# Patient Record
Sex: Male | Born: 1970 | Race: Black or African American | Hispanic: No | Marital: Single | Smoking: Never smoker
Health system: Southern US, Community
[De-identification: ages and names within clinical notes are randomized; demographics above are authoritative.]

## PROBLEM LIST (undated history)

## (undated) DIAGNOSIS — I10 Essential (primary) hypertension: Secondary | ICD-10-CM

## (undated) DIAGNOSIS — M1990 Unspecified osteoarthritis, unspecified site: Secondary | ICD-10-CM

## (undated) HISTORY — PX: OTHER SURGICAL HISTORY: SHX169

---

## 2017-10-18 ENCOUNTER — Ambulatory Visit: Payer: Self-pay | Admitting: Nurse Practitioner

## 2017-11-22 ENCOUNTER — Ambulatory Visit: Payer: Medicaid Other | Attending: Family Medicine | Admitting: Family Medicine

## 2017-11-22 ENCOUNTER — Encounter: Payer: Self-pay | Admitting: Family Medicine

## 2017-11-22 ENCOUNTER — Telehealth: Payer: Self-pay | Admitting: Family Medicine

## 2017-11-22 VITALS — BP 142/84 | HR 74 | Temp 97.5°F | Wt 155.6 lb

## 2017-11-22 DIAGNOSIS — M25521 Pain in right elbow: Secondary | ICD-10-CM

## 2017-11-22 DIAGNOSIS — Z1159 Encounter for screening for other viral diseases: Secondary | ICD-10-CM | POA: Diagnosis not present

## 2017-11-22 DIAGNOSIS — Z13228 Encounter for screening for other metabolic disorders: Secondary | ICD-10-CM | POA: Diagnosis not present

## 2017-11-22 DIAGNOSIS — M25522 Pain in left elbow: Secondary | ICD-10-CM

## 2017-11-22 DIAGNOSIS — M25551 Pain in right hip: Secondary | ICD-10-CM | POA: Diagnosis not present

## 2017-11-22 MED ORDER — MELOXICAM 7.5 MG PO TABS
7.5000 mg | ORAL_TABLET | Freq: Every day | ORAL | 1 refills | Status: DC
Start: 2017-11-22 — End: 2017-12-30

## 2017-11-22 MED FILL — MELOXICAM 7.5 MG TABLET: 7.5 | 30 days supply | Qty: 30 | Fill #0

## 2017-11-22 NOTE — Patient Instructions (Signed)

## 2017-11-23 ENCOUNTER — Encounter: Payer: Self-pay | Admitting: Family Medicine

## 2017-11-23 LAB — HIV ANTIBODY (ROUTINE TESTING W REFLEX): HIV Screen 4th Generation wRfx: NONREACTIVE

## 2017-11-23 LAB — CMP14+EGFR
ALBUMIN: 3.6 g/dL (ref 3.5–5.5)
ALT: 122 IU/L — AB (ref 0–44)
AST: 180 IU/L — ABNORMAL HIGH (ref 0–40)
Albumin/Globulin Ratio: 0.9 — ABNORMAL LOW (ref 1.2–2.2)
Alkaline Phosphatase: 136 IU/L — ABNORMAL HIGH (ref 39–117)
BUN/Creatinine Ratio: 9 (ref 9–20)
BUN: 15 mg/dL (ref 6–24)
Bilirubin Total: 0.4 mg/dL (ref 0.0–1.2)
CALCIUM: 9.3 mg/dL (ref 8.7–10.2)
CHLORIDE: 106 mmol/L (ref 96–106)
CO2: 21 mmol/L (ref 20–29)
Creatinine, Ser: 1.58 mg/dL — ABNORMAL HIGH (ref 0.76–1.27)
GFR calc non Af Amer: 51 mL/min/{1.73_m2} — ABNORMAL LOW (ref >59)
GFR, EST AFRICAN AMERICAN: 59 mL/min/{1.73_m2} — AB (ref >59)
GLUCOSE: 80 mg/dL (ref 65–99)
Globulin, Total: 3.9 g/dL (ref 1.5–4.5)
POTASSIUM: 4.1 mmol/L (ref 3.5–5.2)
Sodium: 142 mmol/L (ref 134–144)
TOTAL PROTEIN: 7.5 g/dL (ref 6.0–8.5)

## 2017-11-23 LAB — CBC WITH DIFFERENTIAL/PLATELET
BASOS: 1 %
Basophils Absolute: 0.1 10*3/uL (ref 0.0–0.2)
EOS (ABSOLUTE): 0.1 10*3/uL (ref 0.0–0.4)
Eos: 3 %
Hematocrit: 37.2 % — ABNORMAL LOW (ref 37.5–51.0)
Hemoglobin: 12.3 g/dL — ABNORMAL LOW (ref 13.0–17.7)
IMMATURE GRANULOCYTES: 0 %
Immature Grans (Abs): 0 10*3/uL (ref 0.0–0.1)
Lymphocytes Absolute: 2.6 10*3/uL (ref 0.7–3.1)
Lymphs: 55 %
MCH: 33.7 pg — AB (ref 26.6–33.0)
MCHC: 33.1 g/dL (ref 31.5–35.7)
MCV: 102 fL — ABNORMAL HIGH (ref 79–97)
MONOS ABS: 0.4 10*3/uL (ref 0.1–0.9)
Monocytes: 9 %
NEUTROS PCT: 32 %
Neutrophils Absolute: 1.5 10*3/uL (ref 1.4–7.0)
PLATELETS: 143 10*3/uL — AB (ref 150–450)
RBC: 3.65 x10E6/uL — ABNORMAL LOW (ref 4.14–5.80)
RDW: 14.8 % (ref 12.3–15.4)
WBC: 4.6 10*3/uL (ref 3.4–10.8)

## 2017-11-23 LAB — SEDIMENTATION RATE: SED RATE: 36 mm/h — AB (ref 0–15)

## 2017-11-23 LAB — URIC ACID: Uric Acid: 13.3 mg/dL — ABNORMAL HIGH (ref 3.7–8.6)

## 2017-11-23 LAB — C-REACTIVE PROTEIN: CRP: <1 mg/L (ref 0–10)

## 2017-11-23 MED ORDER — ALLOPURINOL 300 MG PO TABS: 300.0000 mg | ORAL_TABLET | Freq: Every day | ORAL | 6 refills | Status: DC

## 2017-11-23 NOTE — Progress Notes (Signed)
   Subjective:  Patient ID: Brandon Arias, male    DOB: 02-16-1971  Age: 47 y.o. MRN: [id number]  CC: New Patient (Initial Visit)   HPI Brandon Arias is a 47 year old male who presents today to establish care and is seen with the aid of a [place] video interpreter.  He complains of a 1 year history of bilateral elbow pain and right hip pain which has progressively worsened.  Pain is rated at a 7/10 and he informs me he previously had x-rays but forgot the report at home.  Denies a previous history of trauma, has no fevers.  History reviewed. No pertinent past medical history.  Past Surgical History:  Procedure Laterality Date  . no known surgeries      No Known Allergies  No outpatient medications prior to visit.   No facility-administered medications prior to visit.     ROS Review of Systems  Constitutional: Negative for activity change and appetite change.  HENT: Negative for sinus pressure and sore throat.   Eyes: Negative for visual disturbance.  Respiratory: Negative for cough, chest tightness and shortness of breath.   Cardiovascular: Negative for chest pain and leg swelling.  Gastrointestinal: Negative for abdominal distention, abdominal pain, constipation and diarrhea.  Endocrine: Negative.   Genitourinary: Negative for dysuria.  Musculoskeletal: Negative for joint swelling and myalgias.       See HPI  Skin: Negative for rash.  Allergic/Immunologic: Negative.   Neurological: Negative for weakness, light-headedness and numbness.  Psychiatric/Behavioral: Negative for dysphoric mood and suicidal ideas.    Objective:  BP (!) 142/84   Pulse 74   Temp (!) 97.5 F (36.4 C) (Oral)   Wt 155 lb 9.6 oz (70.6 kg)   SpO2 98%   BP/Weight 6/64/4034  Systolic BP 742  Diastolic BP 84  Wt. (Lbs) 155.6     Physical Exam  Constitutional: He is oriented to person, place, and time. He appears well-developed and well-nourished.  Cardiovascular: Normal rate, normal heart  sounds and intact distal pulses.  No murmur heard. Pulmonary/Chest: Effort normal and breath sounds normal. He has no wheezes. He has no rales. He exhibits no tenderness.  Abdominal: Soft. Bowel sounds are normal. He exhibits no distension and no mass. There is no tenderness.  Musculoskeletal: He exhibits tenderness (TTp on palpaton and ROM of posterior R hip joint).  Slightly tender bony protrusions of olecranon process with evidence of bursitis on left elbow  Neurological: He is alert and oriented to person, place, and time.     Assessment & Plan:   1. Screening for metabolic disorder - [id number]  2. Screening for viral disease - HIV antibody (with reflex)  3. Arthralgia of both elbows We will place him NSAID Advised to bring in report of previous x-ray - CBC with Differential/Platelet - Uric Acid - C-reactive protein - Sedimentation Rate  4. Right hip pain Possible underlying osteoarthritis We will await results of prior x-ray   Meds ordered this encounter  Medications  . meloxicam (MOBIC) 7.5 MG tablet    Sig: Take 1 tablet (7.5 mg total) by mouth daily.    Dispense:  30 tablet    Refill:  1    Follow-up: Return in about 1 month (around 12/23/2017).   Charlott Rakes MD

## 2017-11-23 NOTE — Addendum Note (Signed)
Addended by: Paschal DoppWHITE, VANESSA J on: 11/23/2017 08:52 AM   Modules accepted: Orders

## 2017-11-24 LAB — HEPATITIS PANEL, ACUTE
HEP A IGM: NEGATIVE
HEP B S AG: NEGATIVE
HEP C VIRUS AB: 0.5 {s_co_ratio} (ref 0.0–0.9)
Hep B C IgM: NEGATIVE

## 2017-11-24 LAB — SPECIMEN STATUS REPORT

## 2017-11-24 LAB — GAMMA GT: GGT: 506 IU/L — ABNORMAL HIGH (ref 0–65)

## 2017-11-25 ENCOUNTER — Ambulatory Visit: Payer: Self-pay | Admitting: Internal Medicine

## 2017-11-26 ENCOUNTER — Telehealth: Payer: Self-pay

## 2017-11-26 NOTE — Telephone Encounter (Signed)
Patient was called and informed of lab results and medication being sent to pharmacy.[phone])

## 2017-12-30 ENCOUNTER — Encounter: Payer: Self-pay | Admitting: Family Medicine

## 2017-12-30 ENCOUNTER — Ambulatory Visit: Payer: Medicaid Other | Attending: Family Medicine | Admitting: Family Medicine

## 2017-12-30 VITALS — BP 137/86 | HR 65 | Temp 97.6°F | Wt 155.2 lb

## 2017-12-30 DIAGNOSIS — M109 Gout, unspecified: Secondary | ICD-10-CM | POA: Insufficient documentation

## 2017-12-30 DIAGNOSIS — M1A0291 Idiopathic chronic gout, unspecified elbow, with tophus (tophi): Secondary | ICD-10-CM | POA: Diagnosis not present

## 2017-12-30 DIAGNOSIS — H538 Other visual disturbances: Secondary | ICD-10-CM | POA: Insufficient documentation

## 2017-12-30 DIAGNOSIS — M25521 Pain in right elbow: Secondary | ICD-10-CM | POA: Diagnosis present

## 2017-12-30 DIAGNOSIS — H539 Unspecified visual disturbance: Secondary | ICD-10-CM | POA: Diagnosis not present

## 2017-12-30 HISTORY — DX: Gout, unspecified: M10.9

## 2017-12-30 MED ORDER — ALLOPURINOL 300 MG PO TABS: 300.0000 mg | ORAL_TABLET | Freq: Every day | ORAL | 3 refills | Status: DC

## 2017-12-30 MED ORDER — MELOXICAM 7.5 MG PO TABS: 7.5000 mg | ORAL_TABLET | Freq: Every day | ORAL | 1 refills | Status: DC

## 2017-12-30 MED FILL — ALLOPURINOL 300 MG TAB: 300 | 30 days supply | Qty: 30 | Fill #0

## 2017-12-30 MED FILL — MELOXICAM 7.5 MG TABLET: 7.5 | 30 days supply | Qty: 30 | Fill #1

## 2017-12-30 NOTE — Patient Instructions (Addendum)
Gout Gout is painful swelling that can happen in some of your joints. Gout is a type of arthritis. This condition is caused by having too much uric acid in your body. Uric acid is a chemical that is made when your body breaks down substances called purines. If your body has too much uric acid, sharp crystals can form and build up in your joints. This causes pain and swelling. Gout attacks can happen quickly and be very painful (acute gout). Over time, the attacks can affect more joints and happen more often (chronic gout). Follow these instructions at home: During a Gout Attack  If directed, put ice on the painful area: ? Put ice in a plastic bag. ? Place a towel between your skin and the bag. ? Leave the ice on for 20 minutes, 2-3 times a day.  Rest the joint as much as possible. If the joint is in your leg, you may be given crutches to use.  Raise (elevate) the painful joint above the level of your heart as often as you can.  Drink enough fluids to keep your pee (urine) clear or pale yellow.  Take over-the-counter and prescription medicines only as told by your doctor.  Do not drive or use heavy machinery while taking prescription pain medicine.  Follow instructions from your doctor about what you can or cannot eat and drink.  Return to your normal activities as told by your doctor. Ask your doctor what activities are safe for you. Avoiding Future Gout Attacks  Follow a low-purine diet as told by a specialist (dietitian) or your doctor. Avoid foods and drinks that have a lot of purines, such as: ? Liver. ? Kidney. ? Anchovies. ? Asparagus. ? Herring. ? Mushrooms ? Mussels. ? Beer.  Limit alcohol intake to no more than 1 drink a day for nonpregnant women and 2 drinks a day for men. One drink equals 12 oz of beer, 5 oz of wine, or 1 oz of hard liquor.  Stay at a healthy weight or lose weight if you are overweight. If you want to lose weight, talk with your doctor. It is  important that you do not lose weight too fast.  Start or continue an exercise plan as told by your doctor.  Drink enough fluids to keep your pee clear or pale yellow.  Take over-the-counter and prescription medicines only as told by your doctor.  Keep all follow-up visits as told by your doctor. This is important. Contact a doctor if:  You have another gout attack.  You still have symptoms of a gout attack after10 days of treatment.  You have problems (side effects) because of your medicines.  You have chills or a fever.  You have burning pain when you pee (urinate).  You have pain in your lower back or belly. Get help right away if:  You have very bad pain.  Your pain cannot be controlled.  You cannot pee. This information is not intended to replace advice given to you by your health care provider. Make sure you discuss any questions you have with your health care provider. Document Released: 01/21/2008 Document Revised: 09/19/2015 Document Reviewed: 01/24/2015 Elsevier Interactive Patient Education  2018 Elsevier Inc. Low-Purine Diet Purines are compounds that affect the level of uric acid in your body. A low-purine diet is a diet that is low in purines. Eating a low-purine diet can prevent the level of uric acid in your body from getting too high and causing gout or kidney stones   or both. What do I need to know about this diet?  Choose low-purine foods. Examples of low-purine foods are listed in the next section.  Drink plenty of fluids, especially water. Fluids can help remove uric acid from your body. Try to drink 8-16 cups (1.9-3.8 L) a day.  Limit foods high in fat, especially saturated fat, as fat makes it harder for the body to get rid of uric acid. Foods high in saturated fat include pizza, cheese, ice cream, whole milk, fried foods, and gravies. Choose foods that are lower in fat and lean sources of protein. Use olive oil when cooking as it contains healthy fats  that are not high in saturated fat.  Limit alcohol. Alcohol interferes with the elimination of uric acid from your body. If you are having a gout attack, avoid all alcohol.  Keep in mind that different people's bodies react differently to different foods. You will probably learn over time which foods do or do not affect you. If you discover that a food tends to cause your gout to flare up, avoid eating that food. You can more freely enjoy foods that do not cause problems. If you have any questions about a food item, talk to your dietitian or health care provider. Which foods are low, moderate, and high in purines? The following is a list of foods that are low, moderate, and high in purines. You can eat any amount of the foods that are low in purines. You may be able to have small amounts of foods that are moderate in purines. Ask your health care provider how much of a food moderate in purines you can have. Avoid foods high in purines. Grains  Foods low in purines: Enriched white bread, pasta, rice, cake, cornbread, popcorn.  Foods moderate in purines: Whole-grain breads and cereals, wheat germ, bran, oatmeal. Uncooked oatmeal. Dry wheat bran or wheat germ.  Foods high in purines: Pancakes, French toast, biscuits, muffins. Vegetables  Foods low in purines: All vegetables, except those that are moderate in purines.  Foods moderate in purines: Asparagus, cauliflower, spinach, mushrooms, green peas. Fruits  All fruits are low in purines. Meats and other Protein Foods  Foods low in purines: Eggs, nuts, peanut butter.  Foods moderate in purines: 80-90% lean beef, lamb, veal, pork, poultry, fish, eggs, peanut butter, nuts. Crab, lobster, oysters, and shrimp. Cooked dried beans, peas, and lentils.  Foods high in purines: Anchovies, sardines, herring, mussels, tuna, codfish, scallops, trout, and haddock. Bacon. Organ meats (such as liver or kidney). Tripe. Game meat. Goose.  Sweetbreads. Dairy  All dairy foods are low in purines. Low-fat and fat-free dairy products are best because they are low in saturated fat. Beverages  Drinks low in purines: Water, carbonated beverages, tea, coffee, cocoa.  Drinks moderate in purines: Soft drinks and other drinks sweetened with high-fructose corn syrup. Juices. To find whether a food or drink is sweetened with high-fructose corn syrup, look at the ingredients list.  Drinks high in purines: Alcoholic beverages (such as beer). Condiments  Foods low in purines: Salt, herbs, olives, pickles, relishes, vinegar.  Foods moderate in purines: Butter, margarine, oils, mayonnaise. Fats and Oils  Foods low in purines: All types, except gravies and sauces made with meat.  Foods high in purines: Gravies and sauces made with meat. Other Foods  Foods low in purines: Sugars, sweets, gelatin. Cake. Soups made without meat.  Foods moderate in purines: Meat-based or fish-based soups, broths, or bouillons. Foods and drinks sweetened with high-fructose   corn syrup.  Foods high in purines: High-fat desserts (such as ice cream, cookies, cakes, pies, doughnuts, and chocolate). Contact your dietitian for more information on foods that are not listed here. This information is not intended to replace advice given to you by your health care provider. Make sure you discuss any questions you have with your health care provider. Document Released: 08/08/2010 Document Revised: 09/19/2015 Document Reviewed: 03/20/2013 Elsevier Interactive Patient Education  2017 Elsevier Inc.  

## 2017-12-30 NOTE — Progress Notes (Signed)
Subjective:  Patient ID: Brandon Arias, male    DOB: [birth year]  Age: 47 y.o. MRN: [id number]  CC: Elbow Pain   HPI Brandon Arias is a 47 year old whom I had seen 5 weeks ago for bilateral elbow pains and labs came back with an elevated uric acid level. He also had elevated LFTs and an elevated GGT. A prescription for Allopurinol had been sent to his pharmacy however he never picked this up but had been taking Meloxicam and complains of persisting elbow pains today described as moderate to severe. He also endorses alcohol consumption about once-twice/week. He  Complains of difficulty with night vision and blurry vision while reading.  History reviewed. No pertinent past medical history.  Past Surgical History:  Procedure Laterality Date  . no known surgeries      No Known Allergies   Outpatient Medications Prior to Visit  Medication Sig Dispense Refill  . allopurinol (ZYLOPRIM) 300 MG tablet Take 1 tablet (300 mg total) by mouth daily. 30 tablet 6  . meloxicam (MOBIC) 7.5 MG tablet Take 1 tablet (7.5 mg total) by mouth daily. 30 tablet 1   No facility-administered medications prior to visit.     ROS Review of Systems  Constitutional: Negative for activity change and appetite change.  HENT: Negative for sinus pressure and sore throat.   Eyes: Negative for visual disturbance.  Respiratory: Negative for cough, chest tightness and shortness of breath.   Cardiovascular: Negative for chest pain and leg swelling.  Gastrointestinal: Negative for abdominal distention, abdominal pain, constipation and diarrhea.  Endocrine: Negative.   Genitourinary: Negative for dysuria.  Musculoskeletal:       See hpi  Skin: Negative for rash.  Allergic/Immunologic: Negative.   Neurological: Negative for weakness, light-headedness and numbness.  Psychiatric/Behavioral: Negative for dysphoric mood and suicidal ideas.    Objective:  BP 137/86   Pulse 65   Temp 97.6 F (36.4 C) (Oral)    Wt 155 lb 3.2 oz (70.4 kg)   SpO2 99%   BP/Weight 12/30/2017 11/22/2017  Systolic BP 137 142  Diastolic BP 86 84  Wt. (Lbs) 155.2 155.6      Physical Exam  Constitutional: He is oriented to person, place, and time. He appears well-developed and well-nourished.  Cardiovascular: Normal rate, normal heart sounds and intact distal pulses.  No murmur heard. Pulmonary/Chest: Effort normal and breath sounds normal. He has no wheezes. He has no rales. He exhibits no tenderness.  Abdominal: Soft. Bowel sounds are normal. He exhibits no distension and no mass. There is no tenderness.  Musculoskeletal:  Tophi of both elbows; TTP b/l  Neurological: He is alert and oriented to person, place, and time.  Skin: Skin is warm and dry.  Psychiatric: He has a normal mood and affect.    Lab Results  Component Value Date   LABURIC 13.3 (H) 11/22/2017   Lab Results  Component Value Date   GGT 506 (H) 11/22/2017     Assessment & Plan:   1. 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  Dispense: 30 tablet; Refill: 3 - meloxicam (MOBIC) 7.5 MG tablet; Take 1 tablet (7.5 mg total) by mouth daily.  Dispense: 30 tablet; Refill: 1 - DG Elbow Complete Left; Future - DG Elbow Complete Right; Future  2.  Vision abnormalities - Ambulatory referral to Ophthalmology   Meds ordered this encounter  Medications  . allopurinol (ZYLOPRIM) 300 MG tablet    Sig: Take 1 tablet (300 mg total) by mouth daily.    Dispense:  30 tablet    Refill:  3  . meloxicam (MOBIC) 7.5 MG tablet    Sig: Take 1 tablet (7.5 mg total) by mouth daily.    Dispense:  30 tablet    Refill:  1    Follow-up: Return in about 6 weeks (around 02/10/2018) for follow Up on gout.   Hoy Register MD

## 2018-01-07 NOTE — Telephone Encounter (Signed)
No additional documentation

## 2018-02-09 ENCOUNTER — Ambulatory Visit: Payer: Medicaid Other | Attending: Family Medicine | Admitting: Physician Assistant

## 2018-02-09 VITALS — BP 128/74 | HR 63 | Temp 97.5°F | Wt 151.6 lb

## 2018-02-09 DIAGNOSIS — M1A0291 Idiopathic chronic gout, unspecified elbow, with tophus (tophi): Secondary | ICD-10-CM

## 2018-02-09 DIAGNOSIS — Z791 Long term (current) use of non-steroidal anti-inflammatories (NSAID): Secondary | ICD-10-CM | POA: Insufficient documentation

## 2018-02-09 DIAGNOSIS — M1A0290 Idiopathic chronic gout, unspecified elbow, without tophus (tophi): Secondary | ICD-10-CM | POA: Diagnosis not present

## 2018-02-09 DIAGNOSIS — M79672 Pain in left foot: Secondary | ICD-10-CM | POA: Diagnosis not present

## 2018-02-09 DIAGNOSIS — Z79899 Other long term (current) drug therapy: Secondary | ICD-10-CM | POA: Diagnosis not present

## 2018-02-09 NOTE — Progress Notes (Signed)
Patient ID: Brandon Arias, male   DOB: 02-16-1971, 47 y.o.   MRN: [id number]     Brandon Arias, is a 47 y.o. male  [id number]  [id number]  DOB - [birth year]  Subjective:  Chief Complaint and HPI: Brandon Arias is a 47 y.o. male here today for L foot pain that is ongoing since 2015.  meds helped some.  Diagnosed with gout.  Pain worse in the mornings.  Being treated as gout.  Pain is bt 4th and 5th metatarsals   Vincent with [employer].  L foot pain.  [employer] worker ROS:   Constitutional:  No f/c, No night sweats, No unexplained weight loss. EENT:  No vision changes, No blurry vision, No hearing changes. No mouth, throat, or ear problems.  Respiratory: No cough, No SOB Cardiac: No CP, no palpitations GI:  No abd pain, No N/V/D. GU: No Urinary s/sx Musculoskeletal: N+L foot pain Neuro: No headache, no dizziness, no motor weakness.  Skin: No rash Endocrine:  No polydipsia. No polyuria.  Psych: Denies SI/HI  No problems updated.  ALLERGIES: No Known Allergies  PAST MEDICAL HISTORY: History reviewed. No pertinent past medical history.  MEDICATIONS AT HOME: Prior to Admission medications   Medication Sig Start Date End Date Taking? Authorizing Provider  allopurinol (ZYLOPRIM) 300 MG tablet Take 1 tablet (300 mg total) by mouth daily. 12/30/17  Yes Hoy Register, MD  meloxicam (MOBIC) 7.5 MG tablet Take 1 tablet (7.5 mg total) by mouth daily. 12/30/17  Yes Hoy Register, MD     Objective:  EXAM:   Vitals:   02/09/18 1420  BP: 128/74  Pulse: 63  Temp: (!) 97.5 F (36.4 C)  TempSrc: Oral  SpO2: 100%  Weight: 151 lb 9.6 oz (68.8 kg)    General appearance : A&OX3. NAD. Non-toxic-appearing HEENT: Atraumatic and Normocephalic.  PERRLA. EOM intact.  Neck: supple, no JVD. No cervical lymphadenopathy. No thyromegaly Chest/Lungs:  Breathing-non-labored, Good air entry bilaterally, breath sounds normal without rales, rhonchi, or wheezing  CVS: S1  S2 regular, no murmurs, gallops, rubs  R and L foot examined.  There is no gross abnormality or erythema.  No visible swelling.  Passive ROM normal w/o pain. There is TTP bt/ with palpation of the distal 4th and 5th metatarsals.   Extremities: Bilateral Lower Ext shows no edema, both legs are warm to touch with = pulse throughout Neurology:  CN II-XII grossly intact, Non focal.   Psych:  TP linear. J/I WNL. Normal speech. Appropriate eye contact and affect.  Skin:  No Rash  Data Review No results found for: HGBA1C   Assessment & Plan   1. Left foot pain ? Neuroma/gout vs other - DG Foot 2 Views Left; Future - Ambulatory referral to Podiatry  2. 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  Use mobic if needed for pain.       Patient have been counseled extensively about nutrition and exercise  Return in about 3 months (around 05/12/2018) for Dr Alvis Lemmings; check up.  The patient was given clear instructions to go to ER or return to medical center if symptoms don't improve, worsen or new problems develop. The patient verbalized understanding. The patient was told to call to get lab results if they haven't heard anything in the next week.     Georgian Co, PA-C [facility] [place], [place] [phone]   02/09/2018, 2:31 PM

## 2018-02-09 NOTE — Patient Instructions (Signed)

## 2018-03-14 ENCOUNTER — Ambulatory Visit: Payer: Medicaid Other | Admitting: Podiatry

## 2018-03-21 ENCOUNTER — Encounter: Payer: Medicaid Other | Admitting: Podiatry

## 2018-03-23 NOTE — Progress Notes (Signed)
This encounter was created in error - please disregard.

## 2018-05-11 ENCOUNTER — Ambulatory Visit: Payer: Medicaid Other

## 2018-08-03 ENCOUNTER — Telehealth: Payer: Self-pay | Admitting: Family Medicine

## 2018-08-03 NOTE — Telephone Encounter (Signed)
He needs a telephone visit with any available Clinician

## 2018-08-03 NOTE — Telephone Encounter (Signed)
Pts son called in stating that pt had a bloody nose and employer wants a note stating that pt can go back to work pt's son states that his father is not sick please follow up

## 2018-08-03 NOTE — Telephone Encounter (Signed)
Will route to PCP for review. 

## 2018-08-04 ENCOUNTER — Encounter (HOSPITAL_COMMUNITY): Payer: Self-pay | Admitting: Emergency Medicine

## 2018-08-04 ENCOUNTER — Emergency Department (HOSPITAL_COMMUNITY)
Admission: EM | Admit: 2018-08-04 | Discharge: 2018-08-04 | Disposition: A | Discharge status: Home / Self Care | Payer: Medicaid Other | Attending: Emergency Medicine | Admitting: Emergency Medicine

## 2018-08-04 ENCOUNTER — Telehealth: Payer: Self-pay | Admitting: Family Medicine

## 2018-08-04 DIAGNOSIS — R040 Epistaxis: Secondary | ICD-10-CM | POA: Insufficient documentation

## 2018-08-04 DIAGNOSIS — Z79899 Other long term (current) drug therapy: Secondary | ICD-10-CM | POA: Insufficient documentation

## 2018-08-04 MED ORDER — OXYMETAZOLINE HCL 0.05 % NA SOLN
1.0000 | Freq: Once | NASAL | Status: AC
  Administered 2018-08-04: 14:00:00 1 via NASAL
  Filled 2018-08-04: qty 30

## 2018-08-04 NOTE — Telephone Encounter (Signed)
Can you set this patient up with a phone visit with any provider.

## 2018-08-04 NOTE — Telephone Encounter (Signed)
Patients call taken.  Patient identified by name and date of birth.  Patient states that he has had a bloody nose since Monday.  His employer sent him home and patient stated he needed a Doctors note to return to work.  Patient advised to go  To Urgent Care or the Emergency Department to be assessed.

## 2018-08-04 NOTE — ED Triage Notes (Signed)
Pt started having a nose bleed today at 1245. EMS gave afrin to help slow the bleed. Pt holding pressure, states he still feels it going down his throat. Pt had a recent nose bleed a few days ago as well. Complains of HA. BP 170/100, HR 70, resp 16

## 2018-08-04 NOTE — Discharge Instructions (Signed)
Your history and exam today are consistent with a nosebleed likely due to all the seasonal pollens and allergies.  Your nosebleed was able to stop after Afrin and pressure.  He did not need other intervention.  Please follow-up with ear nose and throat doctors if this nosebleed recurs and please return to the emergency department if it continues to bleed and does not stop.  Your vital signs are reassuring and otherwise your exam was unremarkable.

## 2018-08-04 NOTE — ED Notes (Signed)
No further bleeding noted after pt used afrin spray

## 2018-08-04 NOTE — ED Provider Notes (Signed)
[facility] Provider Note   CSN: [id number] Arrival date & time: 08/04/18  1357    History   Chief Complaint Chief Complaint  Patient presents with  . Epistaxis    HPI Brandon Arias is a 48 y.o. male.     The history is provided by the patient and medical records. The history is limited by a language barrier. A language interpreter was used (french).  Epistaxis  Location:  R nare Severity:  Moderate Duration:  1 day Timing:  Intermittent Progression:  Improving Chronicity:  Recurrent Context: weather change   Context: not anticoagulants   Relieved by:  Nothing Worsened by:  Nothing Ineffective treatments:  None tried Associated symptoms: no congestion, no cough, no fever, no headaches, no sinus pain, no sneezing, no sore throat and no syncope   Risk factors: allergies   Risk factors: no frequent nosebleeds     No past medical history on file.  Patient Active Problem List   Diagnosis Date Noted  . Gout 12/30/2017    Past Surgical History:  Procedure Laterality Date  . no known surgeries          Home Medications    Prior to Admission medications   Medication Sig Start Date End Date Taking? Authorizing Provider  allopurinol (ZYLOPRIM) 300 MG tablet Take 1 tablet (300 mg total) by mouth daily. 12/30/17   Hoy RegisterNewlin, Enobong, MD  meloxicam (MOBIC) 7.5 MG tablet Take 1 tablet (7.5 mg total) by mouth daily. 12/30/17   Hoy RegisterNewlin, Enobong, MD    Family History No family history on file.  Social History Social History   Tobacco Use  . Smoking status: Never Smoker  . Smokeless tobacco: Never Used  Substance Use Topics  . Alcohol use: Never    Frequency: Never  . Drug use: Never     Allergies   Patient has no known allergies.   Review of Systems Review of Systems  Constitutional: Negative for chills, diaphoresis, fatigue and fever.  HENT: Positive for nosebleeds. Negative for congestion, sinus pain, sneezing, sore  throat, tinnitus, trouble swallowing and voice change.   Eyes: Negative for visual disturbance.  Respiratory: Negative for cough.   Cardiovascular: Negative for chest pain and syncope.  Gastrointestinal: Negative for abdominal pain.  Genitourinary: Negative for flank pain.  Musculoskeletal: Negative for back pain, neck pain and neck stiffness.  Neurological: Negative for syncope, light-headedness and headaches.  All other systems reviewed and are negative.    Physical Exam Updated Vital Signs BP (!) 162/104 (BP Location: Right Arm)   Temp 99.3 F (37.4 C) (Oral)   Resp 16   SpO2 100%   Physical Exam Vitals signs and nursing note reviewed.  Constitutional:      General: He is not in acute distress.    Appearance: He is not ill-appearing, toxic-appearing or diaphoretic.  HENT:     Head:      Nose: No nasal deformity, septal deviation, laceration, congestion or rhinorrhea.     Right Nostril: Epistaxis present. No septal hematoma or occlusion.     Left Nostril: No epistaxis, septal hematoma or occlusion.     Mouth/Throat:     Pharynx: No posterior oropharyngeal erythema.     Comments: Blood in posterior oropharynx on arrival. Resolved on reassessment  Eyes:     Extraocular Movements: Extraocular movements intact.     Conjunctiva/sclera: Conjunctivae normal.     Pupils: Pupils are equal, round, and reactive to light.  Neck:  Musculoskeletal: Normal range of motion. No muscular tenderness.  Cardiovascular:     Rate and Rhythm: Normal rate.     Pulses: Normal pulses.     Heart sounds: No murmur.  Pulmonary:     Effort: Pulmonary effort is normal. No respiratory distress.     Breath sounds: No stridor. No wheezing, rhonchi or rales.  Chest:     Chest wall: No tenderness.  Abdominal:     General: Abdomen is flat. There is no distension.  Musculoskeletal:        General: No tenderness.  Skin:    Capillary Refill: Capillary refill takes less than 2 seconds.      Findings: No erythema or rash.  Neurological:     Mental Status: He is alert.      ED Treatments / Results  Labs (all labs ordered are listed, but only abnormal results are displayed) Labs Reviewed - No data to display  EKG None  Radiology No results found.  Procedures Procedures (including critical care time)  Medications Ordered in ED Medications  oxymetazoline (AFRIN) 0.05 % nasal spray 1 spray (1 spray Each Nare Given 08/04/18 1413)     Initial Impression / Assessment and Plan / ED Course  I have reviewed the triage vital signs and the nursing notes.  Pertinent labs & imaging results that were available during my care of the patient were reviewed by me and considered in my medical decision making (see chart for details).        Brandon Arias is a 48 y.o. male with no significant past medical history who presents with epistaxis.  Patient reports that he had recurrent epistaxis today after having several days ago.  He reports he has been having some runny nose as well.  He denies significant fevers, chills congestion, cough.  Denies recent trauma.  Denies any chest pain or shortness of breath.  No other complaints.  He denies any lightheadedness, syncope, palpitations.  He has never had significant nosebleeds in the past.  On exam, patient had mild epistaxis from his right nare.  Patient blew his nose and I was able to inspect his nose bilaterally.  There is no evidence of anterior nosebleed.  Suspect right posterior bleed as there was some blood going down the back of his throat.  Lungs clear chest nontender.  No stridor.  Patient otherwise reassuring on exam.  Patient had Afrin and pressure utilized.  Epistaxis resolved without needing for packing and he had no further bleeding during.  Observation.  Blood pressure had improved and patient was resting comfortably with no further bleeding.  He was able to ambulate and walk around without return of epistaxis.  Suspect  epistaxis related to blowing nose in the setting of allergies and seasonal changes.  Patient instructed to follow-up with otolaryngology if symptoms recur and persist.  If bleeding does not stop at home and it recurs, he is instructed to return to emergency department.  At this time do not feel patient is packing or other intervention.  Patient resting comfortably.  A [place] interpreter was used for interactions.  Patient agreed with plan of care and was discharged in good condition.    Final Clinical Impressions(s) / ED Diagnoses   Final diagnoses:  Right-sided epistaxis    ED Discharge Orders    None      Clinical Impression: 1. Right-sided epistaxis     Disposition: Discharge  Condition: Good  I have discussed the results, Dx and Tx plan with  the pt(& family if present). He/she/they expressed understanding and agree(s) with the plan. Discharge instructions discussed at great length. Strict return precautions discussed and pt &/or family have verbalized understanding of the instructions. No further questions at time of discharge.    New Prescriptions   No medications on file    Follow Up: Christia Reading, MD [id number] [place] [address] [phone]        Rashia Mckesson, Canary Brim, MD 08/04/18 [phone]

## 2018-08-04 NOTE — Telephone Encounter (Signed)
Patient called stating his nose is bleeding and he needs a letter to go back to work. Patient was advised to go to the ED or UC please follow up.

## 2018-08-08 ENCOUNTER — Ambulatory Visit: Payer: Medicaid Other | Attending: Primary Care | Admitting: Primary Care

## 2018-08-08 ENCOUNTER — Other Ambulatory Visit: Payer: Self-pay

## 2018-08-08 DIAGNOSIS — Z760 Encounter for issue of repeat prescription: Secondary | ICD-10-CM | POA: Diagnosis not present

## 2018-08-08 DIAGNOSIS — R040 Epistaxis: Secondary | ICD-10-CM | POA: Diagnosis present

## 2018-08-08 DIAGNOSIS — J302 Other seasonal allergic rhinitis: Secondary | ICD-10-CM | POA: Diagnosis not present

## 2018-08-08 DIAGNOSIS — R030 Elevated blood-pressure reading, without diagnosis of hypertension: Secondary | ICD-10-CM | POA: Diagnosis not present

## 2018-08-08 MED ORDER — SODIUM CHLORIDE 0.9 % IN AERS: 1.0000 | INHALATION_SPRAY | RESPIRATORY_TRACT | 12 refills | Status: DC | PRN

## 2018-08-08 MED ORDER — LORATADINE 10 MG PO TABS: 10.0000 mg | ORAL_TABLET | Freq: Every day | ORAL | 11 refills | Status: DC

## 2018-08-08 NOTE — Progress Notes (Signed)
Bloody nose/ started March and intermittent/ yesterday was the most recent/it's a lot and it fills the paper toilet/blocks it for 30-45 min and stops.   Pain: 0

## 2018-08-08 NOTE — Progress Notes (Signed)
Acute Office Visit  Subjective:    Patient ID: Brandon Arias, male    DOB: 08-10-1970, 48 y.o.   MRN: [id number]  Chief Complaint  Patient presents with  . Epistaxis  . Medication Refill    HPI Patient is having a tele health visit due COVID-19. Seen in ED for epistaxis.   No past medical history on file.  Past Surgical History:  Procedure Laterality Date  . no known surgeries      No family history on file.  Social History   Socioeconomic History  . Marital status: Married    Spouse name: Not on file  . Number of children: Not on file  . Years of education: Not on file  . Highest education level: Not on file  Occupational History  . Not on file  Social Needs  . Financial resource strain: Not on file  . Food insecurity:    Worry: Not on file    Inability: Not on file  . Transportation needs:    Medical: Not on file    Non-medical: Not on file  Tobacco Use  . Smoking status: Never Smoker  . Smokeless tobacco: Never Used  Substance and Sexual Activity  . Alcohol use: Yes    Frequency: Never    Comment: occ  . Drug use: Never  . Sexual activity: Not on file  Lifestyle  . Physical activity:    Days per week: Not on file    Minutes per session: Not on file  . Stress: Not on file  Relationships  . Social connections:    Talks on phone: Not on file    Gets together: Not on file    Attends religious service: Not on file    Active member of club or organization: Not on file    Attends meetings of clubs or organizations: Not on file    Relationship status: Not on file  . Intimate partner violence:    Fear of current or ex partner: Not on file    Emotionally abused: Not on file    Physically abused: Not on file    Forced sexual activity: Not on file  Other Topics Concern  . Not on file  Social History Narrative  . Not on file    Outpatient Medications Prior to Visit  Medication Sig Dispense Refill  . ibuprofen (ADVIL,MOTRIN) 200 MG tablet Take  200 mg by mouth every 6 (six) hours as needed.    Marland Kitchen allopurinol (ZYLOPRIM) 300 MG tablet Take 1 tablet (300 mg total) by mouth daily. (Patient not taking: Reported on 08/08/2018) 30 tablet 3  . meloxicam (MOBIC) 7.5 MG tablet Take 1 tablet (7.5 mg total) by mouth daily. (Patient not taking: Reported on 08/08/2018) 30 tablet 1   No facility-administered medications prior to visit.     No Known Allergies  Review of Systems  HENT: Positive for nosebleeds.   Eyes: Negative.   Respiratory: Positive for cough.   Cardiovascular: Negative.   Gastrointestinal: Negative.   Genitourinary: Negative.   Musculoskeletal: Negative.   Neurological: Negative.   Endo/Heme/Allergies: Negative.   Psychiatric/Behavioral: Negative.        Objective:    Physical Exam  There were no vitals taken for this visit. Wt Readings from Last 3 Encounters:  02/09/18 151 lb 9.6 oz (68.8 kg)  12/30/17 155 lb 3.2 oz (70.4 kg)  11/22/17 155 lb 9.6 oz (70.6 kg)    There are no preventive care reminders to display for this patient.  There are no preventive care reminders to display for this patient.   No results found for: TSH Lab Results  Component Value Date   WBC 4.6 11/22/2017   HGB 12.3 (L) 11/22/2017   HCT 37.2 (L) 11/22/2017   MCV 102 (H) 11/22/2017   PLT 143 (L) 11/22/2017   Lab Results  Component Value Date   NA 142 11/22/2017   K 4.1 11/22/2017   CO2 21 11/22/2017   GLUCOSE 80 11/22/2017   BUN 15 11/22/2017   CREATININE 1.58 (H) 11/22/2017   BILITOT 0.4 11/22/2017   ALKPHOS 136 (H) 11/22/2017   AST 180 (H) 11/22/2017   ALT 122 (H) 11/22/2017   PROT 7.5 11/22/2017   ALBUMIN 3.6 11/22/2017   CALCIUM 9.3 11/22/2017   No results found for: CHOL No results found for: HDL No results found for: LDLCALC No results found for: TRIG No results found for: CHOLHDL No results found for: [id number]     Assessment & Plan:   Problem List Items Addressed This Visit    None    Cheryll Cockaynedouard was seen  today for epistaxis and medication refill.  Diagnoses and all orders for this visit:  Seasonal allergies -     loratadine (CLARITIN) 10 MG tablet; Take 1 tablet (10 mg total) by mouth daily. -     sodium chloride (BRONCHO SALINE) inhaler solution; Take 1 spray by nebulization as needed.  Epistaxis  Several episodes started from March went to ED . Bp slightly elevated.  Elevated blood pressure reading without diagnosis of hypertension BP from ED notes 162/104   Patient requesting rtn to work from ED visit till today visit rtn on Wednesday   No orders of the defined types were placed in this encounter.    Grayce SessionsMichelle P Georgiana Spillane, NP

## 2018-08-11 ENCOUNTER — Telehealth: Payer: Self-pay | Admitting: Family Medicine

## 2018-08-11 NOTE — Telephone Encounter (Signed)
Patient called to inform provider that medications are not helping him, patient states he is still having bloody noses. Please follow up.

## 2018-08-11 NOTE — Telephone Encounter (Signed)
Patient called again requesting a letter stating that he is excused from work for 04/16/,04/17 and 04/20. Please follow up.

## 2018-08-12 ENCOUNTER — Other Ambulatory Visit: Payer: Self-pay | Admitting: Family Medicine

## 2018-08-12 NOTE — Progress Notes (Signed)
I have written a letter for him. Thanks.

## 2018-08-12 NOTE — Telephone Encounter (Signed)
I will provide his letter when I am back in the office. Advise to continue current nasal spray and apply pressure to the nasal bridge to stop bleeding.

## 2018-08-15 ENCOUNTER — Encounter: Payer: Self-pay | Admitting: *Deleted

## 2018-08-15 NOTE — Telephone Encounter (Signed)
Left message on voicemail by assist of  [employer], [place] # [phone], that letter is at front desk. May pick up at convenience.

## 2018-12-23 ENCOUNTER — Other Ambulatory Visit: Payer: Self-pay | Admitting: Podiatry

## 2018-12-23 ENCOUNTER — Other Ambulatory Visit: Payer: Self-pay

## 2018-12-23 ENCOUNTER — Ambulatory Visit (INDEPENDENT_AMBULATORY_CARE_PROVIDER_SITE_OTHER): Payer: Medicaid Other | Admitting: Podiatry

## 2018-12-23 ENCOUNTER — Encounter: Payer: Self-pay | Admitting: Podiatry

## 2018-12-23 ENCOUNTER — Ambulatory Visit (INDEPENDENT_AMBULATORY_CARE_PROVIDER_SITE_OTHER): Payer: Medicaid Other

## 2018-12-23 VITALS — BP 149/83

## 2018-12-23 DIAGNOSIS — M79672 Pain in left foot: Secondary | ICD-10-CM

## 2018-12-23 DIAGNOSIS — M79671 Pain in right foot: Secondary | ICD-10-CM

## 2018-12-23 DIAGNOSIS — M7751 Other enthesopathy of right foot: Secondary | ICD-10-CM

## 2018-12-23 DIAGNOSIS — M7752 Other enthesopathy of left foot: Secondary | ICD-10-CM | POA: Diagnosis not present

## 2018-12-23 DIAGNOSIS — M779 Enthesopathy, unspecified: Secondary | ICD-10-CM

## 2018-12-23 MED ORDER — DICLOFENAC SODIUM 75 MG PO TBEC: 75.0000 mg | DELAYED_RELEASE_TABLET | Freq: Two times a day (BID) | ORAL | 2 refills | Status: DC

## 2018-12-23 MED FILL — DICLOFENAC SOD EC 75 MG TAB: 75 | 25 days supply | Qty: 50 | Fill #0

## 2018-12-26 NOTE — Progress Notes (Signed)
Subjective:   Patient ID: Brandon Arias, male   DOB: 48 y.o.   MRN: [id number]   HPI Patient presents with interpreter with chronic discomfort in the ankle region bilateral and does work a job where he stands all day and states this is been going on for a while.  Patient does not smoke likes to be active   Review of Systems  All other systems reviewed and are negative.       Objective:  Physical Exam Vitals signs and nursing note reviewed.  Constitutional:      Appearance: He is well-developed.  Pulmonary:     Effort: Pulmonary effort is normal.  Musculoskeletal: Normal range of motion.  Skin:    General: Skin is warm.  Neurological:     Mental Status: He is alert.     Neurovascular status found to be intact muscle strength found to be adequate range of motion was adequate patient found to have quite a bit of discomfort in the sinus tarsi bilateral with inflammation fluid around the area and mild discomfort on the inside of the ankle bilateral     Assessment:  Inflammatory capsulitis of the sinus tarsi bilateral with fluid buildup     Plan:  Did sterile prep bilateral and injected the sinus tarsi bilateral 3 mg Kenalog 5 mg Xylocaine and instructed on physical therapy anti-inflammatory supportive therapy and will be seen back on an as-needed basis  X-ray indicated that there is good alignment with no signs of significant pathology

## 2019-05-11 ENCOUNTER — Ambulatory Visit: Payer: Medicaid Other | Attending: Family Medicine | Admitting: Internal Medicine

## 2019-05-11 ENCOUNTER — Other Ambulatory Visit: Payer: Self-pay

## 2019-05-11 ENCOUNTER — Encounter: Payer: Self-pay | Admitting: Internal Medicine

## 2019-05-11 VITALS — BP 155/91 | HR 71 | Temp 98.0°F | Resp 16 | Wt 162.0 lb

## 2019-05-11 DIAGNOSIS — M79642 Pain in left hand: Secondary | ICD-10-CM

## 2019-05-11 DIAGNOSIS — Z79899 Other long term (current) drug therapy: Secondary | ICD-10-CM | POA: Diagnosis not present

## 2019-05-11 DIAGNOSIS — M109 Gout, unspecified: Secondary | ICD-10-CM | POA: Insufficient documentation

## 2019-05-11 DIAGNOSIS — Z791 Long term (current) use of non-steroidal anti-inflammatories (NSAID): Secondary | ICD-10-CM | POA: Insufficient documentation

## 2019-05-11 MED ORDER — PREDNISONE 20 MG PO TABS: ORAL_TABLET | ORAL | 0 refills | Status: DC

## 2019-05-11 MED ORDER — DOXYCYCLINE HYCLATE 100 MG PO TABS: 100.0000 mg | ORAL_TABLET | Freq: Two times a day (BID) | ORAL | 0 refills | Status: DC

## 2019-05-11 MED ORDER — AMOXICILLIN 500 MG PO CAPS: 500.0000 mg | ORAL_CAPSULE | Freq: Three times a day (TID) | ORAL | 0 refills | Status: DC

## 2019-05-11 MED FILL — AMOXICILLIN 500 MG CAPSULE: 500 | 7 days supply | Qty: 21 | Fill #0

## 2019-05-11 MED FILL — predniSONE 20 MG TABS: 20 | 8 days supply | Qty: 10 | Fill #0

## 2019-05-11 MED FILL — DOXYCYCLINE HYCLATE 100 MG: 100 | 7 days supply | Qty: 14 | Fill #0

## 2019-05-11 NOTE — Progress Notes (Signed)
Patient ID: Brandon Arias, male    DOB: [birth year]  MRN: [id number]  CC: Edema (left hand )   Subjective: Brandon Arias is a 49 y.o. male who presents for UC visit for swelling and pain LT hand. His concerns today include:   Patient works at a [employer].  He was recently sent home from work and told not to return until he has his LT hand evaluated. - started 6 days ago as pain in he hand then swelling started 2 days later. No fever or redness.  Rates pain 9/10. No known injury. No insect bite.  -takes Ibuprofen which does no help.  He has history of gout.  States he had swelling in his hand sometime last year and was told that it was not gout. Patient Active Problem List   Diagnosis Date Noted  . Gout 12/30/2017     Current Outpatient Medications on File Prior to Visit  Medication Sig Dispense Refill  . allopurinol (ZYLOPRIM) 300 MG tablet Take 1 tablet (300 mg total) by mouth daily. 30 tablet 3  . diclofenac (VOLTAREN) 75 MG EC tablet Take 1 tablet (75 mg total) by mouth 2 (two) times daily. 50 tablet 2  . ibuprofen (ADVIL,MOTRIN) 200 MG tablet Take 200 mg by mouth every 6 (six) hours as needed.    . loratadine (CLARITIN) 10 MG tablet Take 1 tablet (10 mg total) by mouth daily. 30 tablet 11  . meloxicam (MOBIC) 7.5 MG tablet Take 1 tablet (7.5 mg total) by mouth daily. 30 tablet 1  . sodium chloride (BRONCHO SALINE) inhaler solution Take 1 spray by nebulization as needed. 90 mL 12   No current facility-administered medications on file prior to visit.    No Known Allergies  Social History   Socioeconomic History  . Marital status: Married    Spouse name: Not on file  . Number of children: Not on file  . Years of education: Not on file  . Highest education level: Not on file  Occupational History  . Not on file  Tobacco Use  . Smoking status: Never Smoker  . Smokeless tobacco: Never Used  Substance and Sexual Activity  . Alcohol use: Yes   Comment: occ  . Drug use: Never  . Sexual activity: Not on file  Other Topics Concern  . Not on file  Social History Narrative  . Not on file   Social Determinants of Health   Financial Resource Strain:   . Difficulty of Paying Living Expenses: Not on file  Food Insecurity:   . Worried About Charity fundraiser in the Last Year: Not on file  . Ran Out of Food in the Last Year: Not on file  Transportation Needs:   . Lack of Transportation (Medical): Not on file  . Lack of Transportation (Non-Medical): Not on file  Physical Activity:   . Days of Exercise per Week: Not on file  . Minutes of Exercise per Session: Not on file  Stress:   . Feeling of Stress : Not on file  Social Connections:   . Frequency of Communication with Friends and Family: Not on file  . Frequency of Social Gatherings with Friends and Family: Not on file  . Attends Religious Services: Not on file  . Active Member of Clubs or Organizations: Not on file  . Attends Archivist Meetings: Not on file  . Marital Status: Not on file  Intimate Partner Violence:   . Fear of Current  or Ex-Partner: Not on file  . Emotionally Abused: Not on file  . Physically Abused: Not on file  . Sexually Abused: Not on file    No family history on file.  Past Surgical History:  Procedure Laterality Date  . no known surgeries      ROS: Review of Systems Negative except as stated above  PHYSICAL EXAM: BP (!) 155/91   Pulse 71   Temp 98 F (36.7 C)   Resp 16   Wt 162 lb (73.5 kg)   SpO2 99%   Physical Exam General appearance - alert, well appearing, middle age male and in no distress Mental status - normal mood, behavior, speech, dress, motor activity, and thought processes Musculoskeletal - LT hand: Patient noted to have mild to moderate swelling on the dorsal surface.  He has mild swelling in the second through the fourth fingers and moderate swelling of the fifth finger.  Fifth finger is exquisitely tender  to touch and sensitive to passive movement.  There is tenderness on palpation of the dorsal surface of the hand.  There is increased warmth on the dorsal surface.  Erythema hard to detect given patient's skin color.  Mild discomfort on passive range of motion of the wrist.  CMP Latest Ref Rng & Units 11/22/2017  Glucose 65 - 99 mg/dL 80  BUN 6 - 24 mg/dL 15  Creatinine 2.95 - 2.84 mg/dL 1.32(G)  Sodium 401 - 027 mmol/L 142  Potassium 3.5 - 5.2 mmol/L 4.1  Chloride 96 - 106 mmol/L 106  CO2 20 - 29 mmol/L 21  Calcium 8.7 - 10.2 mg/dL 9.3  Total Protein 6.0 - 8.5 g/dL 7.5  Total Bilirubin 0.0 - 1.2 mg/dL 0.4  Alkaline Phos 39 - 117 IU/L 136(H)  AST 0 - 40 IU/L 180(H)  ALT 0 - 44 IU/L 122(H)   Lipid Panel  No results found for: CHOL, TRIG, HDL, CHOLHDL, VLDL, LDLCALC, LDLDIRECT  CBC    Component Value Date/Time   WBC 4.6 11/22/2017 1111   RBC 3.65 (L) 11/22/2017 1111   HGB 12.3 (L) 11/22/2017 1111   HCT 37.2 (L) 11/22/2017 1111   PLT 143 (L) 11/22/2017 1111   MCV 102 (H) 11/22/2017 1111   MCH 33.7 (H) 11/22/2017 1111   MCHC 33.1 11/22/2017 1111   RDW 14.8 11/22/2017 1111   LYMPHSABS 2.6 11/22/2017 1111   EOSABS 0.1 11/22/2017 1111   BASOSABS 0.1 11/22/2017 1111    ASSESSMENT AND PLAN: 1. Pain of left hand Differential diagnosis here is cellulitis versus acute gout flare. I will put him on tapering prednisone and cover him for cellulitis with amoxicillin and doxycycline.  Patient advised to be seen in the emergency room if this is not improving or gets worse despite being on these medications I have filled out the form that he brought from his job.  I recommend that he remain out of work for at least 1 week.  He will follow-up with his PCP in 1 week - predniSONE (DELTASONE) 20 MG tablet; 2 tabs daily x 2 days then 1.5 tab PO daily x 2 days then 1 tab x 2 days then 1/2 tab x 2 days.  Dispense: 10 tablet; Refill: 0 - amoxicillin (AMOXIL) 500 MG capsule; Take 1 capsule (500 mg  total) by mouth 3 (three) times daily.  Dispense: 21 capsule; Refill: 0 - doxycycline (VIBRA-TABS) 100 MG tablet; Take 1 tablet (100 mg total) by mouth 2 (two) times daily.  Dispense: 14 tablet; Refill: 0  Patient was given the opportunity to ask questions.  Patient verbalized understanding of the plan and was able to repeat key elements of the plan.  [employer] interpreter used during this encounter. #[id number]  No orders of the defined types were placed in this encounter.    Requested Prescriptions   Signed Prescriptions Disp Refills  . predniSONE (DELTASONE) 20 MG tablet 10 tablet 0    Sig: 2 tabs daily x 2 days then 1.5 tab PO daily x 2 days then 1 tab x 2 days then 1/2 tab x 2 days.  Marland Kitchen amoxicillin (AMOXIL) 500 MG capsule 21 capsule 0    Sig: Take 1 capsule (500 mg total) by mouth 3 (three) times daily.  Marland Kitchen doxycycline (VIBRA-TABS) 100 MG tablet 14 tablet 0    Sig: Take 1 tablet (100 mg total) by mouth 2 (two) times daily.    No follow-ups on file.  Jonah Blue, MD, FACP

## 2019-05-11 NOTE — Progress Notes (Deleted)
Virtual Visit via Telephone Note  I connected with [name] on 05/11/19 at  9:50 AM EST by telephone and verified that I am speaking with the correct person using two identifiers.   I discussed the limitations, risks, security and privacy concerns of performing an evaluation and management service by telephone and the availability of in person appointments. I also discussed with the patient that there may be a patient responsible charge related to this service. The patient expressed understanding and agreed to proceed.  PATIENT visit by telephone virtually in the context of Covid-19 pandemic. Patient location: My Location:  [facility] office Persons on the call:    History of Present Illness:    Observations/Objective:   Assessment and Plan:   Follow Up Instructions:    I discussed the assessment and treatment plan with the patient. The patient was provided an opportunity to ask questions and all were answered. The patient agreed with the plan and demonstrated an understanding of the instructions.   The patient was advised to call back or seek an in-person evaluation if the symptoms worsen or if the condition fails to improve as anticipated.  I provided *** minutes of non-face-to-face time during this encounter.   Brandon Co, PA-C  Patient ID: Brandon Arias, male   DOB: 02-Feb-1971, 49 y.o.   MRN: [id number]

## 2019-05-23 ENCOUNTER — Ambulatory Visit: Payer: Medicaid Other | Attending: Family Medicine | Admitting: Family Medicine

## 2019-05-23 ENCOUNTER — Encounter: Payer: Self-pay | Admitting: Family Medicine

## 2019-05-23 ENCOUNTER — Other Ambulatory Visit: Payer: Self-pay

## 2019-05-23 VITALS — BP 130/80 | HR 78 | Wt 153.0 lb

## 2019-05-23 DIAGNOSIS — M79642 Pain in left hand: Secondary | ICD-10-CM | POA: Insufficient documentation

## 2019-05-23 DIAGNOSIS — Z7952 Long term (current) use of systemic steroids: Secondary | ICD-10-CM | POA: Diagnosis not present

## 2019-05-23 DIAGNOSIS — M1A0291 Idiopathic chronic gout, unspecified elbow, with tophus (tophi): Secondary | ICD-10-CM

## 2019-05-23 DIAGNOSIS — M1A9XX1 Chronic gout, unspecified, with tophus (tophi): Secondary | ICD-10-CM | POA: Diagnosis not present

## 2019-05-23 DIAGNOSIS — Z791 Long term (current) use of non-steroidal anti-inflammatories (NSAID): Secondary | ICD-10-CM | POA: Diagnosis not present

## 2019-05-23 DIAGNOSIS — Z23 Encounter for immunization: Secondary | ICD-10-CM | POA: Insufficient documentation

## 2019-05-23 DIAGNOSIS — Z0000 Encounter for general adult medical examination without abnormal findings: Secondary | ICD-10-CM

## 2019-05-23 MED ORDER — ALLOPURINOL 300 MG PO TABS: 300.0000 mg | ORAL_TABLET | Freq: Every day | ORAL | 6 refills | Status: DC

## 2019-05-23 MED FILL — ALLOPURINOL 300 MG TAB: 300 | 30 days supply | Qty: 30 | Fill #0

## 2019-05-23 NOTE — Patient Instructions (Signed)

## 2019-05-23 NOTE — Progress Notes (Signed)
Follow up for hand pain.

## 2019-05-23 NOTE — Progress Notes (Signed)
Subjective:  Patient ID: Brandon Arias, male    DOB: 1970-08-24  Age: 49 y.o. MRN: [id number]  CC: Hand Pain   HPI Brandon Arias is a 49 year old male with a history of gout who was seen by Dr. Wynetta Emery 2 weeks ago for left hand pain which was treated with a course of prednisone, doxycycline and amoxicillin. Works at a [employer], manufacturing systems His left hand pain and swelling improved with the course of Prednisone and antibiotics but his left 5th finger is still swollen. Pain is described as 7/10.  Denies presence of fever.  His chart reveals a history of gout and he was last on allopurinol in 2019. He would need a note extending his stay out of work till 05/28/2018.  Past Medical History:  Diagnosis Date  . Gout 12/30/2017     Past Surgical History:  Procedure Laterality Date  . no known surgeries      No family history on file.  No Known Allergies  Outpatient Medications Prior to Visit  Medication Sig Dispense Refill  . allopurinol (ZYLOPRIM) 300 MG tablet Take 1 tablet (300 mg total) by mouth daily. 30 tablet 3  . diclofenac (VOLTAREN) 75 MG EC tablet Take 1 tablet (75 mg total) by mouth 2 (two) times daily. 50 tablet 2  . ibuprofen (ADVIL,MOTRIN) 200 MG tablet Take 200 mg by mouth every 6 (six) hours as needed.    . loratadine (CLARITIN) 10 MG tablet Take 1 tablet (10 mg total) by mouth daily. 30 tablet 11  . meloxicam (MOBIC) 7.5 MG tablet Take 1 tablet (7.5 mg total) by mouth daily. 30 tablet 1  . sodium chloride (BRONCHO SALINE) inhaler solution Take 1 spray by nebulization as needed. 90 mL 12  . amoxicillin (AMOXIL) 500 MG capsule Take 1 capsule (500 mg total) by mouth 3 (three) times daily. (Patient not taking: Reported on 05/23/2019) 21 capsule 0  . doxycycline (VIBRA-TABS) 100 MG tablet Take 1 tablet (100 mg total) by mouth 2 (two) times daily. (Patient not taking: Reported on 05/23/2019) 14 tablet 0  . predniSONE (DELTASONE) 20 MG tablet 2 tabs daily x 2 days then 1.5 tab PO  daily x 2 days then 1 tab x 2 days then 1/2 tab x 2 days. (Patient not taking: Reported on 05/23/2019) 10 tablet 0   No facility-administered medications prior to visit.     ROS Review of Systems  Constitutional: Negative for activity change and appetite change.  HENT: Negative for sinus pressure and sore throat.   Eyes: Negative for visual disturbance.  Respiratory: Negative for cough, chest tightness and shortness of breath.   Cardiovascular: Negative for chest pain and leg swelling.  Gastrointestinal: Negative for abdominal distention, abdominal pain, constipation and diarrhea.  Endocrine: Negative.   Genitourinary: Negative for dysuria.  Musculoskeletal:       See HPI  Skin: Negative for rash.  Allergic/Immunologic: Negative.   Neurological: Negative for weakness, light-headedness and numbness.  Psychiatric/Behavioral: Negative for dysphoric mood and suicidal ideas.    Objective:  BP 130/80   Pulse 78   Wt 153 lb (69.4 kg)   SpO2 97%   BP/Weight 05/23/2019 05/11/2019 7/41/6384  Systolic BP 536 468 032  Diastolic BP 80 91 83  Wt. (Lbs) 153 162 -      Physical Exam Constitutional:      Appearance: He is well-developed.  Neck:     Vascular: No JVD.  Cardiovascular:     Rate and Rhythm: Normal rate.  Heart sounds: Normal heart sounds. No murmur.  Pulmonary:     Effort: Pulmonary effort is normal.     Breath sounds: Normal breath sounds. No wheezing or rales.  Chest:     Chest wall: No tenderness.  Abdominal:     General: Bowel sounds are normal. There is no distension.     Palpations: Abdomen is soft. There is no mass.     Tenderness: There is no abdominal tenderness.  Musculoskeletal:        General: Normal range of motion.     Right lower leg: No edema.     Left lower leg: No edema.     Comments: Edema of left fifth finger with difficulty flexing Left hand is normal  Neurological:     Mental Status: He is alert and oriented to person, place, and time.    Psychiatric:        Mood and Affect: Mood normal.     CMP Latest Ref Rng & Units 11/22/2017  Glucose 65 - 99 mg/dL 80  BUN 6 - 24 mg/dL 15  Creatinine 0.76 - 1.27 mg/dL 1.58(H)  Sodium 134 - 144 mmol/L 142  Potassium 3.5 - 5.2 mmol/L 4.1  Chloride 96 - 106 mmol/L 106  CO2 20 - 29 mmol/L 21  Calcium 8.7 - 10.2 mg/dL 9.3  Total Protein 6.0 - 8.5 g/dL 7.5  Total Bilirubin 0.0 - 1.2 mg/dL 0.4  Alkaline Phos 39 - 117 IU/L 136(H)  AST 0 - 40 IU/L 180(H)  ALT 0 - 44 IU/L 122(H)    Lipid Panel  No results found for: CHOL, TRIG, HDL, CHOLHDL, VLDL, LDLCALC, LDLDIRECT  CBC    Component Value Date/Time   WBC 4.6 11/22/2017 1111   RBC 3.65 (L) 11/22/2017 1111   HGB 12.3 (L) 11/22/2017 1111   HCT 37.2 (L) 11/22/2017 1111   PLT 143 (L) 11/22/2017 1111   MCV 102 (H) 11/22/2017 1111   MCH 33.7 (H) 11/22/2017 1111   MCHC 33.1 11/22/2017 1111   RDW 14.8 11/22/2017 1111   LYMPHSABS 2.6 11/22/2017 1111   EOSABS 0.1 11/22/2017 1111   BASOSABS 0.1 11/22/2017 1111    No results found for: HGBA1C  Assessment & Plan:   1. 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  Dispense: 30 tablet; Refill: 6  2. Pain of left hand Improving Completed course of prednisone and antibiotics Could be explained by Gout Provided note for work to return on 05/29/2019 - allopurinol (ZYLOPRIM) 300 MG tablet; Take 1 tablet (300 mg total) by mouth daily.  Dispense: 30 tablet; Refill: 6  3. Healthcare maintenance - Flu Vaccine QUAD 36+ mos IM   Return in about 6 months (around 11/20/2019) for chronic medical conditions.   Charlott Rakes, MD, FAAFP. [facility] [place], [place]   05/23/2019, 10:24 AM

## 2019-05-24 ENCOUNTER — Other Ambulatory Visit: Payer: Self-pay | Admitting: Family Medicine

## 2019-05-24 ENCOUNTER — Encounter: Payer: Self-pay | Admitting: Family Medicine

## 2019-05-24 DIAGNOSIS — R7989 Other specified abnormal findings of blood chemistry: Secondary | ICD-10-CM

## 2019-05-24 LAB — CMP14+EGFR
ALT: 123 IU/L — ABNORMAL HIGH (ref 0–44)
AST: 196 IU/L — ABNORMAL HIGH (ref 0–40)
Albumin/Globulin Ratio: 0.8 — ABNORMAL LOW (ref 1.2–2.2)
Albumin: 3.8 g/dL — ABNORMAL LOW (ref 4.0–5.0)
Alkaline Phosphatase: 146 IU/L — ABNORMAL HIGH (ref 39–117)
BUN/Creatinine Ratio: 25 — ABNORMAL HIGH (ref 9–20)
BUN: 19 mg/dL (ref 6–24)
Bilirubin Total: 0.8 mg/dL (ref 0.0–1.2)
CO2: 22 mmol/L (ref 20–29)
Calcium: 9 mg/dL (ref 8.7–10.2)
Chloride: 101 mmol/L (ref 96–106)
Creatinine, Ser: 0.77 mg/dL (ref 0.76–1.27)
GFR calc Af Amer: 124 mL/min/{1.73_m2} (ref >59)
GFR calc non Af Amer: 107 mL/min/{1.73_m2} (ref >59)
Globulin, Total: 5 g/dL — ABNORMAL HIGH (ref 1.5–4.5)
Glucose: 71 mg/dL (ref 65–99)
Potassium: 4.6 mmol/L (ref 3.5–5.2)
Sodium: 140 mmol/L (ref 134–144)
Total Protein: 8.8 g/dL — ABNORMAL HIGH (ref 6.0–8.5)

## 2019-05-24 LAB — URIC ACID: Uric Acid: 11.1 mg/dL — ABNORMAL HIGH (ref 3.8–8.4)

## 2019-05-24 MED ORDER — PREDNISONE 20 MG PO TABS: ORAL_TABLET | ORAL | 0 refills | Status: DC

## 2019-05-24 MED FILL — predniSONE 20 MG TABS: 20 | 8 days supply | Qty: 10 | Fill #0

## 2019-05-25 ENCOUNTER — Telehealth: Payer: Self-pay

## 2019-05-25 NOTE — Telephone Encounter (Signed)
-----   Message from Enobong Newlin, MD sent at 05/24/2019 11:57 AM EST ----- His liver enzymes are also elevated.  Can you please have the lab add on hepatitis panel which I have ordered?  Thank you 

## 2019-05-25 NOTE — Telephone Encounter (Signed)
Patient was called and a voicemail was left informing patient to return phone call for lab results. 

## 2019-05-25 NOTE — Telephone Encounter (Signed)
-----   Message from Enobong Newlin, MD sent at 05/24/2019  8:23 AM EST ----- Please inform him his left hand swelling is as a result of gout.  Please advised to comply with allopurinol and I have sent a refill for prednisone to his pharmacy.  Foods like meat, red wine can lead to gout flare. 

## 2019-05-26 ENCOUNTER — Other Ambulatory Visit: Payer: Self-pay | Admitting: Family Medicine

## 2019-05-26 ENCOUNTER — Telehealth: Payer: Self-pay

## 2019-05-26 DIAGNOSIS — R7989 Other specified abnormal findings of blood chemistry: Secondary | ICD-10-CM

## 2019-05-26 LAB — HEPATITIS PANEL, ACUTE
Hep A IgM: NEGATIVE
Hep B C IgM: NEGATIVE
Hep C Virus Ab: 0.5 s/co ratio (ref 0.0–0.9)
Hepatitis B Surface Ag: NEGATIVE

## 2019-05-26 LAB — SPECIMEN STATUS REPORT

## 2019-05-26 NOTE — Telephone Encounter (Signed)
Patient was called and informed of lab results. 

## 2019-05-26 NOTE — Telephone Encounter (Signed)
-----   Message from Hoy Register, MD sent at 05/24/2019  8:23 AM EST ----- Please inform him his left hand swelling is as a result of gout.  Please advised to comply with allopurinol and I have sent a refill for prednisone to his pharmacy.  Foods like meat, red wine can lead to gout flare.

## 2019-05-26 NOTE — Telephone Encounter (Signed)
-----   Message from Hoy Register, MD sent at 05/24/2019 11:57 AM EST ----- His liver enzymes are also elevated.  Can you please have the lab add on hepatitis panel which I have ordered?  Thank you

## 2019-05-26 NOTE — Telephone Encounter (Signed)
-----   Message from Hoy Register, MD sent at 05/26/2019  2:47 PM EST ----- Please inform him his liver enzymes are elevated and I have sent ordered a RUQ ultrasound. Can you please assist in scheduling? Thanks

## 2019-05-30 ENCOUNTER — Ambulatory Visit: Payer: Medicaid Other | Admitting: Family Medicine

## 2019-06-01 NOTE — Telephone Encounter (Signed)
Patient was called and informed of US appointment 

## 2019-06-08 ENCOUNTER — Ambulatory Visit (HOSPITAL_COMMUNITY): Payer: Medicaid Other

## 2019-06-15 ENCOUNTER — Ambulatory Visit (HOSPITAL_COMMUNITY)
Admission: RE | Admit: 2019-06-15 | Discharge: 2019-06-15 | Disposition: A | Discharge status: Home / Self Care | Payer: Medicaid Other | Source: Ambulatory Visit | Attending: Family Medicine | Admitting: Family Medicine

## 2019-06-15 ENCOUNTER — Telehealth: Payer: Self-pay

## 2019-06-15 ENCOUNTER — Ambulatory Visit (HOSPITAL_COMMUNITY): Admission: RE | Admit: 2019-06-15 | Payer: Medicaid Other | Source: Ambulatory Visit

## 2019-06-15 ENCOUNTER — Other Ambulatory Visit: Payer: Self-pay

## 2019-06-15 DIAGNOSIS — R7989 Other specified abnormal findings of blood chemistry: Secondary | ICD-10-CM | POA: Insufficient documentation

## 2019-06-15 NOTE — Telephone Encounter (Signed)
-----   Message from Hoy Register, MD sent at 06/15/2019  2:40 PM EST ----- His liver ultrasound is normal but liver enzymes are elevated. Please advise to discontinue alcohol consumption if he does drink as that could be a cause

## 2019-06-15 NOTE — Telephone Encounter (Signed)
Patient was called and a voicemail was left informing patient to return phone call for lab results. 

## 2019-06-23 ENCOUNTER — Emergency Department (HOSPITAL_COMMUNITY): Payer: Medicaid Other

## 2019-06-23 ENCOUNTER — Other Ambulatory Visit: Payer: Self-pay

## 2019-06-23 ENCOUNTER — Emergency Department (HOSPITAL_COMMUNITY)
Admission: EM | Admit: 2019-06-23 | Discharge: 2019-06-24 | Disposition: A | Discharge status: Home / Self Care | Payer: Medicaid Other | Attending: Emergency Medicine | Admitting: Emergency Medicine

## 2019-06-23 ENCOUNTER — Encounter (HOSPITAL_COMMUNITY): Payer: Self-pay | Admitting: Emergency Medicine

## 2019-06-23 DIAGNOSIS — Y9389 Activity, other specified: Secondary | ICD-10-CM | POA: Diagnosis not present

## 2019-06-23 DIAGNOSIS — Y929 Unspecified place or not applicable: Secondary | ICD-10-CM | POA: Diagnosis not present

## 2019-06-23 DIAGNOSIS — Y908 Blood alcohol level of 240 mg/100 ml or more: Secondary | ICD-10-CM | POA: Diagnosis not present

## 2019-06-23 DIAGNOSIS — R4781 Slurred speech: Secondary | ICD-10-CM | POA: Diagnosis not present

## 2019-06-23 DIAGNOSIS — R451 Restlessness and agitation: Secondary | ICD-10-CM | POA: Insufficient documentation

## 2019-06-23 DIAGNOSIS — Y998 Other external cause status: Secondary | ICD-10-CM | POA: Diagnosis not present

## 2019-06-23 DIAGNOSIS — F10920 Alcohol use, unspecified with intoxication, uncomplicated: Secondary | ICD-10-CM | POA: Diagnosis not present

## 2019-06-23 DIAGNOSIS — R4182 Altered mental status, unspecified: Secondary | ICD-10-CM | POA: Diagnosis present

## 2019-06-23 LAB — CBC WITH DIFFERENTIAL/PLATELET
Abs Immature Granulocytes: 0.03 10*3/uL (ref 0.00–0.07)
Basophils Absolute: 0.1 10*3/uL (ref 0.0–0.1)
Basophils Relative: 1 %
Eosinophils Absolute: 0 10*3/uL (ref 0.0–0.5)
Eosinophils Relative: 0 %
HCT: 37.1 % — ABNORMAL LOW (ref 39.0–52.0)
Hemoglobin: 12 g/dL — ABNORMAL LOW (ref 13.0–17.0)
Immature Granulocytes: 0 %
Lymphocytes Relative: 69 %
Lymphs Abs: 5.9 10*3/uL — ABNORMAL HIGH (ref 0.7–4.0)
MCH: 28.6 pg (ref 26.0–34.0)
MCHC: 32.3 g/dL (ref 30.0–36.0)
MCV: 88.3 fL (ref 80.0–100.0)
Monocytes Absolute: 0.5 10*3/uL (ref 0.1–1.0)
Monocytes Relative: 5 %
Neutro Abs: 2.2 10*3/uL (ref 1.7–7.7)
Neutrophils Relative %: 25 %
Platelets: 144 10*3/uL — ABNORMAL LOW (ref 150–400)
RBC: 4.2 MIL/uL — ABNORMAL LOW (ref 4.22–5.81)
RDW: 21.8 % — ABNORMAL HIGH (ref 11.5–15.5)
WBC: 8.7 10*3/uL (ref 4.0–10.5)
nRBC: 0 % (ref 0.0–0.2)

## 2019-06-23 LAB — BASIC METABOLIC PANEL
Anion gap: 16 — ABNORMAL HIGH (ref 5–15)
BUN: 17 mg/dL (ref 6–20)
CO2: 21 mmol/L — ABNORMAL LOW (ref 22–32)
Calcium: 8.9 mg/dL (ref 8.9–10.3)
Chloride: 99 mmol/L (ref 98–111)
Creatinine, Ser: 0.86 mg/dL (ref 0.61–1.24)
GFR calc Af Amer: >60 mL/min (ref >60)
GFR calc non Af Amer: >60 mL/min (ref >60)
Glucose, Bld: 101 mg/dL — ABNORMAL HIGH (ref 70–99)
Potassium: 4 mmol/L (ref 3.5–5.1)
Sodium: 136 mmol/L (ref 135–145)

## 2019-06-23 LAB — ETHANOL: Alcohol, Ethyl (B): 460 mg/dL (ref <10)

## 2019-06-23 MED ORDER — DROPERIDOL 2.5 MG/ML IJ SOLN
5.0000 mg | Freq: Once | INTRAMUSCULAR | Status: AC
  Administered 2019-06-23: 21:00:00 5 mg via INTRAMUSCULAR
  Filled 2019-06-23: qty 2
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
## 2019-06-23 NOTE — ED Notes (Signed)
Date and time results received: 06/23/19 2109   Test: Ethanol Critical Value: 460  Name of Provider Notified: Jeraldine Loots  Orders Received? Or Actions Taken?: Continue to monitor

## 2019-06-23 NOTE — ED Notes (Signed)
Pt refusing BP, pulse oximeter, and cardiac leads. When applied, pt removed monitor equipment.
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
## 2019-06-24 NOTE — ED Notes (Signed)
Dc instructions given to the pt, attempted to call contact number on the chart "sister ' with no respond. Pt wheeled out to the lobby on wheel chair and phone provider to call family member for transportation, pt more clear and able to walk on the lobby.

## 2019-06-24 NOTE — Discharge Instructions (Addendum)
Do not drink alcohol and drive.  Take ibuprofen or naproxen as needed for pain.

## 2019-08-25 ENCOUNTER — Encounter: Payer: Self-pay | Admitting: Family Medicine

## 2019-08-25 ENCOUNTER — Other Ambulatory Visit: Payer: Self-pay

## 2019-08-25 ENCOUNTER — Ambulatory Visit: Payer: Medicaid Other | Attending: Family Medicine | Admitting: Family Medicine

## 2019-08-25 VITALS — BP 150/90 | HR 73 | Temp 98.1°F | Ht 65.0 in | Wt 162.0 lb

## 2019-08-25 DIAGNOSIS — Y999 Unspecified external cause status: Secondary | ICD-10-CM | POA: Diagnosis not present

## 2019-08-25 DIAGNOSIS — Y939 Activity, unspecified: Secondary | ICD-10-CM | POA: Diagnosis not present

## 2019-08-25 DIAGNOSIS — M79641 Pain in right hand: Secondary | ICD-10-CM

## 2019-08-25 DIAGNOSIS — M1A0291 Idiopathic chronic gout, unspecified elbow, with tophus (tophi): Secondary | ICD-10-CM

## 2019-08-25 DIAGNOSIS — Z758 Other problems related to medical facilities and other health care: Secondary | ICD-10-CM

## 2019-08-25 DIAGNOSIS — S6990XA Unspecified injury of unspecified wrist, hand and finger(s), initial encounter: Secondary | ICD-10-CM | POA: Diagnosis not present

## 2019-08-25 DIAGNOSIS — G8929 Other chronic pain: Secondary | ICD-10-CM

## 2019-08-25 DIAGNOSIS — Z79899 Other long term (current) drug therapy: Secondary | ICD-10-CM | POA: Diagnosis not present

## 2019-08-25 DIAGNOSIS — Z789 Other specified health status: Secondary | ICD-10-CM

## 2019-08-25 DIAGNOSIS — Z603 Acculturation difficulty: Secondary | ICD-10-CM

## 2019-08-25 DIAGNOSIS — S6981XS Other specified injuries of right wrist, hand and finger(s), sequela: Secondary | ICD-10-CM | POA: Diagnosis not present

## 2019-08-25 DIAGNOSIS — Y929 Unspecified place or not applicable: Secondary | ICD-10-CM | POA: Insufficient documentation

## 2019-08-25 MED ORDER — TRAMADOL HCL 50 MG PO TABS: 50.0000 mg | ORAL_TABLET | Freq: Four times a day (QID) | ORAL | 0 refills | Status: AC | PRN

## 2019-08-25 MED ORDER — METHYLPREDNISOLONE SODIUM SUCC 125 MG IJ SOLR
125.0000 mg | Freq: Once | INTRAMUSCULAR | Status: AC
  Administered 2019-08-25: 15:00:00 125 mg via INTRAMUSCULAR

## 2019-08-25 MED ORDER — PREDNISONE 20 MG PO TABS: ORAL_TABLET | ORAL | 0 refills | Status: DC

## 2019-08-25 MED FILL — predniSONE 20 MG TABS: 20 | 9 days supply | Qty: 11 | Fill #0

## 2019-08-25 MED FILL — traMADol HCL 50 MG TABS: 50 | 3 days supply | Qty: 15 | Fill #0

## 2019-08-25 NOTE — Progress Notes (Signed)
Fingers swollen
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
## 2019-08-26 ENCOUNTER — Encounter: Payer: Self-pay | Admitting: Family Medicine

## 2019-08-26 LAB — BASIC METABOLIC PANEL WITH GFR
BUN/Creatinine Ratio: 13 (ref 9–20)
BUN: 11 mg/dL (ref 6–24)
CO2: 20 mmol/L (ref 20–29)
Calcium: 9.1 mg/dL (ref 8.7–10.2)
Chloride: 102 mmol/L (ref 96–106)
Creatinine, Ser: 0.84 mg/dL (ref 0.76–1.27)
GFR calc Af Amer: 119 mL/min/1.73
GFR calc non Af Amer: 103 mL/min/1.73
Glucose: 71 mg/dL (ref 65–99)
Potassium: 4.4 mmol/L (ref 3.5–5.2)
Sodium: 141 mmol/L (ref 134–144)

## 2019-08-26 LAB — URIC ACID: Uric Acid: 10.1 mg/dL — ABNORMAL HIGH (ref 3.8–8.4)

## 2019-09-01 ENCOUNTER — Telehealth: Payer: Self-pay | Admitting: Licensed Clinical Social Worker

## 2019-09-01 NOTE — Telephone Encounter (Signed)
Call placed to patient utilizing [employer] Loraine Leriche [phone]) regarding IBH referral. A message was left requesting a return call.

## 2019-09-04 ENCOUNTER — Ambulatory Visit: Payer: Medicaid Other | Admitting: Family Medicine

## 2019-09-05 ENCOUNTER — Ambulatory Visit: Payer: Medicaid Other | Admitting: Orthopaedic Surgery

## 2019-09-07 ENCOUNTER — Ambulatory Visit: Payer: Medicaid Other | Admitting: Orthopaedic Surgery

## 2019-09-20 ENCOUNTER — Ambulatory Visit: Payer: Medicaid Other | Admitting: Family Medicine

## 2019-09-28 ENCOUNTER — Ambulatory Visit: Payer: Self-pay | Attending: Family Medicine | Admitting: Physician Assistant

## 2019-09-28 ENCOUNTER — Other Ambulatory Visit: Payer: Self-pay

## 2019-09-28 DIAGNOSIS — Z789 Other specified health status: Secondary | ICD-10-CM

## 2019-09-28 DIAGNOSIS — M79642 Pain in left hand: Secondary | ICD-10-CM

## 2019-09-28 DIAGNOSIS — M1A0291 Idiopathic chronic gout, unspecified elbow, with tophus (tophi): Secondary | ICD-10-CM

## 2019-09-28 DIAGNOSIS — M79641 Pain in right hand: Secondary | ICD-10-CM

## 2019-09-28 DIAGNOSIS — G8929 Other chronic pain: Secondary | ICD-10-CM

## 2019-09-28 DIAGNOSIS — R7989 Other specified abnormal findings of blood chemistry: Secondary | ICD-10-CM

## 2019-09-28 DIAGNOSIS — R030 Elevated blood-pressure reading, without diagnosis of hypertension: Secondary | ICD-10-CM

## 2019-09-28 MED ORDER — ALLOPURINOL 300 MG PO TABS: 300.0000 mg | ORAL_TABLET | Freq: Every day | ORAL | 6 refills | Status: DC

## 2019-09-28 MED FILL — ALLOPURINOL 300 MG TAB: 300 | 30 days supply | Qty: 30 | Fill #0
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
## 2019-09-29 LAB — COMPREHENSIVE METABOLIC PANEL
ALT: 51 IU/L — ABNORMAL HIGH (ref 0–44)
AST: 96 IU/L — ABNORMAL HIGH (ref 0–40)
Albumin/Globulin Ratio: 1.1 — ABNORMAL LOW (ref 1.2–2.2)
Albumin: 4 g/dL (ref 4.0–5.0)
Alkaline Phosphatase: 125 IU/L — ABNORMAL HIGH (ref 48–121)
BUN/Creatinine Ratio: 17 (ref 9–20)
BUN: 13 mg/dL (ref 6–24)
Bilirubin Total: 0.7 mg/dL (ref 0.0–1.2)
CO2: 21 mmol/L (ref 20–29)
Calcium: 9 mg/dL (ref 8.7–10.2)
Chloride: 101 mmol/L (ref 96–106)
Creatinine, Ser: 0.77 mg/dL (ref 0.76–1.27)
GFR calc Af Amer: 123 mL/min/{1.73_m2} (ref >59)
GFR calc non Af Amer: 107 mL/min/{1.73_m2} (ref >59)
Globulin, Total: 3.7 g/dL (ref 1.5–4.5)
Glucose: 112 mg/dL — ABNORMAL HIGH (ref 65–99)
Potassium: 4.2 mmol/L (ref 3.5–5.2)
Sodium: 137 mmol/L (ref 134–144)
Total Protein: 7.7 g/dL (ref 6.0–8.5)

## 2019-10-02 ENCOUNTER — Other Ambulatory Visit: Payer: Self-pay

## 2019-10-02 ENCOUNTER — Ambulatory Visit: Payer: Self-pay | Attending: Family Medicine | Admitting: Licensed Clinical Social Worker

## 2019-10-02 DIAGNOSIS — F4323 Adjustment disorder with mixed anxiety and depressed mood: Secondary | ICD-10-CM

## 2019-10-06 NOTE — BH Specialist Note (Signed)
Integrated Behavioral Health Initial Visit  MRN: [id number] Name: Brandon Arias  Number of Integrated Behavioral Health Clinician visits:: 1/6 Session Start time: 11:30 AM  Session End time: 12:15 PM Total time: 45   Type of Service: Integrated Behavioral Health- Individual Interpretor:Yes.   Interpretor Name and Language: Ardyth Gal [id number]   SUBJECTIVE: Brandon Arias is a 49 y.o. male accompanied by self Patient was referred by Trena Platt  for resources. Patient reports the following symptoms/concerns: Patient reports increase in stress triggered by financial strain.  Patient states that he has been sick therefore unable to work. Duration of problem: Ongoing; Severity of problem: mild  OBJECTIVE: Mood: Anxious and Affect: Appropriate Risk of harm to self or others: No plan to harm self or others  LIFE CONTEXT: Family and Social: Resides with spouse and children School/Work: Patient spouse is employed Self-Care: Patient speaks with spouse regarding stressors Life Changes: And is experiencing financial strain  GOALS ADDRESSED: Patient will: 1. Reduce symptoms of: stress 2. Increase knowledge and/or ability of: stress reduction  3. Demonstrate ability to: Increase healthy adjustment to current life circumstances and Increase adequate support systems for patient/family  INTERVENTIONS: Interventions utilized: Solution-Focused Strategies, Supportive Counseling and Link to [facility]  Standardized Assessments completed: Not Needed  ASSESSMENT: Patient currently experiencing crease and stress triggered by financial strain.   Patient may benefit from linkage to community resources.  LCSW provided support and encouragement.  LCSW discussed benefits of applying for financial assistance through [facility] health and provided supportive resources to assist with utilities and or rent.  PLAN: 1. Follow up with behavioral health clinician on : June 21, 21 2. Behavioral  recommendations: Utilize resources provided 3. Referral(s): Integrated Art gallery manager (In Clinic) and [employer]:  Finances 4. "From scale of 1-10, how likely are you to follow plan?":   Bridgett Larsson, LCSW 10/06/2019 10:28 AM

## 2019-10-16 ENCOUNTER — Other Ambulatory Visit: Payer: Self-pay

## 2019-10-16 ENCOUNTER — Ambulatory Visit: Payer: Self-pay | Attending: Family Medicine | Admitting: Licensed Clinical Social Worker

## 2019-10-16 DIAGNOSIS — Z599 Problem related to housing and economic circumstances, unspecified: Secondary | ICD-10-CM

## 2019-10-16 DIAGNOSIS — Z598 Other problems related to housing and economic circumstances: Secondary | ICD-10-CM

## 2019-10-20 ENCOUNTER — Ambulatory Visit: Payer: Self-pay | Attending: Family Medicine

## 2019-10-20 ENCOUNTER — Other Ambulatory Visit: Payer: Self-pay

## 2019-10-23 NOTE — BH Specialist Note (Signed)
Integrated Behavioral Health Follow Up Visit  MRN: [id number] Name: Brandon Plack  LCSW met with patient to review Financial Counseling Application and ensure pt understood which supportive paperwork is needed to attach with application.   Pt scheduled appointment with Financial Counselor for Friday, June 25, 21.   No additional concerns noted.  Rebekah Chesterfield, LCSW 10/23/2019 3:48 PM

## 2019-11-02 ENCOUNTER — Ambulatory Visit: Payer: Medicaid Other | Admitting: Family Medicine

## 2019-12-06 ENCOUNTER — Other Ambulatory Visit: Payer: Self-pay

## 2019-12-06 ENCOUNTER — Ambulatory Visit: Payer: No Typology Code available for payment source | Attending: Family Medicine | Admitting: Family Medicine

## 2019-12-06 ENCOUNTER — Encounter: Payer: Self-pay | Admitting: Family Medicine

## 2019-12-06 VITALS — BP 142/78 | HR 88 | Ht 65.0 in | Wt 155.2 lb

## 2019-12-06 DIAGNOSIS — G8929 Other chronic pain: Secondary | ICD-10-CM

## 2019-12-06 DIAGNOSIS — M1A0291 Idiopathic chronic gout, unspecified elbow, with tophus (tophi): Secondary | ICD-10-CM | POA: Diagnosis not present

## 2019-12-06 DIAGNOSIS — M79641 Pain in right hand: Secondary | ICD-10-CM | POA: Diagnosis not present

## 2019-12-06 DIAGNOSIS — M79642 Pain in left hand: Secondary | ICD-10-CM

## 2019-12-06 MED ORDER — ALLOPURINOL 300 MG PO TABS: 300.0000 mg | ORAL_TABLET | Freq: Every day | ORAL | 6 refills | Status: DC

## 2019-12-06 MED ORDER — NAPROXEN 500 MG PO TABS: 500.0000 mg | ORAL_TABLET | Freq: Two times a day (BID) | ORAL | 2 refills | Status: DC

## 2019-12-06 NOTE — Progress Notes (Signed)
Patient is needing letter for financial  assistance with his rent.

## 2019-12-06 NOTE — Progress Notes (Signed)
Subjective:  Patient ID: Brandon Arias, male    DOB: [birth year]  Age: 49 y.o. MRN: [id number]  CC: Gout   HPI Tabb Croghan is a 49 year old male with a history of Gout He states he hasn't worked since 07/2019 for 'health reasons" and he is needing a letter to obtain resources including housing and the international community that is trying to assist him asked him to obtain a letter from his doctor. 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 He has not been taking his allopurinol which is on his med list.  Past Medical History:  Diagnosis Date  . Gout 12/30/2017    Past Surgical History:  Procedure Laterality Date  . no known surgeries      History reviewed. No pertinent family history.  No Known Allergies  Outpatient Medications Prior to Visit  Medication Sig Dispense Refill  . loratadine (CLARITIN) 10 MG tablet Take 1 tablet (10 mg total) by mouth daily. (Patient not taking: Reported on 08/25/2019) 30 tablet 11  . predniSONE (DELTASONE) 20 MG tablet 3 pills the first day then 2 pills daily for 2 days, 1 pill x 2 days then 1/2 pill x 4 days. Take after eating (Patient not taking: Reported on 12/06/2019) 11 tablet 0  . sodium chloride (BRONCHO SALINE) inhaler solution Take 1 spray by nebulization as needed. (Patient not taking: Reported on 08/25/2019) 90 mL 12  . allopurinol (ZYLOPRIM) 300 MG tablet Take 1 tablet (300 mg total) by mouth daily. (Patient not taking: Reported on 12/06/2019) 30 tablet 6   No facility-administered medications prior to visit.     ROS Review of Systems  Constitutional: Negative for activity change and appetite change.  HENT: Negative for sinus pressure and sore throat.   Eyes: Negative for visual disturbance.  Respiratory: Negative for cough, chest tightness and shortness of breath.   Cardiovascular: Negative for chest pain and leg  swelling.  Gastrointestinal: Negative for abdominal distention, abdominal pain, constipation and diarrhea.  Endocrine: Negative.   Genitourinary: Negative for dysuria.  Musculoskeletal:       See HPI  Skin: Negative for rash.  Allergic/Immunologic: Negative.   Neurological: Negative for weakness, light-headedness and numbness.  Psychiatric/Behavioral: Negative for dysphoric mood and suicidal ideas.    Objective:  BP (!) 142/78   Pulse 88   Ht 5\' 5"  (1.651 m)   Wt 155 lb 3.2 oz (70.4 kg)   SpO2 98%   BMI 25.83 kg/m   BP/Weight 12/06/2019 08/25/2019 06/24/2019  Systolic BP 142 150 140  Diastolic BP 78 90 85  Wt. (Lbs) 155.2 162 -  BMI 25.83 26.96 -      Physical Exam Constitutional:      Appearance: He is well-developed.  Neck:     Vascular: No JVD.  Cardiovascular:     Rate and Rhythm: Normal rate.     Heart sounds: Normal heart sounds. No murmur heard.   Pulmonary:     Effort: Pulmonary effort is normal.     Breath sounds: Normal breath sounds. No wheezing or rales.  Chest:     Chest wall: No tenderness.  Abdominal:     General: Bowel sounds are normal. There is no distension.     Palpations: Abdomen is soft. There is no mass.     Tenderness: There is no abdominal tenderness.  Musculoskeletal:  General: Normal range of motion.     Right lower leg: No edema.     Left lower leg: No edema.     Comments: Tophi on bilateral elbows No tenderness elicited on palpation of tophi and on range of motion of elbows or other joints  Neurological:     Mental Status: He is alert and oriented to person, place, and time.  Psychiatric:        Mood and Affect: Mood normal.     CMP Latest Ref Rng & Units 09/28/2019 08/25/2019 06/23/2019  Glucose 65 - 99 mg/dL 211(H) 71 417(E)  BUN 6 - 24 mg/dL 13 11 17   Creatinine 0.76 - 1.27 mg/dL 0.81 4.48  Sodium 134 - 144 mmol/L 137 141 136  Potassium 3.5 - 5.2 mmol/L 4.2 4.4 4.0  Chloride 96 - 106 mmol/L 101 102 99  CO2 20 - 29  mmol/L 21 20 21(L)  Calcium 8.7 - 10.2 mg/dL 9.0 9.1 8.9  Total Protein 6.0 - 8.5 g/dL 7.7 - -  Total Bilirubin 0.0 - 1.2 mg/dL 0.7 - -  Alkaline Phos 48 - 121 IU/L 125(H) - -  AST 0 - 40 IU/L 96(H) - -  ALT 0 - 44 IU/L 51(H) - -    Lipid Panel  No results found for: CHOL, TRIG, HDL, CHOLHDL, VLDL, LDLCALC, LDLDIRECT  CBC    Component Value Date/Time   WBC 8.7 06/23/2019 2026   RBC 4.20 (L) 06/23/2019 2026   HGB 12.0 (L) 06/23/2019 2026   HGB 12.3 (L) 11/22/2017 1111   HCT 37.1 (L) 06/23/2019 2026   HCT 37.2 (L) 11/22/2017 1111   PLT 144 (L) 06/23/2019 2026   PLT 143 (L) 11/22/2017 1111   MCV 88.3 06/23/2019 2026   MCV 102 (H) 11/22/2017 1111   MCH 28.6 06/23/2019 2026   MCHC 32.3 06/23/2019 2026   RDW 21.8 (H) 06/23/2019 2026   RDW 14.8 11/22/2017 1111   LYMPHSABS 5.9 (H) 06/23/2019 2026   LYMPHSABS 2.6 11/22/2017 1111   MONOABS 0.5 06/23/2019 2026   EOSABS 0.0 06/23/2019 2026   EOSABS 0.1 11/22/2017 1111   BASOSABS 0.1 06/23/2019 2026   BASOSABS 0.1 11/22/2017 1111    No results found for: HGBA1C  Assessment & Plan:   1. Idiopathic chronic gout of elbow with tophus, unspecified laterality I have explained to him that his tophi do not preclude him from maintaining a job.  It is my medical opinion that he can take his allopurinol consistently along with an anti-inflammatory for management of his gout. I am happy to provide him with a letter to work including job restrictions -he agrees to this and have provided him with a letter. Advised to pick up allopurinol from the pharmacy. - naproxen (NAPROSYN) 500 MG tablet; Take 1 tablet (500 mg total) by mouth 2 (two) times daily with a meal.  Dispense: 60 tablet; Refill: 2    Meds ordered this encounter  Medications  . naproxen (NAPROSYN) 500 MG tablet    Sig: Take 1 tablet (500 mg total) by mouth 2 (two) times daily with a meal.    Dispense:  60 tablet    Refill:  2  . allopurinol (ZYLOPRIM) 300 MG tablet    Sig:  Take 1 tablet (300 mg total) by mouth daily.    Dispense:  30 tablet    Refill:  6    Follow-up: Return in about 6 months (around 06/07/2020) for medical conditions.  Hoy Register, MD, FAAFP. [facility] [place], [place] [phone]   12/06/2019, 5:08 PM

## 2019-12-07 MED FILL — NAPROXEN 500 MG TABLET: 500 | 30 days supply | Qty: 60 | Fill #0

## 2019-12-07 MED FILL — ALLOPURINOL 300 MG TAB: 300 | 30 days supply | Qty: 30 | Fill #0

## 2020-09-23 ENCOUNTER — Emergency Department (HOSPITAL_COMMUNITY)
Admission: EM | Admit: 2020-09-23 | Discharge: 2020-09-23 | Disposition: A | Discharge status: Home / Self Care | Payer: Medicaid Other | Attending: Emergency Medicine | Admitting: Emergency Medicine

## 2020-09-23 ENCOUNTER — Other Ambulatory Visit: Payer: Self-pay

## 2020-09-23 ENCOUNTER — Encounter (HOSPITAL_COMMUNITY): Payer: Self-pay | Admitting: *Deleted

## 2020-09-23 DIAGNOSIS — S0101XA Laceration without foreign body of scalp, initial encounter: Secondary | ICD-10-CM | POA: Insufficient documentation

## 2020-09-23 DIAGNOSIS — W010XXA Fall on same level from slipping, tripping and stumbling without subsequent striking against object, initial encounter: Secondary | ICD-10-CM | POA: Insufficient documentation

## 2020-09-23 DIAGNOSIS — S0990XA Unspecified injury of head, initial encounter: Secondary | ICD-10-CM | POA: Diagnosis present

## 2020-09-23 DIAGNOSIS — Z23 Encounter for immunization: Secondary | ICD-10-CM | POA: Diagnosis not present

## 2020-09-23 MED ORDER — TETANUS-DIPHTH-ACELL PERTUSSIS 5-2.5-18.5 LF-MCG/0.5 IM SUSY
0.5000 mL | PREFILLED_SYRINGE | Freq: Once | INTRAMUSCULAR | Status: AC
  Administered 2020-09-23: 0.5 mL via INTRAMUSCULAR
  Filled 2020-09-23: qty 0.5

## 2020-09-23 MED ORDER — HYDROCODONE-ACETAMINOPHEN 5-325 MG PO TABS
1.0000 | ORAL_TABLET | Freq: Once | ORAL | Status: AC
  Administered 2020-09-23: 1 via ORAL
  Filled 2020-09-23: qty 1

## 2020-09-23 NOTE — ED Provider Notes (Signed)
[facility] EMERGENCY DEPARTMENT Provider Note   CSN: [id number] Arrival date & time: 09/23/20  0022     History Chief Complaint  Patient presents with  . Fall    Sten Dematteo is a 50 y.o. male.  50 year old male presents to the emergency department for evaluation of head injury.  He was walking this evening when he tripped and fell backwards striking his head on the ground.  This occurred at 2300.  He had no loss of consciousness.  Denies any headache or pain at this time.  No vision changes, nausea, vomiting.  No medications taken prior to arrival.  Unsure of the date of his last tetanus shot.  The history is provided by the patient. No language interpreter was used.  Fall This is a new problem. Episode onset: 5 hours ago. The problem has not changed since onset.      Past Medical History:  Diagnosis Date  . Gout 12/30/2017    Patient Active Problem List   Diagnosis Date Noted  . Gout 12/30/2017    Past Surgical History:  Procedure Laterality Date  . no known surgeries         No family history on file.  Social History   Tobacco Use  . Smoking status: Never Smoker  . Smokeless tobacco: Never Used  Substance Use Topics  . Alcohol use: Yes    Comment: occ  . Drug use: Never    Home Medications Prior to Admission medications   Medication Sig Start Date End Date Taking? Authorizing Provider  allopurinol (ZYLOPRIM) 300 MG tablet Take 1 tablet (300 mg total) by mouth daily. Patient not taking: Reported on 09/23/2020 12/06/19   Hoy Register, MD  loratadine (CLARITIN) 10 MG tablet Take 1 tablet (10 mg total) by mouth daily. Patient not taking: No sig reported 08/08/18   Grayce Sessions, NP  naproxen (NAPROSYN) 500 MG tablet Take 1 tablet (500 mg total) by mouth 2 (two) times daily with a meal. Patient not taking: Reported on 09/23/2020 12/06/19   Hoy Register, MD  predniSONE (DELTASONE) 20 MG tablet 3 pills the first day then 2 pills  daily for 2 days, 1 pill x 2 days then 1/2 pill x 4 days. Take after eating Patient not taking: No sig reported 08/25/19   Fulp, Cammie, MD  sodium chloride (BRONCHO SALINE) inhaler solution Take 1 spray by nebulization as needed. Patient not taking: No sig reported 08/08/18   Grayce Sessions, NP    Allergies    Patient has no known allergies.  Review of Systems   Review of Systems  Ten systems reviewed and are negative for acute change, except as noted in the HPI.    Physical Exam Updated Vital Signs BP 131/90 (BP Location: Right Arm)   Pulse 69   Temp (!) 97.3 F (36.3 C) (Oral)   Resp 15   Ht 5\' 5"  (1.651 m)   Wt 70.4 kg   SpO2 100%   BMI 25.83 kg/m   Physical Exam Vitals and nursing note reviewed.  Constitutional:      General: He is not in acute distress.    Appearance: He is well-developed. He is not diaphoretic.     Comments: Nontoxic appearing and in NAD  HENT:     Head: Normocephalic.     Comments: Left posterior parietal hematoma with associated 3cm laceration. No active bleeding.    Right Ear: External ear normal.     Left Ear: External ear  normal.  Eyes:     General: No scleral icterus.    Extraocular Movements: Extraocular movements intact.     Conjunctiva/sclera: Conjunctivae normal.     Pupils: Pupils are equal, round, and reactive to light.  Pulmonary:     Effort: Pulmonary effort is normal. No respiratory distress.     Comments: Respirations even and unlabored Musculoskeletal:        General: Normal range of motion.     Cervical back: Normal range of motion.  Skin:    General: Skin is warm and dry.     Coloration: Skin is not pale.     Findings: No erythema or rash.  Neurological:     Mental Status: He is alert and oriented to person, place, and time.     Coordination: Coordination normal.     Comments: GCS 15. Answers questions appropriately and follows commands.  Psychiatric:        Behavior: Behavior normal.     ED Results /  Procedures / Treatments   Labs (all labs ordered are listed, but only abnormal results are displayed) Labs Reviewed - No data to display  EKG None  Radiology No results found.  Procedures Procedures   LACERATION REPAIR Performed by: Antony Madura Authorized by: Antony Madura Consent: Verbal consent obtained. Risks and benefits: risks, benefits and alternatives were discussed Consent given by: patient Patient identity confirmed: provided demographic data Prepped and Draped in normal sterile fashion Wound explored  Laceration Location: scalp  Laceration Length: 3cm  No Foreign Bodies seen or palpated  Anesthesia: none  Irrigation method: syringe Amount of cleaning: standard  Skin closure: staples  Number of staples: 3  Technique: simple  Patient tolerance: Patient tolerated the procedure well with no immediate complications.   Medications Ordered in ED Medications  HYDROcodone-acetaminophen (NORCO/VICODIN) 5-325 MG per tablet 1 tablet (1 tablet Oral Given 09/23/20 0531)  Tdap (BOOSTRIX) injection 0.5 mL (0.5 mLs Intramuscular Given 09/23/20 0535)    ED Course  I have reviewed the triage vital signs and the nursing notes.  Pertinent labs & imaging results that were available during my care of the patient were reviewed by me and considered in my medical decision making (see chart for details).    MDM Rules/Calculators/A&P                          Tdap booster given. Laceration occurred < 8 hours prior to repair which was well tolerated. Patient has no comorbidities to effect normal wound healing. Discussed suture home care with pt and answered questions. Patient to follow up for wound check and staple removal in 7 days. Patient is hemodynamically stable with no complaints prior to discharge.     Final Clinical Impression(s) / ED Diagnoses Final diagnoses:  Laceration of scalp, initial encounter    Rx / DC Orders ED Discharge Orders    None       Antony Madura, PA-C 09/23/20 0551    Nira Conn, MD 09/23/20 [phone]

## 2020-09-23 NOTE — ED Triage Notes (Addendum)
The pt stumbled and fell around 2300 tonight  No loc  He struck the back of his head   One inch laceration to the back of his head sl swelling and  Small amount of active bleeding

## 2020-09-23 NOTE — Discharge Instructions (Signed)
Take tylenol or ibuprofen for pain. Have your staples removed in 1 week.

## 2020-09-23 NOTE — ED Notes (Signed)
Suture cart placed at bedside. 

## 2020-10-04 ENCOUNTER — Ambulatory Visit (HOSPITAL_COMMUNITY)
Admission: EM | Admit: 2020-10-04 | Discharge: 2020-10-04 | Disposition: A | Discharge status: Home / Self Care | Payer: Medicaid Other | Attending: Internal Medicine | Admitting: Internal Medicine

## 2020-10-04 ENCOUNTER — Other Ambulatory Visit: Payer: Self-pay

## 2020-10-04 DIAGNOSIS — Z4802 Encounter for removal of sutures: Secondary | ICD-10-CM | POA: Diagnosis not present

## 2020-10-04 NOTE — ED Triage Notes (Signed)
Pt presents for staple removal. Staples were placed on on 09/23/20.

## 2020-12-04 ENCOUNTER — Encounter: Payer: Self-pay | Admitting: *Deleted
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
## 2020-12-25 ENCOUNTER — Encounter: Payer: Self-pay | Admitting: *Deleted

## 2020-12-26 NOTE — Congregational Nurse Program (Signed)
  Dept: [phone]   Congregational Nurse Program Note  Date of Encounter: 12/25/2020  Past Medical History: Past Medical History:  Diagnosis Date   Gout 12/30/2017    Encounter Details:  CNP Questionnaire - 12/26/20 2144       Questionnaire   Do you give verbal consent to treat you today? Yes    Visit Setting Home    Location Patient Served At [facility]    Patient Status Immigrant    Medical Provider Yes    Insurance Medicaid    Intervention Advocate;Support    Housing/Utilities No permanent housing    Transportation Need transportation assistance    Food Have food insecurities    Medication Have medication insecurities    Referrals PCP - [place]    ED Visit Averted Yes            Met with client in his home.  Used [place] interpreter on [employer].  Client is having pain in his left wrist and is asking that this CN get him an MD appointment.  I will call [facility] to see if they can give an appointment to client.  He has been to [facility] on previous occasion, but it has been about a year since his last visit.  Will follow up after I call office for appointment.  Karene Fry, RN, MSN, [place] Office [phone] Cell

## 2020-12-31 ENCOUNTER — Ambulatory Visit: Payer: Medicaid Other | Admitting: Physician Assistant

## 2020-12-31 ENCOUNTER — Other Ambulatory Visit: Payer: Self-pay

## 2020-12-31 VITALS — BP 155/92 | HR 70 | Temp 98.2°F | Resp 18 | Ht 67.0 in | Wt 159.0 lb

## 2020-12-31 DIAGNOSIS — Z13228 Encounter for screening for other metabolic disorders: Secondary | ICD-10-CM

## 2020-12-31 DIAGNOSIS — M109 Gout, unspecified: Secondary | ICD-10-CM | POA: Diagnosis not present

## 2020-12-31 DIAGNOSIS — M25522 Pain in left elbow: Secondary | ICD-10-CM

## 2020-12-31 DIAGNOSIS — R030 Elevated blood-pressure reading, without diagnosis of hypertension: Secondary | ICD-10-CM | POA: Diagnosis not present

## 2020-12-31 DIAGNOSIS — I10 Essential (primary) hypertension: Secondary | ICD-10-CM | POA: Insufficient documentation

## 2020-12-31 DIAGNOSIS — M25521 Pain in right elbow: Secondary | ICD-10-CM | POA: Diagnosis not present

## 2020-12-31 DIAGNOSIS — Z13220 Encounter for screening for lipoid disorders: Secondary | ICD-10-CM

## 2020-12-31 DIAGNOSIS — Z125 Encounter for screening for malignant neoplasm of prostate: Secondary | ICD-10-CM

## 2020-12-31 MED ORDER — PREDNISONE 20 MG PO TABS
ORAL_TABLET | ORAL | 0 refills | Status: DC
  Filled 2020-12-31: qty 11, 9d supply, fill #0

## 2020-12-31 MED ORDER — INDOMETHACIN 50 MG PO CAPS
50.0000 mg | ORAL_CAPSULE | Freq: Three times a day (TID) | ORAL | 0 refills | Status: DC
  Filled 2020-12-31: qty 30, 10d supply, fill #0

## 2020-12-31 NOTE — Progress Notes (Signed)
Patient has not eaten today or taken medication today. Patient reports pain in the left hand with swelling, patient denies a fall, injury, bite, smash or the area. Patient reports having this concern since Friday and associating it with gout which was present 1 year ago. 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

## 2020-12-31 NOTE — Patient Instructions (Signed)
You are going to take prednisone as directed, and indomethacin 3 times a day to help with your pain.  Continue taking the indomethacin for 2 to 3 days after your pain resolves.  Please return to the mobile unit in 2 weeks for a blood pressure check and follow-up.  I do encourage you to check your blood pressure at home, keep a written log and have available for all office visits.  Roney Jaffe, PA-C Physician Assistant [facility] [email or web address]

## 2020-12-31 NOTE — Progress Notes (Signed)
Established Patient Office Visit  Subjective:  Patient ID: Brandon Arias, male    DOB: 11-19-1970  Age: 50 y.o. MRN: [id number]  CC:  Chief Complaint  Patient presents with   Gout    HPI Brandon Arias reports that he has been having pain and swelling in his left hand and wrist for the past 5 days. Denies injury or trauma.Does endorse history of gout. Has not taken anything for relief.   States that he stopped taking the allopurinol because he ran out of refills.  Elbow "swelling" starting to become bothersome, have been present for the past  4 years, denies injury / Trauma to elbows  States that he does not check his blood pressure at home, denies history of taking blood pressure medication. Denies hypertensive sxs.  Due to language barrier, an interpreter was present during the history-taking and subsequent discussion (and for part of the physical exam) with this patient.   Past Medical History:  Diagnosis Date   Gout 12/30/2017    Past Surgical History:  Procedure Laterality Date   no known surgeries      History reviewed. No pertinent family history.  Social History   Socioeconomic History   Marital status: Married    Spouse name: Not on file   Number of children: Not on file   Years of education: Not on file   Highest education level: Not on file  Occupational History   Not on file  Tobacco Use   Smoking status: Never   Smokeless tobacco: Never  Substance and Sexual Activity   Alcohol use: Yes    Comment: occ   Drug use: Never   Sexual activity: Not Currently  Other Topics Concern   Not on file  Social History Narrative   Not on file   Social Determinants of Health   Financial Resource Strain: Not on file  Food Insecurity: Not on file  Transportation Needs: Not on file  Physical Activity: Not on file  Stress: Not on file  Social Connections: Not on file  Intimate Partner Violence: Not on file    Outpatient Medications Prior to Visit   Medication Sig Dispense Refill   ibuprofen (ADVIL) 200 MG tablet Take 200 mg by mouth every 6 (six) hours as needed.     allopurinol (ZYLOPRIM) 300 MG tablet Take 1 tablet (300 mg total) by mouth daily. (Patient not taking: No sig reported) 30 tablet 6   loratadine (CLARITIN) 10 MG tablet Take 1 tablet (10 mg total) by mouth daily. (Patient not taking: No sig reported) 30 tablet 11   sodium chloride (BRONCHO SALINE) inhaler solution Take 1 spray by nebulization as needed. (Patient not taking: No sig reported) 90 mL 12   naproxen (NAPROSYN) 500 MG tablet Take 1 tablet (500 mg total) by mouth 2 (two) times daily with a meal. (Patient not taking: No sig reported) 60 tablet 2   predniSONE (DELTASONE) 20 MG tablet 3 pills the first day then 2 pills daily for 2 days, 1 pill x 2 days then 1/2 pill x 4 days. Take after eating (Patient not taking: No sig reported) 11 tablet 0   No facility-administered medications prior to visit.    No Known Allergies  ROS Review of Systems  Constitutional: Negative.   HENT: Negative.    Eyes: Negative.   Respiratory:  Negative for shortness of breath.   Cardiovascular:  Negative for chest pain.  Gastrointestinal: Negative.   Endocrine: Negative.   Genitourinary: Negative.   Musculoskeletal:  Positive for joint swelling.  Skin: Negative.   Allergic/Immunologic: Negative.   Neurological:  Negative for dizziness, weakness and headaches.  Hematological: Negative.   Psychiatric/Behavioral: Negative.       Objective:    Physical Exam Vitals and nursing note reviewed.  Constitutional:      Appearance: Normal appearance.  HENT:     Head: Normocephalic and atraumatic.     Right Ear: External ear normal.     Left Ear: External ear normal.     Nose: Nose normal.     Mouth/Throat:     Mouth: Mucous membranes are moist.     Pharynx: Oropharynx is clear.  Eyes:     Extraocular Movements: Extraocular movements intact.     Conjunctiva/sclera: Conjunctivae  normal.     Pupils: Pupils are equal, round, and reactive to light.  Cardiovascular:     Rate and Rhythm: Normal rate and regular rhythm.     Pulses: Normal pulses.     Heart sounds: Normal heart sounds.  Pulmonary:     Effort: Pulmonary effort is normal.     Breath sounds: Normal breath sounds.  Musculoskeletal:     Right elbow: Swelling present. Decreased range of motion.     Left elbow: Swelling present. Decreased range of motion.     Right wrist: Normal.     Left wrist: Swelling present. Decreased range of motion.     Right hand: Normal.     Left hand: Swelling and tenderness present. Decreased range of motion.     Cervical back: Normal range of motion and neck supple.  Skin:    General: Skin is warm and dry.  Neurological:     General: No focal deficit present.     Mental Status: He is alert and oriented to person, place, and time.  Psychiatric:        Mood and Affect: Mood normal.        Behavior: Behavior normal.        Thought Content: Thought content normal.        Judgment: Judgment normal.    BP (!) 155/92 (BP Location: Right Arm, Patient Position: Sitting, Cuff Size: Normal)   Pulse 70   Temp 98.2 F (36.8 C) (Oral)   Resp 18   Ht 5\' 7"  (1.702 m)   Wt 159 lb (72.1 kg)   SpO2 (!) 6%   BMI 24.90 kg/m  Wt Readings from Last 3 Encounters:  12/31/20 159 lb (72.1 kg)  09/23/20 155 lb 3.3 oz (70.4 kg)  12/06/19 155 lb 3.2 oz (70.4 kg)     Health Maintenance Due  Topic Date Due   COVID-19 Vaccine (1) Never done   COLONOSCOPY (Pts 45-42yrs Insurance coverage will need to be confirmed)  Never done   Zoster Vaccines- Shingrix (1 of 2) Never done   INFLUENZA VACCINE  11/25/2020    There are no preventive care reminders to display for this patient.  No results found for: TSH Lab Results  Component Value Date   WBC 8.7 06/23/2019   HGB 12.0 (L) 06/23/2019   HCT 37.1 (L) 06/23/2019   MCV 88.3 06/23/2019   PLT 144 (L) 06/23/2019   Lab Results  Component  Value Date   NA 137 09/28/2019   K 4.2 09/28/2019   CO2 21 09/28/2019   GLUCOSE 112 (H) 09/28/2019   BUN 13 09/28/2019   CREATININE 0.77 09/28/2019   BILITOT 0.7 09/28/2019   ALKPHOS 125 (H) 09/28/2019   AST 96 (  H) 09/28/2019   ALT 51 (H) 09/28/2019   PROT 7.7 09/28/2019   ALBUMIN 4.0 09/28/2019   CALCIUM 9.0 09/28/2019   ANIONGAP 16 (H) 06/23/2019   No results found for: CHOL No results found for: HDL No results found for: LDLCALC No results found for: TRIG No results found for: CHOLHDL No results found for: NUUV2Z    Assessment & Plan:   Problem List Items Addressed This Visit       Other   Gout - Primary   Relevant Medications   predniSONE (DELTASONE) 20 MG tablet   indomethacin (INDOCIN) 50 MG capsule   Elevated blood pressure reading in office without diagnosis of hypertension   Pain of both elbows   Other Visit Diagnoses     Screening PSA (prostate specific antigen)       Relevant Orders   PSA   Screening, lipid       Relevant Orders   Lipid panel   Screening for metabolic disorder       Relevant Orders   CBC with Differential/Platelet   Comp. Metabolic Panel (12)   TSH       Meds ordered this encounter  Medications   predniSONE (DELTASONE) 20 MG tablet    Sig: Take 3 tablets by mouth the first day then 2 tabs daily for 2 days, 1 tab for 2 days then 1/2 tab for 4 days. Take after eating    Dispense:  11 tablet    Refill:  0    Order Specific Question:   Supervising Provider    Answer:   Storm Frisk [[id number]]   indomethacin (INDOCIN) 50 MG capsule    Sig: Take 1 capsule (50 mg total) by mouth 3 (three) times daily with meals. Cont for 2-3 days after pain resolves    Dispense:  30 capsule    Refill:  0    Order Specific Question:   Supervising Provider    Answer:   Delford Field, PATRICK E [[id number]]  1. Acute gout of left wrist, unspecified cause Trial prednisone, indomethacin.  Patient education given on supportive care.  Fasting labs completed  today.  Patient to return to mobile unit in 2 weeks for follow-up.  Patient has upcoming appointment with primary care provider, however not until November 22.  Red flags given for prompt reevaluation - predniSONE (DELTASONE) 20 MG tablet; Take 3 tablets by mouth the first day then 2 tabs daily for 2 days, 1 tab for 2 days then 1/2 tab for 4 days. Take after eating  Dispense: 11 tablet; Refill: 0 - indomethacin (INDOCIN) 50 MG capsule; Take 1 capsule (50 mg total) by mouth 3 (three) times daily with meals. Cont for 2-3 days after pain resolves  Dispense: 30 capsule; Refill: 0  2. Elevated blood pressure reading in office without diagnosis of hypertension Patient encouraged to check blood pressure at home, keep a written log and have available for all office visits.  Patient to return to mobile unit in 2 weeks for follow-up.  3. Pain of both elbows   4. Screening PSA (prostate specific antigen)  - PSA  5. Screening, lipid  - Lipid panel  6. Screening for metabolic disorder  - CBC with Differential/Platelet - Comp. Metabolic Panel (12) - TSH    I have reviewed the patient's medical history (PMH, PSH, Social History, Family History, Medications, and allergies) , and have been updated if relevant. I spent 30 minutes reviewing chart and  face to face time with patient.  Follow-up: No follow-ups on file.    Loraine Grip Mayers, PA-C

## 2021-01-01 LAB — LIPID PANEL
Chol/HDL Ratio: 2.6 ratio (ref 0.0–5.0)
Cholesterol, Total: 78 mg/dL — ABNORMAL LOW (ref 100–199)
HDL: 30 mg/dL — ABNORMAL LOW (ref >39)
LDL Chol Calc (NIH): 34 mg/dL (ref 0–99)
Triglycerides: 58 mg/dL (ref 0–149)
VLDL Cholesterol Cal: 14 mg/dL (ref 5–40)

## 2021-01-01 LAB — CBC WITH DIFFERENTIAL/PLATELET
Basophils Absolute: 0 10*3/uL (ref 0.0–0.2)
Basos: 1 %
EOS (ABSOLUTE): 0 10*3/uL (ref 0.0–0.4)
Eos: 1 %
Hematocrit: 30.3 % — ABNORMAL LOW (ref 37.5–51.0)
Hemoglobin: 9.7 g/dL — ABNORMAL LOW (ref 13.0–17.7)
Immature Grans (Abs): 0 10*3/uL (ref 0.0–0.1)
Immature Granulocytes: 0 %
Lymphocytes Absolute: 2 10*3/uL (ref 0.7–3.1)
Lymphs: 35 %
MCH: 29.9 pg (ref 26.6–33.0)
MCHC: 32 g/dL (ref 31.5–35.7)
MCV: 94 fL (ref 79–97)
Monocytes Absolute: 0.6 10*3/uL (ref 0.1–0.9)
Monocytes: 11 %
Neutrophils Absolute: 3 10*3/uL (ref 1.4–7.0)
Neutrophils: 52 %
Platelets: 117 10*3/uL — ABNORMAL LOW (ref 150–450)
RBC: 3.24 x10E6/uL — ABNORMAL LOW (ref 4.14–5.80)
RDW: 18.1 % — ABNORMAL HIGH (ref 11.6–15.4)
WBC: 5.7 10*3/uL (ref 3.4–10.8)

## 2021-01-01 LAB — COMP. METABOLIC PANEL (12)
AST: 80 IU/L — ABNORMAL HIGH (ref 0–40)
Albumin/Globulin Ratio: 0.8 — ABNORMAL LOW (ref 1.2–2.2)
Albumin: 3.4 g/dL — ABNORMAL LOW (ref 4.0–5.0)
Alkaline Phosphatase: 123 IU/L — ABNORMAL HIGH (ref 44–121)
BUN/Creatinine Ratio: 14 (ref 9–20)
BUN: 10 mg/dL (ref 6–24)
Bilirubin Total: 0.6 mg/dL (ref 0.0–1.2)
Calcium: 8.8 mg/dL (ref 8.7–10.2)
Chloride: 103 mmol/L (ref 96–106)
Creatinine, Ser: 0.71 mg/dL — ABNORMAL LOW (ref 0.76–1.27)
Globulin, Total: 4.2 g/dL (ref 1.5–4.5)
Glucose: 72 mg/dL (ref 65–99)
Potassium: 4.1 mmol/L (ref 3.5–5.2)
Sodium: 138 mmol/L (ref 134–144)
Total Protein: 7.6 g/dL (ref 6.0–8.5)
eGFR: 112 mL/min/{1.73_m2} (ref >59)

## 2021-01-01 LAB — TSH: TSH: 0.714 u[IU]/mL (ref 0.450–4.500)

## 2021-01-01 LAB — PSA: Prostate Specific Ag, Serum: 0.7 ng/mL (ref 0.0–4.0)

## 2021-01-13 ENCOUNTER — Telehealth: Payer: Self-pay | Admitting: *Deleted

## 2021-01-13 NOTE — Telephone Encounter (Signed)
Medical Assistant used [employer] to contact patient.  Interpreter Name: Anda Kraft #: [id number] Voicemail states to give a call back to [place] with [facility] at [phone]

## 2021-01-13 NOTE — Telephone Encounter (Signed)
-----   Message from Roney Jaffe, [place] sent at 01/02/2021  3:20 PM EDT ----- Please call patient and let him know that his thyroid function and kidney function are within normal limits.  His screening for prostate cancer was negative.  He continues to have elevated liver enzymes, did have imaging of liver completed in 05/2019 that was within normal limits.  He does have worsening anemia and thrombocytopenia.  He does require follow-up testing.  Please have him return to mobile unit for nonfasting labs to be completed

## 2021-01-14 ENCOUNTER — Other Ambulatory Visit: Payer: Self-pay

## 2021-01-14 ENCOUNTER — Ambulatory Visit: Payer: Medicaid Other | Admitting: *Deleted

## 2021-01-14 ENCOUNTER — Other Ambulatory Visit: Payer: Self-pay | Admitting: Physician Assistant

## 2021-01-14 DIAGNOSIS — D649 Anemia, unspecified: Secondary | ICD-10-CM

## 2021-01-14 DIAGNOSIS — M109 Gout, unspecified: Secondary | ICD-10-CM

## 2021-01-14 DIAGNOSIS — D696 Thrombocytopenia, unspecified: Secondary | ICD-10-CM

## 2021-01-14 DIAGNOSIS — D509 Iron deficiency anemia, unspecified: Secondary | ICD-10-CM

## 2021-01-14 DIAGNOSIS — Z8739 Personal history of other diseases of the musculoskeletal system and connective tissue: Secondary | ICD-10-CM

## 2021-01-14 MED ORDER — PREDNISONE 20 MG PO TABS
ORAL_TABLET | ORAL | 0 refills | Status: DC
  Filled 2021-01-14: qty 11, 9d supply, fill #0

## 2021-01-14 MED ORDER — INDOMETHACIN 50 MG PO CAPS
50.0000 mg | ORAL_CAPSULE | Freq: Three times a day (TID) | ORAL | 0 refills | Status: DC
  Filled 2021-01-14: qty 30, 10d supply, fill #0

## 2021-01-14 NOTE — Patient Instructions (Signed)
Patient will begin another round of gout therapy and follow up with [facility] after follow up results are back.
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
## 2021-01-20 LAB — CBC WITH DIFFERENTIAL/PLATELET
Basophils Absolute: 0 10*3/uL (ref 0.0–0.2)
Basos: 1 %
EOS (ABSOLUTE): 0 10*3/uL (ref 0.0–0.4)
Eos: 0 %
Hematocrit: 32.7 % — ABNORMAL LOW (ref 37.5–51.0)
Hemoglobin: 10.3 g/dL — ABNORMAL LOW (ref 13.0–17.7)
Immature Grans (Abs): 0 10*3/uL (ref 0.0–0.1)
Immature Granulocytes: 0 %
Lymphocytes Absolute: 1.9 10*3/uL (ref 0.7–3.1)
Lymphs: 34 %
MCH: 28.9 pg (ref 26.6–33.0)
MCHC: 31.5 g/dL (ref 31.5–35.7)
MCV: 92 fL (ref 79–97)
Monocytes Absolute: 0.5 10*3/uL (ref 0.1–0.9)
Monocytes: 10 %
Neutrophils Absolute: 3.1 10*3/uL (ref 1.4–7.0)
Neutrophils: 55 %
Platelets: 160 10*3/uL (ref 150–450)
RBC: 3.57 x10E6/uL — ABNORMAL LOW (ref 4.14–5.80)
RDW: 18.2 % — ABNORMAL HIGH (ref 11.6–15.4)
WBC: 5.7 10*3/uL (ref 3.4–10.8)

## 2021-01-20 LAB — IRON,TIBC AND FERRITIN PANEL
Ferritin: 40 ng/mL (ref 30–400)
Iron Saturation: 6 % — CL (ref 15–55)
Iron: 30 ug/dL — ABNORMAL LOW (ref 38–169)
Total Iron Binding Capacity: 513 ug/dL — ABNORMAL HIGH (ref 250–450)
UIBC: 483 ug/dL — ABNORMAL HIGH (ref 111–343)

## 2021-01-20 LAB — HIV ANTIBODY (ROUTINE TESTING W REFLEX): HIV Screen 4th Generation wRfx: NONREACTIVE

## 2021-01-20 LAB — HCV INTERPRETATION

## 2021-01-20 LAB — B12 AND FOLATE PANEL
Folate: 6.6 ng/mL (ref >3.0)
Vitamin B-12: 686 pg/mL (ref 232–1245)

## 2021-01-20 LAB — HCV AB W REFLEX TO QUANT PCR: HCV Ab: 0.6 s/co ratio (ref 0.0–0.9)

## 2021-01-20 LAB — URIC ACID: Uric Acid: 11.3 mg/dL — ABNORMAL HIGH (ref 3.8–8.4)

## 2021-01-21 ENCOUNTER — Other Ambulatory Visit: Payer: Self-pay

## 2021-01-21 MED ORDER — ALLOPURINOL 100 MG PO TABS
100.0000 mg | ORAL_TABLET | Freq: Every day | ORAL | 0 refills | Status: DC
  Filled 2021-01-21: qty 30, 30d supply, fill #0

## 2021-01-21 MED ORDER — FERROUS SULFATE 325 (65 FE) MG PO TABS
325.0000 mg | ORAL_TABLET | Freq: Every day | ORAL | 1 refills | Status: DC
  Filled 2021-01-21: qty 30, 30d supply, fill #0

## 2021-01-21 NOTE — Addendum Note (Signed)
Addended by: Roney Jaffe on: 01/21/2021 02:33 PM   Modules accepted: Orders

## 2021-01-24 ENCOUNTER — Ambulatory Visit (HOSPITAL_COMMUNITY)
Admission: EM | Admit: 2021-01-24 | Discharge: 2021-01-24 | Disposition: A | Discharge status: Home / Self Care | Payer: Medicaid Other | Attending: Nurse Practitioner | Admitting: Nurse Practitioner

## 2021-01-24 ENCOUNTER — Encounter (HOSPITAL_COMMUNITY): Payer: Self-pay | Admitting: Emergency Medicine

## 2021-01-24 ENCOUNTER — Other Ambulatory Visit: Payer: Self-pay

## 2021-01-24 DIAGNOSIS — M545 Low back pain, unspecified: Secondary | ICD-10-CM | POA: Diagnosis not present

## 2021-01-24 LAB — POCT URINALYSIS DIPSTICK, ED / UC
Bilirubin Urine: NEGATIVE
Glucose, UA: NEGATIVE mg/dL
Hgb urine dipstick: NEGATIVE
Ketones, ur: NEGATIVE mg/dL
Leukocytes,Ua: NEGATIVE
Nitrite: NEGATIVE
Protein, ur: NEGATIVE mg/dL
Specific Gravity, Urine: 1.015 (ref 1.005–1.030)
Urobilinogen, UA: 1 mg/dL (ref 0.0–1.0)
pH: 7 (ref 5.0–8.0)

## 2021-01-24 MED ORDER — IBUPROFEN 800 MG PO TABS
800.0000 mg | ORAL_TABLET | Freq: Three times a day (TID) | ORAL | 0 refills | Status: DC
  Filled 2021-01-24: qty 21, 7d supply, fill #0

## 2021-01-24 MED ORDER — KETOROLAC TROMETHAMINE 60 MG/2ML IM SOLN
INTRAMUSCULAR | Status: AC
  Filled 2021-01-24: qty 2

## 2021-01-24 MED ORDER — CYCLOBENZAPRINE HCL 10 MG PO TABS
10.0000 mg | ORAL_TABLET | Freq: Two times a day (BID) | ORAL | 0 refills | Status: DC | PRN
  Filled 2021-01-24: qty 20, 10d supply, fill #0

## 2021-01-24 MED ORDER — KETOROLAC TROMETHAMINE 60 MG/2ML IM SOLN
60.0000 mg | Freq: Once | INTRAMUSCULAR | Status: AC
  Administered 2021-01-24: 60 mg via INTRAMUSCULAR

## 2021-01-24 NOTE — Discharge Instructions (Addendum)
Your urine did not show any blood or infection.  Symptoms are less likely to be due to your kidneys.  The blood test that you had with your primary care provider a couple of weeks ago did not show any issues with your kidneys  Take medications as prescribed  Alternate between ice and heat to affected areas    Go to the ED if:  You have pain that is not relieved with rest or medicine. You have increasing pain going down into your legs or buttocks. Your pain does not improve after 2 weeks. You have pain at night. You lose weight without trying. You have a fever or chills. You develop nausea or vomiting. You develop abdominal pain.

## 2021-01-24 NOTE — ED Triage Notes (Signed)
Used interpretor: Pt c/o "being ill with kidneys" being painful causing him not able to sit or stand. Started at 2 am today. Pain in pelvis and spine. Denies problems with urination. Denies any falls or injuries.

## 2021-01-24 NOTE — ED Provider Notes (Signed)
[facility]    CSN: [id number] Arrival date & time: 01/24/21  1350      History   Chief Complaint Chief Complaint  Patient presents with   Back Pain    HPI Brandon Arias is a 50 y.o. male.   Patient's primary language is [place]. Interpreter Thurston Hole [phone]   Please noted that HPI is limited as the patient is a poor historian even with interpreter. He does not directly answer the questions being asked and not very cooperative.   Subjective:  Brandon Arias is a 50 y.o. male who presents for evaluation of low back pain. The patient has had no prior back problems. Symptoms have been present since around 2 AM this morning. Onset was related to / precipitated by no known injury. The pain is located in the right lumbar area and does not radiate. The patient is unable to describe the pain. The pain is severe.  Patient states that it is difficult for him to walk due to the pain.  Pain is constant and is exacerbated by any kind of movement. Symptoms are improved by nothing. He has not tried anything for the pain.  He denies any dysuria, hematuria, abdominal pain, diarrhea, constipation, nausea, vomiting or genital pain.  Denies any groin pain, urinary/bowel incontinence or tingling of the lower extremities associated with the back pain. The patient has no "red flag" history indicative of complicated back pain.  The following portions of the patient's history were reviewed and updated as appropriate: allergies, current medications, past family history, past medical history, past social history, past surgical history, and problem list.     Past Medical History:  Diagnosis Date   Gout 12/30/2017    Patient Active Problem List   Diagnosis Date Noted   Elevated blood pressure reading in office without diagnosis of hypertension 12/31/2020   Pain of both elbows 12/31/2020   Gout 12/30/2017    Past Surgical History:  Procedure Laterality Date   no known surgeries          Home Medications    Prior to Admission medications   Medication Sig Start Date End Date Taking? Authorizing Provider  cyclobenzaprine (FLEXERIL) 10 MG tablet Take 1 tablet (10 mg total) by mouth 2 (two) times daily as needed for muscle spasms. 01/24/21  Yes Lurline Idol, FNP  ibuprofen (ADVIL) 800 MG tablet Take 1 tablet (800 mg total) by mouth 3 (three) times daily. 01/24/21  Yes Lurline Idol, FNP  allopurinol (ZYLOPRIM) 100 MG tablet Take 1 tablet (100 mg total) by mouth daily. 01/21/21   Mayers, Cari S, PA-C  ferrous sulfate 325 (65 FE) MG tablet Take 1 tablet (325 mg total) by mouth daily. 01/21/21   Mayers, Cari S, PA-C  indomethacin (INDOCIN) 50 MG capsule Take 1 capsule (50 mg total) by mouth 3 (three) times daily with meals. Cont for 2-3 days after pain resolves 01/14/21   Mayers, Cari S, PA-C  loratadine (CLARITIN) 10 MG tablet Take 1 tablet (10 mg total) by mouth daily. Patient not taking: No sig reported 08/08/18   Grayce Sessions, NP  predniSONE (DELTASONE) 20 MG tablet Take 3 tablets by mouth the first day then 2 tabs daily for 2 days, 1 tab for 2 days then 1/2 tab for 4 days. Take after eating 01/14/21   Mayers, Cari S, PA-C  sodium chloride (BRONCHO SALINE) inhaler solution Take 1 spray by nebulization as needed. Patient not taking: No sig reported 08/08/18   Grayce Sessions, NP  Family History No family history on file.  Social History Social History   Tobacco Use   Smoking status: Never   Smokeless tobacco: Never  Substance Use Topics   Alcohol use: Yes    Comment: occ   Drug use: Never     Allergies   Patient has no known allergies.   Review of Systems Review of Systems  Constitutional:  Negative for fever.  Gastrointestinal:  Negative for abdominal pain, nausea and vomiting.  Genitourinary:  Negative for dysuria and hematuria.  Musculoskeletal:  Positive for back pain and gait problem.  All other systems reviewed and are  negative.   Physical Exam Triage Vital Signs ED Triage Vitals [01/24/21 1405]  Enc Vitals Group     BP (!) 146/90     Pulse Rate 70     Resp 18     Temp 98.6 F (37 C)     Temp Source Oral     SpO2 98 %     Weight      Height      Head Circumference      Peak Flow      Pain Score 9     Pain Loc      Pain Edu?      Excl. in GC?    No data found.  Updated Vital Signs BP (!) 146/90 (BP Location: Left Arm)   Pulse 70   Temp 98.6 F (37 C) (Oral)   Resp 18   SpO2 98%   Visual Acuity Right Eye Distance:   Left Eye Distance:   Bilateral Distance:    Right Eye Near:   Left Eye Near:    Bilateral Near:     Physical Exam Vitals reviewed.  Constitutional:      General: He is not in acute distress.    Appearance: Normal appearance. He is well-developed. He is not ill-appearing, toxic-appearing or diaphoretic.  HENT:     Head: Normocephalic.  Cardiovascular:     Rate and Rhythm: Normal rate.  Pulmonary:     Effort: Pulmonary effort is normal.  Abdominal:     Palpations: Abdomen is soft.     Tenderness: There is no right CVA tenderness or left CVA tenderness.  Musculoskeletal:        General: Normal range of motion.     Cervical back: Normal, normal range of motion and neck supple.     Thoracic back: Normal.     Lumbar back: Tenderness present. No swelling, deformity or spasms. Normal range of motion.  Skin:    General: Skin is warm and dry.  Neurological:     General: No focal deficit present.     Mental Status: He is alert and oriented to person, place, and time.  Psychiatric:        Behavior: Behavior is uncooperative.     UC Treatments / Results  Labs (all labs ordered are listed, but only abnormal results are displayed) Labs Reviewed  POCT URINALYSIS DIPSTICK, ED / UC  POCT URINALYSIS DIPSTICK, ED / UC    EKG   Radiology No results found.  Procedures Procedures (including critical care time)  Medications Ordered in UC Medications   ketorolac (TORADOL) injection 60 mg (has no administration in time range)    Initial Impression / Assessment and Plan / UC Course  I have reviewed the triage vital signs and the nursing notes.  Pertinent labs & imaging results that were available during my care of the patient were reviewed  by me and considered in my medical decision making (see chart for details).    50 year old male presenting with acute right lumbar pain.  HPI limited due to the fact that the patient is a poor historian even with a [place] interpreter.  He did not answer questions directly and was uncooperative with the exam.  No focal deficits noted.  Urinalysis negative for anything acute.  Toradol injection given in the clinic.  Prescription for Advil and Flexeril provided.  Information regarding supportive care and indications for emergent follow-up given in patient's native language.  Follow-up with primary care.  Today's evaluation has revealed no signs of a dangerous process. Discussed diagnosis with patient and/or guardian. Patient and/or guardian aware of their diagnosis, possible red flag symptoms to watch out for and need for close follow up. Patient and/or guardian understands verbal and written discharge instructions. Patient and/or guardian comfortable with plan and disposition.  Patient and/or guardian has a clear mental status at this time, good insight into illness (after discussion and teaching) and has clear judgment to make decisions regarding their care  This care was provided during an unprecedented National Emergency due to the Novel Coronavirus (COVID-19) pandemic. COVID-19 infections and transmission risks place heavy strains on healthcare resources.  As this pandemic evolves, our facility, providers, and staff strive to respond fluidly, to remain operational, and to provide care relative to available resources and information. Outcomes are unpredictable and treatments are without well-defined guidelines.  Further, the impact of COVID-19 on all aspects of urgent care, including the impact to patients seeking care for reasons other than COVID-19, is unavoidable during this national emergency. At this time of the global pandemic, management of patients has significantly changed, even for non-COVID positive patients given high local and regional COVID volumes at this time requiring high healthcare system and resource utilization. The standard of care for management of both COVID suspected and non-COVID suspected patients continues to change rapidly at the local, regional, national, and global levels. This patient was worked up and treated to the best available but ever changing evidence and resources available at this current time.   Documentation was completed with the aid of voice recognition software. Transcription may contain typographical errors.   Final Clinical Impressions(s) / UC Diagnoses   Final diagnoses:  Acute right-sided low back pain without sciatica     Discharge Instructions      Your urine did not show any blood or infection.  Symptoms are less likely to be due to your kidneys.  The blood test that you had with your primary care provider a couple of weeks ago did not show any issues with your kidneys  Take medications as prescribed  Alternate between ice and heat to affected areas    Go to the ED if:  You have pain that is not relieved with rest or medicine. You have increasing pain going down into your legs or buttocks. Your pain does not improve after 2 weeks. You have pain at night. You lose weight without trying. You have a fever or chills. You develop nausea or vomiting. You develop abdominal pain.     ED Prescriptions     Medication Sig Dispense Auth. Provider   ibuprofen (ADVIL) 800 MG tablet Take 1 tablet (800 mg total) by mouth 3 (three) times daily. 21 tablet Lurline Idol, FNP   cyclobenzaprine (FLEXERIL) 10 MG tablet Take 1 tablet (10 mg total) by  mouth 2 (two) times daily as needed for muscle spasms. 20 tablet  Lurline Idol, FNP      PDMP not reviewed this encounter.   Lurline Idol, [place] 01/24/21 [phone]

## 2021-01-27 ENCOUNTER — Telehealth: Payer: Self-pay | Admitting: *Deleted

## 2021-01-27 NOTE — Telephone Encounter (Signed)
-----   Message from Roney Jaffe, [place] sent at 01/21/2021  2:32 PM EDT ----- Please call patient and let him know that he does need to restart his allopurinol at this time.  Please schedule a follow-up appointment for him to be seen by mobile team in 2 weeks.  His iron is low, he needs to start taking ferrous sulfate 325 mgm once daily, he should have his iron levels rechecked in 6 weeks.  His vitamin B12 was within normal limits, his screening for hepatitis C and HIV were negative.  Prescriptions sent to his pharmacy.

## 2021-01-27 NOTE — Telephone Encounter (Signed)
Medical Assistant used [employer] to contact patient.  Interpreter Name: Carmon Ginsberg #: [id number] Patient was not available, [employer] left patient a voicemail. Voicemail states to give a call back to [place] with [facility] at [phone]

## 2021-01-31 ENCOUNTER — Emergency Department (HOSPITAL_COMMUNITY): Payer: Medicaid Other

## 2021-01-31 ENCOUNTER — Encounter: Payer: Self-pay | Admitting: *Deleted

## 2021-01-31 ENCOUNTER — Other Ambulatory Visit: Payer: Self-pay

## 2021-01-31 ENCOUNTER — Emergency Department (HOSPITAL_COMMUNITY)
Admission: EM | Admit: 2021-01-31 | Discharge: 2021-02-01 | Disposition: A | Discharge status: Home / Self Care | Payer: Medicaid Other | Attending: Emergency Medicine | Admitting: Emergency Medicine

## 2021-01-31 DIAGNOSIS — M48061 Spinal stenosis, lumbar region without neurogenic claudication: Secondary | ICD-10-CM

## 2021-01-31 DIAGNOSIS — S73191A Other sprain of right hip, initial encounter: Secondary | ICD-10-CM

## 2021-01-31 DIAGNOSIS — M545 Low back pain, unspecified: Secondary | ICD-10-CM | POA: Insufficient documentation

## 2021-01-31 DIAGNOSIS — M549 Dorsalgia, unspecified: Secondary | ICD-10-CM

## 2021-01-31 DIAGNOSIS — M25551 Pain in right hip: Secondary | ICD-10-CM | POA: Insufficient documentation

## 2021-01-31 DIAGNOSIS — R109 Unspecified abdominal pain: Secondary | ICD-10-CM | POA: Insufficient documentation

## 2021-01-31 DIAGNOSIS — M79604 Pain in right leg: Secondary | ICD-10-CM | POA: Insufficient documentation

## 2021-01-31 DIAGNOSIS — M5136 Other intervertebral disc degeneration, lumbar region: Secondary | ICD-10-CM

## 2021-01-31 LAB — CBC WITH DIFFERENTIAL/PLATELET
Abs Immature Granulocytes: 0.03 10*3/uL (ref 0.00–0.07)
Basophils Absolute: 0.1 10*3/uL (ref 0.0–0.1)
Basophils Relative: 1 %
Eosinophils Absolute: 0.1 10*3/uL (ref 0.0–0.5)
Eosinophils Relative: 1 %
HCT: 32.6 % — ABNORMAL LOW (ref 39.0–52.0)
Hemoglobin: 10 g/dL — ABNORMAL LOW (ref 13.0–17.0)
Immature Granulocytes: 0 %
Lymphocytes Relative: 25 %
Lymphs Abs: 1.8 10*3/uL (ref 0.7–4.0)
MCH: 27.2 pg (ref 26.0–34.0)
MCHC: 30.7 g/dL (ref 30.0–36.0)
MCV: 88.8 fL (ref 80.0–100.0)
Monocytes Absolute: 0.7 10*3/uL (ref 0.1–1.0)
Monocytes Relative: 10 %
Neutro Abs: 4.5 10*3/uL (ref 1.7–7.7)
Neutrophils Relative %: 63 %
Platelets: 186 10*3/uL (ref 150–400)
RBC: 3.67 MIL/uL — ABNORMAL LOW (ref 4.22–5.81)
RDW: 18.9 % — ABNORMAL HIGH (ref 11.5–15.5)
WBC: 7.1 10*3/uL (ref 4.0–10.5)
nRBC: 0 % (ref 0.0–0.2)

## 2021-01-31 LAB — LIPASE, BLOOD: Lipase: 28 U/L (ref 11–51)

## 2021-01-31 LAB — BASIC METABOLIC PANEL
Anion gap: 8 (ref 5–15)
BUN: 11 mg/dL (ref 6–20)
CO2: 24 mmol/L (ref 22–32)
Calcium: 9 mg/dL (ref 8.9–10.3)
Chloride: 104 mmol/L (ref 98–111)
Creatinine, Ser: 0.63 mg/dL (ref 0.61–1.24)
GFR, Estimated: >60 mL/min (ref >60)
Glucose, Bld: 83 mg/dL (ref 70–99)
Potassium: 4 mmol/L (ref 3.5–5.1)
Sodium: 136 mmol/L (ref 135–145)

## 2021-01-31 MED ORDER — OXYCODONE-ACETAMINOPHEN 5-325 MG PO TABS: 1.0000 | ORAL_TABLET | Freq: Once | ORAL | Status: DC

## 2021-01-31 NOTE — ED Triage Notes (Signed)
Using [place] interpreter pt reports bilateral flank pain x 1 week, worse with movement. Denies urinary problems or injury. Taking OTC meds without improvement.

## 2021-01-31 NOTE — ED Provider Notes (Signed)
Emergency Medicine Provider Triage Evaluation Note  Brandon Arias , a 50 y.o. male  was evaluated in triage.  Pt complains of R flank pain.  Review of Systems  Positive: R flank pain, back pain Negative: No urinary sxs, fever, hematuria  Physical Exam  BP 124/87 (BP Location: Right Arm)   Pulse 78   Temp 98.2 F (36.8 C)   Resp 16   SpO2 99%  Gen:   Awake, appears uncomfortable Resp:  Normal effort  MSK:   Moves extremities without difficulty  Other:  TTP R flank and R lumbar paraspinal region, normal skin, negative straight leg raise  Medical Decision Making  Medically screening exam initiated at 1:27 PM.  Appropriate orders placed.  Brandon Arias was informed that the remainder of the evaluation will be completed by another provider, this initial triage assessment does not replace that evaluation, and the importance of remaining in the ED until their evaluation is complete.  Pain to R flank for nearly a weak, worsen with palpation and movement.  Likely MSK, however due to language barrier, will assess for potential kidney stone.  Did report a fall recently, Lspine no charge order.    Fayrene Helper, PA-C 01/31/21 1329    Ernie Avena, MD 01/31/21 [phone]

## 2021-01-31 NOTE — Congregational Nurse Program (Signed)
  Dept: [phone]   Congregational Nurse Program Note  Date of Encounter: 01/31/2021  Past Medical History: Past Medical History:  Diagnosis Date   Gout 12/30/2017    Encounter Details:  CNP Questionnaire - 01/31/21 1130       Questionnaire   Do you give verbal consent to treat you today? Yes    Location Patient Served  [facility]    Visit Setting Home    Patient Status Immigrant    Insurance [facility]    Insurance Referral N/A    Medication N/A    Medical Provider Yes    Screening Referrals N/A    Medical Referral ED    Medical Appointment Made N/A    Food Have Food Insecurities    Transportation Need transportation assistance    Housing/Utilities No permanent housing    Interpersonal Safety N/A    Intervention Advocate;Support    ED Visit Averted N/A    Life-Saving Intervention Made Yes            Client called this CN to report continued back and right hip pain.  He has been to urgent care recently, but he is no better.  Client wanted to go to [facility] ED.  This CN arranged transportation to [facility] ED for further evaluation and workup.  Client to call this CN when his visit is complete.  Roderic Palau, RN, MSN, CNP [phone] Office [phone] Cell

## 2021-02-01 ENCOUNTER — Emergency Department (HOSPITAL_COMMUNITY): Payer: Medicaid Other

## 2021-02-01 LAB — URINALYSIS, ROUTINE W REFLEX MICROSCOPIC
Bilirubin Urine: NEGATIVE
Glucose, UA: NEGATIVE mg/dL
Hgb urine dipstick: NEGATIVE
Ketones, ur: NEGATIVE mg/dL
Leukocytes,Ua: NEGATIVE
Nitrite: NEGATIVE
Protein, ur: NEGATIVE mg/dL
Specific Gravity, Urine: 1.025 (ref 1.005–1.030)
pH: 5 (ref 5.0–8.0)

## 2021-02-01 MED ORDER — HYDROCODONE-ACETAMINOPHEN 5-325 MG PO TABS
1.0000 | ORAL_TABLET | Freq: Once | ORAL | Status: AC
  Administered 2021-02-01: 1 via ORAL
  Filled 2021-02-01: qty 1

## 2021-02-01 MED ORDER — METHOCARBAMOL 500 MG PO TABS
1000.0000 mg | ORAL_TABLET | Freq: Once | ORAL | Status: AC
  Administered 2021-02-01: 1000 mg via ORAL
  Filled 2021-02-01: qty 2

## 2021-02-01 MED ORDER — PREDNISONE 50 MG PO TABS: ORAL_TABLET | ORAL | 0 refills | Status: DC

## 2021-02-01 MED ORDER — METHOCARBAMOL 500 MG PO TABS: 500.0000 mg | ORAL_TABLET | Freq: Three times a day (TID) | ORAL | 0 refills | Status: DC | PRN

## 2021-02-01 MED ORDER — PREDNISONE 50 MG PO TABS
ORAL_TABLET | ORAL | 0 refills | Status: DC
  Filled 2021-02-01: qty 5, fill #0

## 2021-02-01 MED ORDER — KETOROLAC TROMETHAMINE 30 MG/ML IJ SOLN
30.0000 mg | Freq: Once | INTRAMUSCULAR | Status: AC
  Administered 2021-02-01: 30 mg via INTRAMUSCULAR
  Filled 2021-02-01: qty 1

## 2021-02-01 MED ORDER — PREDNISONE 20 MG PO TABS
60.0000 mg | ORAL_TABLET | Freq: Once | ORAL | Status: AC
  Administered 2021-02-01: 60 mg via ORAL
  Filled 2021-02-01: qty 3

## 2021-02-01 MED ORDER — METHOCARBAMOL 500 MG PO TABS
500.0000 mg | ORAL_TABLET | Freq: Three times a day (TID) | ORAL | 0 refills | Status: DC | PRN
  Filled 2021-02-01: qty 20, 7d supply, fill #0

## 2021-02-01 NOTE — ED Notes (Signed)
Pt returned to MRI, nad noted, pt resting

## 2021-02-01 NOTE — ED Notes (Signed)
Patient transported to MRI 

## 2021-02-01 NOTE — ED Provider Notes (Signed)
[facility] EMERGENCY DEPARTMENT Provider Note   CSN: [id number] Arrival date & time: 01/31/21  1157     History Chief Complaint  Patient presents with   Flank Pain    Brandon Arias is a 50 y.o. male.  Level 5 caveat for language barrier.  [place] Nurse, learning disability used.  Patient with a history of hypertension and gout here with right-sided low back pain for the past 1 week.  States he has had back problems in the past but not like this.  Denies any fall or injury.  The pain is in his right lower "hip area" with no radiation of the pain down his buttock or leg.  There is no weakness, numbness or tingling.  No pain with urination or blood in the urine.  No fever or vomiting.  No bowel or bladder incontinence.  No saddle anesthesia.  No previous history of back surgery. He was seen in urgent care about a week ago given ibuprofen and Tylenol without relief.  States he is having increased pain since then with difficulty with moving.  Pain is worse when he tries to move around and worse when he tries to urinate. Denies any history of cancer or IV drug abuse.  The history is provided by the patient. The history is limited by a language barrier. A language interpreter was used.  Flank Pain Pertinent negatives include no chest pain, no abdominal pain, no headaches and no shortness of breath.      Past Medical History:  Diagnosis Date   Gout 12/30/2017    Patient Active Problem List   Diagnosis Date Noted   Elevated blood pressure reading in office without diagnosis of hypertension 12/31/2020   Pain of both elbows 12/31/2020   Gout 12/30/2017    Past Surgical History:  Procedure Laterality Date   no known surgeries         No family history on file.  Social History   Tobacco Use   Smoking status: Never   Smokeless tobacco: Never  Substance Use Topics   Alcohol use: Yes    Comment: occ   Drug use: Never    Home Medications Prior to Admission medications    Medication Sig Start Date End Date Taking? Authorizing Provider  allopurinol (ZYLOPRIM) 100 MG tablet Take 1 tablet (100 mg total) by mouth daily. 01/21/21   Mayers, Cari S, PA-C  cyclobenzaprine (FLEXERIL) 10 MG tablet Take 1 tablet (10 mg total) by mouth 2 (two) times daily as needed for muscle spasms. 01/24/21   Lurline Idol, FNP  ferrous sulfate 325 (65 FE) MG tablet Take 1 tablet (325 mg total) by mouth daily. 01/21/21   Mayers, Cari S, PA-C  ibuprofen (ADVIL) 800 MG tablet Take 1 tablet (800 mg total) by mouth 3 (three) times daily. 01/24/21   Lurline Idol, FNP  indomethacin (INDOCIN) 50 MG capsule Take 1 capsule (50 mg total) by mouth 3 (three) times daily with meals. Cont for 2-3 days after pain resolves 01/14/21   Mayers, Cari S, PA-C  loratadine (CLARITIN) 10 MG tablet Take 1 tablet (10 mg total) by mouth daily. Patient not taking: No sig reported 08/08/18   Grayce Sessions, NP  predniSONE (DELTASONE) 20 MG tablet Take 3 tablets by mouth the first day then 2 tabs daily for 2 days, 1 tab for 2 days then 1/2 tab for 4 days. Take after eating 01/14/21   Mayers, Cari S, PA-C  sodium chloride (BRONCHO SALINE) inhaler solution Take 1 spray  by nebulization as needed. Patient not taking: No sig reported 08/08/18   Grayce Sessions, NP    Allergies    Patient has no known allergies.  Review of Systems   Review of Systems  Constitutional:  Negative for activity change, appetite change and fever.  HENT:  Negative for congestion and rhinorrhea.   Respiratory:  Negative for cough, chest tightness and shortness of breath.   Cardiovascular:  Negative for chest pain.  Gastrointestinal:  Negative for abdominal pain, nausea and vomiting.  Genitourinary:  Positive for dysuria and flank pain.  Musculoskeletal:  Positive for back pain. Negative for arthralgias and myalgias.  Skin:  Negative for rash.  Neurological:  Negative for dizziness, weakness and headaches.   all other systems are  negative except as noted in the HPI and PMH.   Physical Exam Updated Vital Signs BP 139/73   Pulse 75   Temp 98.2 F (36.8 C)   Resp 17   SpO2 100%   Physical Exam Vitals and nursing note reviewed.  Constitutional:      General: He is not in acute distress.    Appearance: He is well-developed.  HENT:     Head: Normocephalic and atraumatic.     Mouth/Throat:     Pharynx: No oropharyngeal exudate.  Eyes:     Conjunctiva/sclera: Conjunctivae normal.     Pupils: Pupils are equal, round, and reactive to light.  Neck:     Comments: No meningismus. Cardiovascular:     Rate and Rhythm: Normal rate and regular rhythm.     Heart sounds: Normal heart sounds. No murmur heard. Pulmonary:     Effort: Pulmonary effort is normal. No respiratory distress.     Breath sounds: Normal breath sounds.  Abdominal:     Palpations: Abdomen is soft.     Tenderness: There is no abdominal tenderness. There is no guarding or rebound.  Musculoskeletal:        General: Tenderness present. Normal range of motion.     Cervical back: Normal range of motion and neck supple.     Comments: Right paraspinal lumbar tenderness, no midline tenderness  5/5 strength in bilateral lower extremities. Ankle plantar and dorsiflexion intact. Great toe extension intact bilaterally. +2 DP and PT pulses. +2 patellar reflexes bilaterally. Normal gait.   Skin:    General: Skin is warm.  Neurological:     Mental Status: He is alert and oriented to person, place, and time.     Cranial Nerves: No cranial nerve deficit.     Motor: No abnormal muscle tone.     Coordination: Coordination normal.     Comments:  5/5 strength throughout. CN 2-12 intact.Equal grip strength.   Psychiatric:        Behavior: Behavior normal.    ED Results / Procedures / Treatments   Labs (all labs ordered are listed, but only abnormal results are displayed) Labs Reviewed  CBC WITH DIFFERENTIAL/PLATELET - Abnormal; Notable for the following  components:      Result Value   RBC 3.67 (*)    Hemoglobin 10.0 (*)    HCT 32.6 (*)    RDW 18.9 (*)    All other components within normal limits  URINALYSIS, ROUTINE W REFLEX MICROSCOPIC - Abnormal; Notable for the following components:   Color, Urine AMBER (*)    APPearance CLOUDY (*)    All other components within normal limits  LIPASE, BLOOD  BASIC METABOLIC PANEL    EKG None  Radiology CT  L-SPINE NO CHARGE  Result Date: 01/31/2021 CLINICAL DATA:  Low back and right-sided flank pain. EXAM: CT LUMBAR SPINE WITHOUT CONTRAST TECHNIQUE: Multidetector CT imaging of the lumbar spine was performed without intravenous contrast administration. Multiplanar CT image reconstructions were also generated. COMPARISON:  None. FINDINGS: Segmentation: 5 lumbar type vertebrae. Alignment: Normal. Vertebrae: No acute fracture or suspicious osseous lesion. Degenerative endplate changes at L4-5 including small Schmorl's nodes. Paraspinal and other soft tissues: No acute abnormality in the paraspinal soft tissues. Intra-abdominal and pelvic contents reported separately. Disc levels: Lumbar disc degeneration is greatest at L4-5 where right eccentric disc bulging, endplate spurring, and mild disc space height loss result in mild spinal stenosis, right greater than left lateral recess stenosis, and moderate to severe right greater than left neural foraminal stenosis with potential right L5 nerve root impingement. No significant spinal stenosis is evident elsewhere. Neural foraminal stenosis is mild-to-moderate at L4-5 and mild at L2-3 and L3-4. IMPRESSION: 1. No acute osseous abnormality. 2. 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 Electronically Signed   By: Sebastian Ache M.D.   On: 01/31/2021 17:01   CT RENAL STONE STUDY  Result Date: 01/31/2021 CLINICAL DATA:  Low back and right-sided flank pain.  EXAM: CT ABDOMEN AND PELVIS WITHOUT CONTRAST TECHNIQUE: Multidetector CT imaging of the abdomen and pelvis was performed following the standard protocol without IV contrast. COMPARISON:  Right upper quadrant abdominal ultrasound 06/15/2019 FINDINGS: Lower chest: Minimal atelectasis in the left lung base. No pleural effusion. Hepatobiliary: Hepatic steatosis. Unremarkable gallbladder. No biliary dilatation. Pancreas: Unremarkable. Spleen: Unremarkable. Adrenals/Urinary Tract: Unremarkable adrenal glands. No evidence of renal mass, calculi, or hydronephrosis. No ureteral dilatation. Unremarkable bladder. Stomach/Bowel: The stomach is unremarkable. There is no evidence of bowel obstruction or inflammation. The appendix is unremarkable. Vascular/Lymphatic: Normal caliber of the abdominal aorta. No enlarged lymph nodes. Reproductive: Unremarkable prostate. Other: No ascites or pneumoperitoneum. Musculoskeletal: No acute osseous abnormality or suspicious osseous lesion. Lumbar spine reported separately. IMPRESSION: 1. No acute abnormality identified in the abdomen or pelvis. 2. Hepatic steatosis. Electronically Signed   By: Sebastian Ache M.D.   On: 01/31/2021 16:12    Procedures Procedures   Medications Ordered in ED Medications  oxyCODONE-acetaminophen (PERCOCET/ROXICET) 5-325 MG per tablet 1 tablet (1 tablet Oral Not Given 02/01/21 0400)  ketorolac (TORADOL) 30 MG/ML injection 30 mg (30 mg Intramuscular Given 02/01/21 0408)  HYDROcodone-acetaminophen (NORCO/VICODIN) 5-325 MG per tablet 1 tablet (1 tablet Oral Given 02/01/21 0408)  predniSONE (DELTASONE) tablet 60 mg (60 mg Oral Given 02/01/21 0525)  methocarbamol (ROBAXIN) tablet 1,000 mg (1,000 mg Oral Given 02/01/21 6734)    ED Course  I have reviewed the triage vital signs and the nursing notes.  Pertinent labs & imaging results that were available during my care of the patient were reviewed by me and considered in my medical decision making (see chart for  details).    MDM Rules/Calculators/A&P                          1 week of atraumatic right-sided back pain.  Neurologically intact.  Equal strength, sensation, pulses and reflexes bilaterally.  Low suspicion for cord compression or cauda equina. Urinalysis is negative.  CT renal study obtained in triage is negative.  Does show some foraminal stenosis at L4/5  Patient given antiinflammatories and muscle relaxers.  He remains unable to ambulate  with perceived weakness in the leg though does not have any objective weakness. Still low suspicion for cord compression or cauda equina but given his difficulty walking we will proceed with MRI.  Care to be transferred at shift change Final Clinical Impression(s) / ED Diagnoses Final diagnoses:  Back pain    Rx / DC Orders ED Discharge Orders     None        Bobbye Petti, Jeannett Senior, MD 02/01/21 [phone]

## 2021-02-01 NOTE — ED Notes (Signed)
Pt ambulated to the bathroom with crutches.

## 2021-02-01 NOTE — Discharge Instructions (Addendum)
There there is no evidence of a serious surgical pathology with your spinal cord.  Take the anti-inflammatories and muscle relaxers as prescribed.  Follow-up with your doctor.  Return to the ED with worsening pain, weakness, numbness, tingling, bowel or bladder incontinence, any other concerns

## 2021-02-01 NOTE — Care Management (Signed)
Patient was able to ambulate to BR with crutches, He will need to follow up with PCP for further issues.

## 2021-02-01 NOTE — ED Provider Notes (Signed)
  87M presenting with R flank pain and hip pain for the past week. Patient endorsing back pain with radicular symptoms. CT L-spine revealed lumbar disc degeneration worst at L4-5 with mild spinal stenosis and moderate to severe right greater than left neural foraminal stenosis.  MRI spine and hip pending as the patient was unable to ambulate, with perceived weakness of the leg. Plan for likely DC as no red flag signs/symptoms present currently.      Ernie Avena, MD 02/01/21 [phone]

## 2021-02-03 ENCOUNTER — Other Ambulatory Visit: Payer: Self-pay

## 2021-02-19 ENCOUNTER — Other Ambulatory Visit: Payer: Self-pay

## 2021-02-19 ENCOUNTER — Encounter (HOSPITAL_COMMUNITY): Payer: Self-pay

## 2021-02-19 ENCOUNTER — Ambulatory Visit (HOSPITAL_COMMUNITY)
Admission: EM | Admit: 2021-02-19 | Discharge: 2021-02-19 | Disposition: A | Discharge status: Home / Self Care | Payer: Medicaid Other | Attending: Emergency Medicine | Admitting: Emergency Medicine

## 2021-02-19 DIAGNOSIS — G8929 Other chronic pain: Secondary | ICD-10-CM

## 2021-02-19 DIAGNOSIS — M109 Gout, unspecified: Secondary | ICD-10-CM

## 2021-02-19 DIAGNOSIS — Z760 Encounter for issue of repeat prescription: Secondary | ICD-10-CM

## 2021-02-19 MED ORDER — PREDNISONE 10 MG PO TABS
ORAL_TABLET | ORAL | 0 refills | Status: DC
  Filled 2021-02-19: qty 25, 5d supply, fill #0

## 2021-02-19 MED ORDER — INDOMETHACIN 50 MG PO CAPS
50.0000 mg | ORAL_CAPSULE | Freq: Three times a day (TID) | ORAL | 0 refills | Status: DC
  Filled 2021-02-19: qty 30, 10d supply, fill #0

## 2021-02-19 MED ORDER — METHOCARBAMOL 500 MG PO TABS
500.0000 mg | ORAL_TABLET | Freq: Three times a day (TID) | ORAL | 0 refills | Status: DC | PRN
  Filled 2021-02-19: qty 20, 7d supply, fill #0

## 2021-02-19 NOTE — ED Triage Notes (Signed)
Pt lower back pain x 1 month. States pain improved in the past weeks, as he was using crutches for walk. Ibuprofen gives relief. Pt wanted to know  if he can go back to work.

## 2021-02-20 ENCOUNTER — Other Ambulatory Visit: Payer: Self-pay

## 2021-03-18 ENCOUNTER — Ambulatory Visit: Payer: Medicaid Other | Admitting: Family Medicine

## 2021-03-19 ENCOUNTER — Encounter: Payer: Self-pay | Admitting: *Deleted

## 2021-03-19 NOTE — Congregational Nurse Program (Signed)
  Dept: [phone]   Congregational Nurse Program Note  Date of Encounter: 03/19/2021  Past Medical History: Past Medical History:  Diagnosis Date   Gout 12/30/2017    Encounter Details:  CNP Questionnaire - 03/19/21 1030       Questionnaire   Do you give verbal consent to treat you today? Yes    Location Patient Served  [facility]    Visit Setting Home    Patient Status Immigrant    Insurance [facility]    Insurance Referral N/A    Medication N/A    Medical Provider Yes    Screening Referrals N/A    Medical Referral N/A    Medical Appointment Made [facility] PCP[facility]clinic    Food Have Food Insecurities    Transportation Need transportation assistance    Housing/Utilities No permanent housing    Interpersonal Safety N/A    Intervention Advocate;Support;Navigate Healthcare System;Educate    ED Visit Averted N/A    Life-Saving Intervention Made Yes            Client missed his scheduled MD appointment yesterday.  Client tells this CN that he had to be in court with his son yesterday.  Client requested that this CN reschedule appointment with [facility] MD.  Appointment rescheduled for January 11 at 10:30 am.  This CN has texted client with new appointment and will follow up with client again next week with appointment information.  Roderic Palau, RN, MSN, CNP [phone] Office [phone] Cell

## 2021-03-31 ENCOUNTER — Other Ambulatory Visit: Payer: Self-pay

## 2021-03-31 ENCOUNTER — Other Ambulatory Visit: Payer: Self-pay | Admitting: Emergency Medicine

## 2021-03-31 DIAGNOSIS — M109 Gout, unspecified: Secondary | ICD-10-CM

## 2021-04-01 ENCOUNTER — Other Ambulatory Visit: Payer: Self-pay

## 2021-04-02 ENCOUNTER — Encounter: Payer: Self-pay | Admitting: Physician Assistant

## 2021-04-02 ENCOUNTER — Encounter: Payer: Self-pay | Admitting: *Deleted

## 2021-04-02 ENCOUNTER — Other Ambulatory Visit: Payer: Self-pay

## 2021-04-02 ENCOUNTER — Other Ambulatory Visit: Payer: Self-pay | Admitting: Critical Care Medicine

## 2021-04-02 DIAGNOSIS — Z8739 Personal history of other diseases of the musculoskeletal system and connective tissue: Secondary | ICD-10-CM

## 2021-04-02 DIAGNOSIS — M109 Gout, unspecified: Secondary | ICD-10-CM

## 2021-04-02 MED ORDER — ALLOPURINOL 100 MG PO TABS
100.0000 mg | ORAL_TABLET | Freq: Every day | ORAL | 1 refills | Status: DC
  Filled 2021-04-02: qty 60, 60d supply, fill #0

## 2021-04-02 MED ORDER — INDOMETHACIN 50 MG PO CAPS
50.0000 mg | ORAL_CAPSULE | Freq: Three times a day (TID) | ORAL | 1 refills | Status: DC
  Filled 2021-04-02: qty 60, 20d supply, fill #0
  Filled 2021-04-25: qty 60, 20d supply, fill #1

## 2021-04-02 MED ORDER — METHOCARBAMOL 500 MG PO TABS
500.0000 mg | ORAL_TABLET | Freq: Three times a day (TID) | ORAL | 1 refills | Status: DC | PRN
  Filled 2021-04-02: qty 90, 30d supply, fill #0
  Filled 2021-04-28: qty 90, 30d supply, fill #1

## 2021-04-02 MED ORDER — PREDNISONE 10 MG PO TABS
ORAL_TABLET | ORAL | 0 refills | Status: DC
  Filled 2021-04-02: qty 25, 5d supply, fill #0

## 2021-04-02 NOTE — Progress Notes (Signed)
error 

## 2021-04-02 NOTE — Congregational Nurse Program (Signed)
  Dept: [phone]   Congregational Nurse Program Note  Date of Encounter: 04/02/2021  Past Medical History: Past Medical History:  Diagnosis Date   Gout 12/30/2017    Encounter Details:  CNP Questionnaire - 04/02/21 1400       Questionnaire   Do you give verbal consent to treat you today? Yes    Location Patient Served  [facility]    Visit Setting Home;MD Office    Patient Status Immigrant    Insurance [facility]    Insurance Referral N/A    Medication N/A    Medical Provider Yes    Screening Referrals N/A    Medical Referral Other   [facility]   Medical Appointment Made [facility] PCP[facility]clinic    Food Have Food Insecurities    Transportation Need transportation assistance;Provided transportation assistance    Housing/Utilities N/A    Interpersonal Safety N/A    Intervention Advocate;Support;Navigate Healthcare System;Educate    ED Visit Averted N/A    Life-Saving Intervention Made Yes            Client contacted this CN by telephone yesterday to assist with medication refills.  However, no refills available on prescriptions after call placed to [facility] pharmacy.  Transportation provided to [facility] today to see Dr. Delford Field.  Prescriptions picked up from [facility] pharmacy.  Educated client regarding four prescriptions today.  Assisted client to refill his bus pass today.  Transportation provided back home for client after all tasks completed.  Client utilizing the cane provided to him by Dr. Delford Field today.  Will follow up with client as needed.  Marland Kitcheneml

## 2021-04-03 ENCOUNTER — Encounter: Payer: Self-pay | Admitting: Critical Care Medicine

## 2021-04-08 ENCOUNTER — Telehealth: Payer: Self-pay | Admitting: Family Medicine

## 2021-04-08 NOTE — Telephone Encounter (Signed)
Brandon Arias called in for attorney, Sena Slate, states, patient needs laetter stting why he couldnt come to court on 11/22 besides back pain, or he will be arrested

## 2021-04-08 NOTE — Telephone Encounter (Signed)
Routing to MD.

## 2021-04-09 NOTE — Telephone Encounter (Signed)
The only reason for him to miss his court appearance would have been his back pain  I have only seen this patient 1 time at the [place] [facility] briefly  I have no other information to base any other reasons to miss his appointment in court

## 2021-04-10 ENCOUNTER — Other Ambulatory Visit: Payer: Self-pay

## 2021-04-10 NOTE — Telephone Encounter (Signed)
Late entry, Attorney office was called and a voicemail was left informing her to return phone call.

## 2021-04-25 ENCOUNTER — Encounter: Payer: Self-pay | Admitting: *Deleted

## 2021-04-25 NOTE — Congregational Nurse Program (Signed)
°  Dept: [phone]   Congregational Nurse Program Note  Date of Encounter: 04/25/2021  Past Medical History: Past Medical History:  Diagnosis Date   Gout 12/30/2017    Encounter Details:  CNP Questionnaire - 04/25/21 1435       Questionnaire   Do you give verbal consent to treat you today? Yes    Location Patient Served  [facility]    Visit Setting Home    Patient Status Immigrant    Insurance [facility]    Insurance Referral N/A    Medication N/A    Medical Provider Yes    Screening Referrals N/A    Medical Referral Other   [facility]   Medical Appointment Made N/A    Food Have Food Insecurities    Transportation Need transportation assistance    Housing/Utilities No permanent housing    Interpersonal Safety N/A    Intervention Advocate;Support;Navigate Healthcare System;Educate    ED Visit Averted N/A    Life-Saving Intervention Made Yes            Received call from client requesting assistance with medication refills.  Client attempted to text prescription numbers to this CN.  Call placed by this CN to [facility] pharmacy and message left that client needs medication refills.  This CN reminded client of appointment Jan 11.  This CN will follow up with client and [facility] pharmacy next week as needed.  Roderic Palau, RN, MSN, CNP [phone] Office [phone] Cell

## 2021-04-28 ENCOUNTER — Other Ambulatory Visit: Payer: Self-pay | Admitting: Critical Care Medicine

## 2021-04-28 ENCOUNTER — Other Ambulatory Visit: Payer: Self-pay

## 2021-05-07 ENCOUNTER — Ambulatory Visit: Payer: Medicaid Other | Admitting: Critical Care Medicine

## 2021-05-31 NOTE — Progress Notes (Signed)
Established Patient Office Visit  Subjective:  Patient ID: Brandon Arias, male    DOB: 1970-10-19  Age: 51 y.o. MRN: [id number]  CC:  Chief Complaint  Patient presents with   Flank Pain    HPI Brandon Arias presents for evaluation of right lower back pain which the patient subscribes to being his kidney.  This visit was assisted with [place] interpreter Renae Fickle [phone] on video  Patient originally is from [place] and was brought to the clinic under the guidance of the Congregational and community nurse program.  The patient has had 3 COVID vaccinations on file.  Patient has a history of tophaceous gout involving both elbows for which she has had multiple encounters in urgent care emergency room and other providers.  The patient is due colon cancer screening.  He does now have Medicaid.  Patient does drink alcohol with various parties and birthday events on weekends.  He has been in the emergency room for this previously with elevated liver function tests.  He now complains of difficulty with balance and carrying objects he is not able to work.  He has severe pain in the lower back which results in difficulty standing and sitting.  He also has gout flares in the elbows and states the indomethacin has been given and the low-dose allopurinol has not been of any benefit.  He does have a very high uric acid level but no renal insufficiency.  He also is requesting some help in his home environment.  He has been on prednisone Indocin and Allender Purinol along with methocarbamol without much relief at this time.  Patient has no other complaints.  Past Medical History:  Diagnosis Date   Gout 12/30/2017    Past Surgical History:  Procedure Laterality Date   no known surgeries      History reviewed. No pertinent family history.  Social History   Socioeconomic History   Marital status: Married    Spouse name: Not on file   Number of children: Not on file   Years of education: Not on  file   Highest education level: Not on file  Occupational History   Not on file  Tobacco Use   Smoking status: Never   Smokeless tobacco: Never  Substance and Sexual Activity   Alcohol use: Yes    Comment: occ   Drug use: Never   Sexual activity: Not Currently  Other Topics Concern   Not on file  Social History Narrative   Not on file   Social Determinants of Health   Financial Resource Strain: Not on file  Food Insecurity: Not on file  Transportation Needs: Not on file  Physical Activity: Not on file  Stress: Not on file  Social Connections: Not on file  Intimate Partner Violence: Not on file    Outpatient Medications Prior to Visit  Medication Sig Dispense Refill   methocarbamol (ROBAXIN) 500 MG tablet Take 1 tablet (500 mg total) by mouth every 8 (eight) hours as needed for muscle spasms. 90 tablet 1   allopurinol (ZYLOPRIM) 100 MG tablet Take 1 tablet (100 mg total) by mouth daily. (Patient not taking: Reported on 06/02/2021) 60 tablet 1   ferrous sulfate 325 (65 FE) MG tablet Take 1 tablet (325 mg total) by mouth daily. (Patient not taking: Reported on 06/02/2021) 30 tablet 1   indomethacin (INDOCIN) 50 MG capsule Take 1 capsule (50 mg total) by mouth 3 (three) times daily with meals. Cont for 2-3 days after pain resolves (Patient not  taking: Reported on 06/02/2021) 60 capsule 1   loratadine (CLARITIN) 10 MG tablet Take 1 tablet (10 mg total) by mouth daily. (Patient not taking: Reported on 08/25/2019) 30 tablet 11   predniSONE (DELTASONE) 10 MG tablet Take 5 tablets by mouth daily (Patient not taking: Reported on 06/02/2021) 25 tablet 0   sodium chloride (BRONCHO SALINE) inhaler solution Take 1 spray by nebulization as needed. (Patient not taking: No sig reported) 90 mL 12   No facility-administered medications prior to visit.    No Known Allergies  ROS Review of Systems  Constitutional:  Negative for chills, diaphoresis and fever.  HENT:  Negative for congestion, hearing  loss, nosebleeds, sore throat and tinnitus.   Eyes:  Negative for photophobia and redness.  Respiratory:  Negative for cough, shortness of breath, wheezing and stridor.   Cardiovascular:  Negative for chest pain, palpitations and leg swelling.  Gastrointestinal:  Negative for abdominal pain, blood in stool, constipation, diarrhea, nausea and vomiting.  Endocrine: Negative for polydipsia.  Genitourinary:  Positive for flank pain. Negative for dysuria, frequency, hematuria and urgency.  Musculoskeletal:  Positive for arthralgias, back pain, gait problem and joint swelling. Negative for myalgias and neck pain.  Skin:  Negative for rash.  Allergic/Immunologic: Negative for environmental allergies.  Neurological:  Negative for dizziness, tremors, seizures, weakness and headaches.  Hematological:  Does not bruise/bleed easily.  Psychiatric/Behavioral:  Negative for dysphoric mood and suicidal ideas. The patient is nervous/anxious.      Objective:    Physical Exam Vitals reviewed.  Constitutional:      General: He is not in acute distress.    Appearance: Normal appearance. He is well-developed. He is not ill-appearing, toxic-appearing or diaphoretic.     Comments: Significant push of speech somewhat angry at times restless  HENT:     Head: Normocephalic and atraumatic.     Nose: Nose normal. No nasal deformity, septal deviation, mucosal edema or rhinorrhea.     Right Sinus: No maxillary sinus tenderness or frontal sinus tenderness.     Left Sinus: No maxillary sinus tenderness or frontal sinus tenderness.     Mouth/Throat:     Mouth: Mucous membranes are moist.     Pharynx: Oropharynx is clear. No oropharyngeal exudate.     Comments: Mild dental caries Eyes:     General: No scleral icterus.    Conjunctiva/sclera: Conjunctivae normal.     Pupils: Pupils are equal, round, and reactive to light.  Neck:     Thyroid: No thyromegaly.     Vascular: No carotid bruit or JVD.     Trachea:  Trachea normal. No tracheal tenderness or tracheal deviation.  Cardiovascular:     Rate and Rhythm: Normal rate and regular rhythm.     Chest Wall: PMI is not displaced.     Pulses: Normal pulses. No decreased pulses.     Heart sounds: Normal heart sounds, S1 normal and S2 normal. Heart sounds not distant. No murmur heard. No systolic murmur is present.  No diastolic murmur is present.    No friction rub. No gallop. No S3 or S4 sounds.  Pulmonary:     Effort: No tachypnea, accessory muscle usage or respiratory distress.     Breath sounds: No stridor. No decreased breath sounds, wheezing, rhonchi or rales.  Chest:     Chest wall: No tenderness.  Abdominal:     General: Bowel sounds are normal. There is no distension.     Palpations: Abdomen is soft. Abdomen is  not rigid.     Tenderness: There is no abdominal tenderness. There is no guarding or rebound.  Musculoskeletal:        General: Tenderness present. No swelling or deformity. Normal range of motion.     Cervical back: Normal range of motion and neck supple. No edema, erythema or rigidity. No muscular tenderness. Normal range of motion.     Right lower leg: No edema.     Left lower leg: No edema.     Comments: Point tenderness in the left lower back with reproducible pain.  The patient is neurologically intact in the lower extremities.  Lymphadenopathy:     Head:     Right side of head: No submental or submandibular adenopathy.     Left side of head: No submental or submandibular adenopathy.     Cervical: No cervical adenopathy.  Skin:    General: Skin is warm and dry.     Coloration: Skin is not pale.     Findings: No rash.     Nails: There is no clubbing.  Neurological:     General: No focal deficit present.     Mental Status: He is alert and oriented to person, place, and time. Mental status is at baseline.     Cranial Nerves: No cranial nerve deficit.     Motor: No weakness.     Coordination: Coordination normal.      Gait: Gait abnormal.     Deep Tendon Reflexes: Reflexes normal.     Comments: Patient has extreme difficulty arising from a sitting position in the chair.  Psychiatric:        Mood and Affect: Mood normal.        Speech: Speech normal.        Behavior: Behavior normal.        Thought Content: Thought content normal.        Judgment: Judgment normal.    BP (!) 128/59    Resp 16    Wt 158 lb 12.8 oz (72 kg)    BMI 24.87 kg/m  Wt Readings from Last 3 Encounters:  06/02/21 158 lb 12.8 oz (72 kg)  12/31/20 159 lb (72.1 kg)  09/23/20 155 lb 3.3 oz (70.4 kg)     Health Maintenance Due  Topic Date Due   COVID-19 Vaccine (1) Never done   COLON CANCER SCREENING ANNUAL FOBT  Never done    There are no preventive care reminders to display for this patient.  Lab Results  Component Value Date   TSH 0.714 12/31/2020   Lab Results  Component Value Date   WBC 7.1 01/31/2021   HGB 10.0 (L) 01/31/2021   HCT 32.6 (L) 01/31/2021   MCV 88.8 01/31/2021   PLT 186 01/31/2021   Lab Results  Component Value Date   NA 136 01/31/2021   K 4.0 01/31/2021   CO2 24 01/31/2021   GLUCOSE 83 01/31/2021   BUN 11 01/31/2021   CREATININE 0.63 01/31/2021   BILITOT 0.6 12/31/2020   ALKPHOS 123 (H) 12/31/2020   AST 80 (H) 12/31/2020   ALT 51 (H) 09/28/2019   PROT 7.6 12/31/2020   ALBUMIN 3.4 (L) 12/31/2020   CALCIUM 9.0 01/31/2021   ANIONGAP 8 01/31/2021   EGFR 112 12/31/2020   Lab Results  Component Value Date   CHOL 78 (L) 12/31/2020   Lab Results  Component Value Date   HDL 30 (L) 12/31/2020   Lab Results  Component Value Date   LDLCALC  34 12/31/2020   Lab Results  Component Value Date   TRIG 58 12/31/2020   Lab Results  Component Value Date   CHOLHDL 2.6 12/31/2020   No results found for: HGBA1C 01/2021 MRI Lumbar: IMPRESSION: 1. At L4-5 there is a broad-based disc bulge. Mild bilateral facet arthropathy. Severe spinal stenosis. Mild bilateral foraminal stenosis.  Bilateral subarticular recess stenosis. 2.  No acute osseous injury of the lumbar spine.   Assessment & Plan:   Problem List Items Addressed This Visit       Nervous and Auditory   Lumbar radiculopathy - Primary    Previous and imaging shows disc bulge at lumbar 4 5 he may benefit from injection therapy I do not believe he needs surgery  We will refer this patient to orthopedic spine since he has Medicaid and will prescribe the meloxicam along with cyclobenzaprine as a muscle relaxant      Relevant Medications   cyclobenzaprine (FLEXERIL) 10 MG tablet   Other Relevant Orders   Ambulatory referral to Orthopedic Surgery     Musculoskeletal and Integument   Gout, tophaceous    Evidence of tophi in both olecranon bursa both elbows with some decreased range of motion of both elbows  Previous uric acid levels extremely elevated and he is only getting 100 mg daily of allopurinol  Plan increase allopurinol to 200 mg daily and give a trial of meloxicam 15 mg daily discontinue Indocin and prednisone  Referral to rheumatology was made as he does have Medicaid      Relevant Medications   Allopurinol 200 MG TABS   cyclobenzaprine (FLEXERIL) 10 MG tablet   meloxicam (MOBIC) 15 MG tablet   Other Relevant Orders   Ambulatory referral to Rheumatology     Other   Elevated blood pressure reading in office without diagnosis of hypertension    Blood pressure normal at this visit      Colon cancer screening    Patient needs colon cancer screening will issue fecal occult kit      Relevant Orders   Fecal occult blood, imunochemical   RESOLVED: Gout   Relevant Orders   Comprehensive metabolic panel   CBC with Differential/Platelet   Uric Acid   Ambulatory referral to Rheumatology   Other Visit Diagnoses     History of gout       Relevant Medications   Allopurinol 200 MG TABS       Meds ordered this encounter  Medications   Allopurinol 200 MG TABS    Sig: Take 200 mg by  mouth daily.    Dispense:  60 tablet    Refill:  3    Dose change   cyclobenzaprine (FLEXERIL) 10 MG tablet    Sig: Take 1 tablet (10 mg total) by mouth 3 (three) times daily as needed for muscle spasms.    Dispense:  90 tablet    Refill:  2   meloxicam (MOBIC) 15 MG tablet    Sig: Take 1 tablet (15 mg total) by mouth daily.    Dispense:  60 tablet    Refill:  2  38 minutes spent obtaining history and physical educating patient communicating with him about his various medical diagnoses very difficult time with language barrier which is quite significant in this patient he does understand [place] but sometimes the interpreter has a hard time communicating with the patient  Follow-up: Return in about 2 months (around 07/31/2021).    Asencion Noble, MD

## 2021-06-02 ENCOUNTER — Ambulatory Visit: Payer: Medicaid Other | Attending: Critical Care Medicine | Admitting: Critical Care Medicine

## 2021-06-02 ENCOUNTER — Other Ambulatory Visit: Payer: Self-pay

## 2021-06-02 ENCOUNTER — Encounter: Payer: Self-pay | Admitting: Critical Care Medicine

## 2021-06-02 VITALS — BP 128/59 | Resp 16 | Wt 158.8 lb

## 2021-06-02 DIAGNOSIS — Z1211 Encounter for screening for malignant neoplasm of colon: Secondary | ICD-10-CM | POA: Diagnosis not present

## 2021-06-02 DIAGNOSIS — M1A9XX1 Chronic gout, unspecified, with tophus (tophi): Secondary | ICD-10-CM

## 2021-06-02 DIAGNOSIS — M109 Gout, unspecified: Secondary | ICD-10-CM

## 2021-06-02 DIAGNOSIS — M5416 Radiculopathy, lumbar region: Secondary | ICD-10-CM

## 2021-06-02 DIAGNOSIS — R030 Elevated blood-pressure reading, without diagnosis of hypertension: Secondary | ICD-10-CM

## 2021-06-02 DIAGNOSIS — Z8739 Personal history of other diseases of the musculoskeletal system and connective tissue: Secondary | ICD-10-CM

## 2021-06-02 DIAGNOSIS — Z789 Other specified health status: Secondary | ICD-10-CM | POA: Insufficient documentation

## 2021-06-02 MED ORDER — CYCLOBENZAPRINE HCL 10 MG PO TABS
10.0000 mg | ORAL_TABLET | Freq: Three times a day (TID) | ORAL | 2 refills | Status: DC | PRN
  Filled 2021-06-02: qty 90, 30d supply, fill #0

## 2021-06-02 MED ORDER — ALLOPURINOL 100 MG PO TABS
200.0000 mg | ORAL_TABLET | Freq: Every day | ORAL | 3 refills | Status: DC
  Filled 2021-06-02: qty 60, 60d supply, fill #0
  Filled 2021-06-04: qty 120, 60d supply, fill #0

## 2021-06-02 MED ORDER — MELOXICAM 15 MG PO TABS
15.0000 mg | ORAL_TABLET | Freq: Every day | ORAL | 2 refills | Status: DC
  Filled 2021-06-02: qty 60, 60d supply, fill #0

## 2021-06-02 NOTE — Assessment & Plan Note (Signed)
Blood pressure normal at this visit ?

## 2021-06-02 NOTE — Patient Instructions (Signed)
Referral to orthopedic surgery and rheumatology will be made for your Gout and spine conditions  Take meloxicam one daily for gout and back pain  Take allopurinol $RemoveBeforeDE'200mg'[id number]$  daily for gout prevention  Take cyclobenzaprine three times daily as needed for muscle spasm  Stop indocin  Stop methacarbamol  Stop prednisone   Pick up colon cancer screen kit and will get labs today  Return Dr Joya Gaskins two months  Une rfrence  la chirurgie orthopdique et  la rhumatologie sera faite pour vos problmes de goutte et de colonne vertbrale  Prenez du mloxicam un par jour pour la goutte et les maux de dos  Prenez de l'allopurinol 200 mg par jour pour la prvention de la goutte  Prenez de la cyclobenzaprine trois fois par jour au besoin pour les spasmes musculaires  Arrter l'indocine  Arrtez le mthacarbamol  Arrter la prednisone  Procurez-vous un kit de dpistage du cancer du clon et obtiendrez des laboratoires aujourd'hui  Retour Dr Joya Gaskins deux mois

## 2021-06-02 NOTE — Assessment & Plan Note (Signed)
Previous and imaging shows disc bulge at lumbar 4 5 he may benefit from injection therapy I do not believe he needs surgery  We will refer this patient to orthopedic spine since he has Medicaid and will prescribe the meloxicam along with cyclobenzaprine as a muscle relaxant

## 2021-06-02 NOTE — Assessment & Plan Note (Signed)
Patient has a very hard time understanding his healthcare due to his language barrier despite using a video interpreter and in person interpreter would be better

## 2021-06-02 NOTE — Assessment & Plan Note (Signed)
Patient needs colon cancer screening will issue fecal occult kit

## 2021-06-02 NOTE — Assessment & Plan Note (Signed)
Evidence of tophi in both olecranon bursa both elbows with some decreased range of motion of both elbows  Previous uric acid levels extremely elevated and he is only getting 100 mg daily of allopurinol  Plan increase allopurinol to 200 mg daily and give a trial of meloxicam 15 mg daily discontinue Indocin and prednisone  Referral to rheumatology was made as he does have Medicaid

## 2021-06-03 LAB — COMPREHENSIVE METABOLIC PANEL
ALT: 39 IU/L (ref 0–44)
AST: 82 IU/L — ABNORMAL HIGH (ref 0–40)
Albumin/Globulin Ratio: 0.8 — ABNORMAL LOW (ref 1.2–2.2)
Albumin: 3.9 g/dL — ABNORMAL LOW (ref 4.0–5.0)
Alkaline Phosphatase: 198 IU/L — ABNORMAL HIGH (ref 44–121)
BUN/Creatinine Ratio: 20 (ref 9–20)
BUN: 14 mg/dL (ref 6–24)
Bilirubin Total: 0.4 mg/dL (ref 0.0–1.2)
CO2: 21 mmol/L (ref 20–29)
Calcium: 8.5 mg/dL — ABNORMAL LOW (ref 8.7–10.2)
Chloride: 108 mmol/L — ABNORMAL HIGH (ref 96–106)
Creatinine, Ser: 0.71 mg/dL — ABNORMAL LOW (ref 0.76–1.27)
Globulin, Total: 4.7 g/dL — ABNORMAL HIGH (ref 1.5–4.5)
Glucose: 71 mg/dL (ref 70–99)
Potassium: 4.6 mmol/L (ref 3.5–5.2)
Sodium: 147 mmol/L — ABNORMAL HIGH (ref 134–144)
Total Protein: 8.6 g/dL — ABNORMAL HIGH (ref 6.0–8.5)
eGFR: 112 mL/min/{1.73_m2} (ref >59)

## 2021-06-03 LAB — CBC WITH DIFFERENTIAL/PLATELET
Basophils Absolute: 0.1 10*3/uL (ref 0.0–0.2)
Basos: 1 %
EOS (ABSOLUTE): 0 10*3/uL (ref 0.0–0.4)
Eos: 1 %
Hematocrit: 32.8 % — ABNORMAL LOW (ref 37.5–51.0)
Hemoglobin: 10.1 g/dL — ABNORMAL LOW (ref 13.0–17.7)
Immature Grans (Abs): 0 10*3/uL (ref 0.0–0.1)
Immature Granulocytes: 0 %
Lymphocytes Absolute: 2.9 10*3/uL (ref 0.7–3.1)
Lymphs: 42 %
MCH: 26.6 pg (ref 26.6–33.0)
MCHC: 30.8 g/dL — ABNORMAL LOW (ref 31.5–35.7)
MCV: 87 fL (ref 79–97)
Monocytes Absolute: 0.4 10*3/uL (ref 0.1–0.9)
Monocytes: 6 %
Neutrophils Absolute: 3.4 10*3/uL (ref 1.4–7.0)
Neutrophils: 50 %
Platelets: 184 10*3/uL (ref 150–450)
RBC: 3.79 x10E6/uL — ABNORMAL LOW (ref 4.14–5.80)
RDW: 19.7 % — ABNORMAL HIGH (ref 11.6–15.4)
WBC: 6.9 10*3/uL (ref 3.4–10.8)

## 2021-06-03 LAB — URIC ACID: Uric Acid: 7.9 mg/dL (ref 3.8–8.4)

## 2021-06-04 ENCOUNTER — Other Ambulatory Visit: Payer: Self-pay

## 2021-06-04 ENCOUNTER — Telehealth: Payer: Self-pay

## 2021-06-04 NOTE — Telephone Encounter (Signed)
Pt was called and is aware of results, DOB was confirmed.  Interpreter ID# [phone]

## 2021-06-11 ENCOUNTER — Other Ambulatory Visit: Payer: Self-pay

## 2021-07-11 ENCOUNTER — Encounter: Payer: Self-pay | Admitting: *Deleted

## 2021-07-11 NOTE — Congregational Nurse Program (Signed)
?  Dept: [phone] ? ? ?Congregational Nurse Program Note ? ?Date of Encounter: 07/11/2021 ? ?Past Medical History: ?Past Medical History:  ?Diagnosis Date  ? Gout 12/30/2017  ? ? ?Encounter Details: ? CNP Questionnaire - 07/11/21 1426   ? ?  ? Questionnaire  ? Do you give verbal consent to treat you today? Yes   ? Location Patient Served  [facility]   ? Visit Setting Phone/Text/Email   ? Patient Status Immigrant   ? Insurance Medicaid   ? Insurance Referral N/A   ? Medication N/A   ? Medical Provider Yes   ? Screening Referrals N/A   ? Medical Referral N/A   [facility]  ? Medical Appointment Made N/A   ? Food Have Food Insecurities   ? Transportation Need transportation assistance   ? Housing/Utilities No permanent housing   ? Interpersonal Safety N/A   ? Intervention Advocate;Support;Navigate Healthcare System;Educate   ? ED Visit Averted Yes   ? Life-Saving Intervention Made N/A   ? ?  ?  ? ?  ? ?This CN received a call from client about future appointments.  Client says he got a call about an appointment in [place].  I did call both Dr. Delford Field and Dr. Barbaraann Faster offices to confirm that appointments in April are here in [place] for client.  I will text/call client to share information with him.  Will follow up as needed. ? ?Roderic Palau, RN, MSN, CNP ?[phone] Office ?[phone] Cell ? ? ? ?

## 2021-07-30 ENCOUNTER — Ambulatory Visit (INDEPENDENT_AMBULATORY_CARE_PROVIDER_SITE_OTHER): Payer: Medicaid Other | Admitting: Specialist

## 2021-07-30 ENCOUNTER — Ambulatory Visit: Payer: Self-pay

## 2021-07-30 ENCOUNTER — Encounter: Payer: Self-pay | Admitting: Specialist

## 2021-07-30 VITALS — BP 159/106 | HR 101 | Ht 67.0 in | Wt 159.0 lb

## 2021-07-30 DIAGNOSIS — M4646 Discitis, unspecified, lumbar region: Secondary | ICD-10-CM | POA: Diagnosis not present

## 2021-07-30 DIAGNOSIS — M545 Low back pain, unspecified: Secondary | ICD-10-CM | POA: Diagnosis not present

## 2021-07-30 DIAGNOSIS — M5136 Other intervertebral disc degeneration, lumbar region: Secondary | ICD-10-CM | POA: Diagnosis not present

## 2021-07-30 DIAGNOSIS — M48062 Spinal stenosis, lumbar region with neurogenic claudication: Secondary | ICD-10-CM | POA: Diagnosis not present

## 2021-07-30 MED ORDER — TRAMADOL HCL 50 MG PO TABS
50.0000 mg | ORAL_TABLET | Freq: Four times a day (QID) | ORAL | 0 refills | Status: DC | PRN
Start: 2021-07-30 — End: 2021-11-10

## 2021-07-30 NOTE — Progress Notes (Signed)
? ?Office Visit Note ?  ?Patient: Brandon Arias           ?Date of Birth: 08-Dec-1970           ?MRN: [id number] ?Visit Date: 07/30/2021 ?             ?Requested by: Storm Frisk, MD ?[address] ?[address] ?[place],  [place] [address] ?PCP: Storm Frisk, MD ? ? ?Assessment & Plan: ?Visit Diagnoses:  ?1. Low back pain, unspecified back pain laterality, unspecified chronicity, unspecified whether sciatica present   ?2. Other intervertebral disc degeneration, lumbar region   ?3. Spinal stenosis of lumbar region with neurogenic claudication   ? ? ?Plan: Avoid bending, stooping and avoid lifting weights greater than 10 lbs. ?Avoid prolong standing and walking. ?Order for a new walker with wheels. ?Surgery scheduling secretary Tivis Ringer, will call you in the next week to schedule for surgery.  ?Surgery recommended is a one level lumbar fusion L4-5 this would be done with rods, screws and cages with local bone graft and allograft (donor bone graft). ?Take hydrocodone for for pain. ?Risk of surgery includes risk of infection 1 in 200 patients, bleeding 1/2% chance you would need a transfusion.   Risk to the nerves is one in 10,000. ?You will need to use a brace for 3 months and wean from the brace on the 4th month. ?Expect improved walking and standing tolerance. Expect relief of leg pain but numbness ?may persist depending on the length and degree of pressure that has been present. ?Before surgery can be schedule you must be assessed for possible infection, return in 2 weeks to discuss surgery and results of the tests. ?Needs TB skin test. ? ?Follow-Up Instructions: No follow-ups on file.  ? ?Orders:  ?Orders Placed This Encounter  ?Procedures  ? XR Lumbar Spine 2-3 Views  ? ?No orders of the defined types were placed in this encounter. ? ? ? ? Procedures: ?No procedures performed ? ? ?Clinical Data: ?No additional findings. ? ? ?Subjective: ?Chief Complaint  ?Patient presents with  ? Lower Back - Pain   ? ? ?51 year old male with history of back pain, lumbosacral for the last one year. No injury, no fall began one day painful then he went to work and return and sat and had in ability to get up. Pain post going to the bathroom at first. No bowel or bladder difficulty. He has pain in the small of the back with bending and twisting and difficulty bending to reach the floor. He is unable to work since the pain began. 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  ? ? ?Review of Systems  ?Constitutional: Negative.   ?HENT: Negative.    ?Eyes: Negative.   ?Respiratory: Negative.    ?Cardiovascular: Negative.   ?Gastrointestinal: Negative.   ?Endocrine: Negative.   ?Genitourinary: Negative.   ?Musculoskeletal: Negative.   ?Skin: Negative.   ?Allergic/Immunologic: Negative.   ?Neurological: Negative.   ?Hematological: Negative.   ?Psychiatric/Behavioral: Negative.    ? ? ?Objective: ?Vital Signs: BP (!) 159/106 (BP Location: Left Arm, Patient Position: Sitting)   Pulse (!) 101   Ht 5\' 7"  (1.702 m)   Wt 159 lb (72.1 kg)   BMI 24.90 kg/m?  ? ?Physical Exam ?Constitutional:   ?   Appearance: He is well-developed.  ?HENT:  ?   Head: Normocephalic and atraumatic.  ?Eyes:  ?   Pupils: Pupils are equal, round, and  reactive to light.  ?Pulmonary:  ?   Effort: Pulmonary effort is normal.  ?   Breath sounds: Normal breath sounds.  ?Abdominal:  ?   General: Bowel sounds are normal.  ?   Palpations: Abdomen is soft.  ?Musculoskeletal:     ?   General: Normal range of motion.  ?   Cervical back: Normal range of motion and neck supple.  ?Skin: ?   General: Skin is warm and dry.  ?Neurological:  ?   Mental Status: He is alert and oriented to person, place, and time.  ?Psychiatric:     ?   Behavior: Behavior normal.     ?   Thought Content: Thought content normal.     ?   Judgment: Judgment normal.  ? ? ?Ortho Exam ? ?Specialty Comments:  ?No specialty comments available. ? ?Imaging: ?No results  found. ? ? ?PMFS History: ?Patient Active Problem List  ? Diagnosis Date Noted  ? Lumbar radiculopathy 06/02/2021  ? Gout, tophaceous 06/02/2021  ? Colon cancer screening 06/02/2021  ? Language barrier affecting health care 06/02/2021  ? Elevated blood pressure reading in office without diagnosis of hypertension 12/31/2020  ? ?Past Medical History:  ?Diagnosis Date  ? Gout 12/30/2017  ?  ?No family history on file.  ?Past Surgical History:  ?Procedure Laterality Date  ? no known surgeries    ? ?Social History  ? ?Occupational History  ? Not on file  ?Tobacco Use  ? Smoking status: Never  ? Smokeless tobacco: Never  ?Substance and Sexual Activity  ? Alcohol use: Yes  ?  Comment: occ  ? Drug use: Never  ? Sexual activity: Not Currently  ? ? ? ? ? ? ?

## 2021-07-30 NOTE — Patient Instructions (Signed)
Avoid bending, stooping and avoid lifting weights greater than 10 lbs. ?Avoid prolong standing and walking. ?Order for a new walker with wheels. ?Surgery scheduling secretary Tivis Ringer, will call you in the next week to schedule for surgery.  ?Surgery recommended is a one level lumbar fusion L4-5 this would be done with rods, screws and cages with local bone graft and allograft (donor bone graft). ?Take hydrocodone for for pain. ?Risk of surgery includes risk of infection 1 in 200 patients, bleeding 1/2% chance you would need a transfusion.   Risk to the nerves is one in 10,000. ?You will need to use a brace for 3 months and wean from the brace on the 4th month. ?Expect improved walking and standing tolerance. Expect relief of leg pain but numbness ?may persist depending on the length and degree of pressure that has been present. ?Before surgery can be schedule you must be assessed for possible infection, return in 2 weeks to discuss surgery and results of the tests. ?Needs TB skin test. ?

## 2021-07-31 LAB — SEDIMENTATION RATE: Sed Rate: 41 mm/h — ABNORMAL HIGH (ref 0–20)

## 2021-07-31 LAB — C-REACTIVE PROTEIN: CRP: 1.4 mg/L (ref <8.0)

## 2021-08-12 ENCOUNTER — Encounter: Payer: Self-pay | Admitting: *Deleted

## 2021-08-13 ENCOUNTER — Ambulatory Visit: Payer: Medicaid Other | Admitting: Specialist

## 2021-08-13 NOTE — Congregational Nurse Program (Signed)
?  Dept: [phone] ? ? ?Congregational Nurse Program Note ? ?Date of Encounter: 08/12/2021 ? ?Past Medical History: ?Past Medical History:  ?Diagnosis Date  ? Gout 12/30/2017  ? ? ?Encounter Details: ? CNP Questionnaire - 08/12/21 1230   ? ?  ? Questionnaire  ? Do you give verbal consent to treat you today? Yes   ? Location Patient [place]   ? Visit Setting Home   ? Patient Status Immigrant   ? Insurance Medicaid   ? Insurance Referral N/A   ? Medication N/A   ? Medical Provider Yes   ? Screening Referrals N/A   ? Medical Referral N/A   [facility]  ? Medical Appointment Made N/A   ? Food Have Food Insecurities   ? Transportation Provided transportation assistance   ? Housing/Utilities No permanent housing   ? Interpersonal Safety N/A   ? Intervention Advocate;Support;Navigate Healthcare System;Educate   ? ED Visit Averted Yes   ? Life-Saving Intervention Made N/A   ? ?  ?  ? ?  ? ?Met with client at his home (he is staying with his sister).  Gave client taxi vouchers and called to schedule taxi ride for upcoming appointment at [facility] on Thursday April 20.  Discussed health concerns with client.  Accompanied client to update monthly bus pass and to shop for items at the [employer] store.  Client continues with some back and leg pain but is otherwise doing okay today.  Client will follow up with this CN as needed. ? ?Karene Fry, RN, MSN, CNP ?[phone] Office ?[phone] Cell ? ? ? ? ? ? ?

## 2021-08-14 ENCOUNTER — Ambulatory Visit: Payer: Medicaid Other | Attending: Critical Care Medicine | Admitting: Critical Care Medicine

## 2021-08-14 ENCOUNTER — Other Ambulatory Visit: Payer: Self-pay

## 2021-08-14 ENCOUNTER — Encounter: Payer: Self-pay | Admitting: Critical Care Medicine

## 2021-08-14 VITALS — BP 130/85 | HR 106 | Resp 16 | Wt 168.0 lb

## 2021-08-14 DIAGNOSIS — R030 Elevated blood-pressure reading, without diagnosis of hypertension: Secondary | ICD-10-CM

## 2021-08-14 DIAGNOSIS — R945 Abnormal results of liver function studies: Secondary | ICD-10-CM

## 2021-08-14 DIAGNOSIS — Z8739 Personal history of other diseases of the musculoskeletal system and connective tissue: Secondary | ICD-10-CM | POA: Diagnosis not present

## 2021-08-14 DIAGNOSIS — M5416 Radiculopathy, lumbar region: Secondary | ICD-10-CM

## 2021-08-14 DIAGNOSIS — Z111 Encounter for screening for respiratory tuberculosis: Secondary | ICD-10-CM

## 2021-08-14 DIAGNOSIS — Z1211 Encounter for screening for malignant neoplasm of colon: Secondary | ICD-10-CM | POA: Diagnosis not present

## 2021-08-14 DIAGNOSIS — M1A9XX1 Chronic gout, unspecified, with tophus (tophi): Secondary | ICD-10-CM | POA: Diagnosis not present

## 2021-08-14 DIAGNOSIS — R7989 Other specified abnormal findings of blood chemistry: Secondary | ICD-10-CM

## 2021-08-14 MED ORDER — ALLOPURINOL 100 MG PO TABS
200.0000 mg | ORAL_TABLET | Freq: Every day | ORAL | 3 refills | Status: DC
  Filled 2021-08-14: qty 120, 60d supply, fill #0

## 2021-08-14 NOTE — Assessment & Plan Note (Signed)
Blood pressure initially high but on recheck is normal we will leave off medications for now ?

## 2021-08-14 NOTE — Assessment & Plan Note (Addendum)
Patient follow-up with Dr. Otelia Sergeant for planned surgery ?

## 2021-08-14 NOTE — Progress Notes (Signed)
? ?Established Patient Office Visit ? ?Subjective:  ?Patient ID: Brandon Arias, male    DOB: [birth year]  Age: 51 y.o. MRN: [id number] ? ?CC:  ?Chief Complaint  ?Patient presents with  ? Elbow Pain  ? ? ?HPI ? ?05/2021 ?Brandon Fishermandouard Sumners presents for evaluation of right lower back pain which the patient subscribes to being his kidney.  This visit was assisted with [place] interpreter Renae Fickleaul [phone] on video ? ?Patient originally is from [place] and was brought to the clinic under the guidance of the [facility].  The patient has had 3 COVID vaccinations on file.  Patient has a history of tophaceous gout involving both elbows for which she has had multiple encounters in urgent care emergency room and other providers.  The patient is due colon cancer screening.  He does now have Medicaid. ? ?Patient does drink alcohol with various parties and birthday events on weekends.  He has been in the emergency room for this previously with elevated liver function tests.  He now complains of difficulty with balance and carrying objects he is not able to work.  He has severe pain in the lower back which results in difficulty standing and sitting.  He also has gout flares in the elbows and states the indomethacin has been given and the low-dose allopurinol has not been of any benefit.  He does have a very high uric acid level but no renal insufficiency.  He also is requesting some help in his home environment.  He has been on prednisone Indocin and Allender Purinol along with methocarbamol without much relief at this time. ? ?Patient has no other complaints. ? 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  The patient has since last being seen here has been evaluated by spinal surgeon who is planning spinal surgery in the next 2 months.  He is yet to get him to rheumatology because of his tophaceous gout ?Patient still has elbow pain.  He also did not pick up his allopurinol.  He  has been using the meloxicam.  On arrival blood pressure is good 130/85.  He has been using meloxicam and the Flexeril. ?The patient has stopped drinking alcohol.  He is less anxious at this visit compared to prior visits. ?Past Medical History:  ?Diagnosis Date  ? Gout 12/30/2017  ? ? ?Past Surgical History:  ?Procedure Laterality Date  ? no known surgeries    ? ? ?History reviewed. No pertinent family history. ? ?Social History  ? ?Socioeconomic History  ? Marital status: Married  ?  Spouse name: Not on file  ? Number of children: Not on file  ? Years of education: Not on file  ? Highest education level: Not on file  ?Occupational History  ? Not on file  ?Tobacco Use  ? Smoking status: Never  ? Smokeless tobacco: Never  ?Substance and Sexual Activity  ? Alcohol use: Yes  ?  Comment: occ  ? Drug use: Never  ? Sexual activity: Not Currently  ?Other Topics Concern  ? Not on file  ?Social History Narrative  ? Not on file  ? ?Social Determinants of Health  ? ?Financial Resource Strain: Not on file  ?Food Insecurity: Not on file  ?Transportation Needs: Not on file  ?Physical Activity: Not on file  ?Stress: Not on file  ?Social Connections: Not on file  ?Intimate Partner Violence: Not on file  ? ? ?Outpatient Medications Prior to Visit  ?Medication Sig Dispense Refill  ? cyclobenzaprine (  FLEXERIL) 10 MG tablet Take 1 tablet (10 mg total) by mouth 3 (three) times daily as needed for muscle spasms. 90 tablet 2  ? meloxicam (MOBIC) 15 MG tablet Take 1 tablet (15 mg total) by mouth daily. 60 tablet 2  ? traMADol (ULTRAM) 50 MG tablet Take 1 tablet (50 mg total) by mouth every 6 (six) hours as needed. 30 tablet 0  ? allopurinol (ZYLOPRIM) 100 MG tablet Take 2 tablets (200 mg total) by mouth daily. 120 tablet 3  ? ?No facility-administered medications prior to visit.  ? ? ?No Known Allergies ? ?ROS ?Review of Systems  ?Constitutional:  Negative for chills, diaphoresis and fever.  ?HENT:  Negative for congestion, hearing loss,  nosebleeds, sore throat and tinnitus.   ?Eyes:  Negative for photophobia and redness.  ?Respiratory:  Negative for cough, shortness of breath, wheezing and stridor.   ?Cardiovascular:  Negative for chest pain, palpitations and leg swelling.  ?Gastrointestinal:  Negative for abdominal pain, blood in stool, constipation, diarrhea, nausea and vomiting.  ?Endocrine: Negative for polydipsia.  ?Genitourinary:  Positive for flank pain. Negative for dysuria, frequency, hematuria and urgency.  ?Musculoskeletal:  Positive for arthralgias, back pain, gait problem and joint swelling. Negative for myalgias and neck pain.  ?Skin:  Negative for rash.  ?Allergic/Immunologic: Negative for environmental allergies.  ?Neurological:  Negative for dizziness, tremors, seizures, weakness and headaches.  ?Hematological:  Does not bruise/bleed easily.  ?Psychiatric/Behavioral:  Negative for dysphoric mood and suicidal ideas. The patient is not nervous/anxious.   ? ?  ?Objective:  ?  ?Physical Exam ?Vitals reviewed.  ?Constitutional:   ?   General: He is not in acute distress. ?   Appearance: Normal appearance. He is well-developed. He is not ill-appearing, toxic-appearing or diaphoretic.  ?   Comments: Significant push of speech somewhat angry at times restless  ?HENT:  ?   Head: Normocephalic and atraumatic.  ?   Nose: Nose normal. No nasal deformity, septal deviation, mucosal edema or rhinorrhea.  ?   Right Sinus: No maxillary sinus tenderness or frontal sinus tenderness.  ?   Left Sinus: No maxillary sinus tenderness or frontal sinus tenderness.  ?   Mouth/Throat:  ?   Mouth: Mucous membranes are moist.  ?   Pharynx: Oropharynx is clear. No oropharyngeal exudate.  ?   Comments: Mild dental caries ?Eyes:  ?   General: No scleral icterus. ?   Conjunctiva/sclera: Conjunctivae normal.  ?   Pupils: Pupils are equal, round, and reactive to light.  ?Neck:  ?   Thyroid: No thyromegaly.  ?   Vascular: No carotid bruit or JVD.  ?   Trachea: Trachea  normal. No tracheal tenderness or tracheal deviation.  ?Cardiovascular:  ?   Rate and Rhythm: Normal rate and regular rhythm.  ?   Chest Wall: PMI is not displaced.  ?   Pulses: Normal pulses. No decreased pulses.  ?   Heart sounds: Normal heart sounds, S1 normal and S2 normal. Heart sounds not distant. No murmur heard. ?No systolic murmur is present.  ?No diastolic murmur is present.  ?  No friction rub. No gallop. No S3 or S4 sounds.  ?Pulmonary:  ?   Effort: No tachypnea, accessory muscle usage or respiratory distress.  ?   Breath sounds: No stridor. No decreased breath sounds, wheezing, rhonchi or rales.  ?Chest:  ?   Chest wall: No tenderness.  ?Abdominal:  ?   General: Bowel sounds are normal. There is no distension.  ?  Palpations: Abdomen is soft. Abdomen is not rigid.  ?   Tenderness: There is no abdominal tenderness. There is no guarding or rebound.  ?Musculoskeletal:     ?   General: Tenderness present. No swelling or deformity. Normal range of motion.  ?   Cervical back: Normal range of motion and neck supple. No edema, erythema or rigidity. No muscular tenderness. Normal range of motion.  ?   Right lower leg: No edema.  ?   Left lower leg: No edema.  ?   Comments: Point tenderness in the left lower back with reproducible pain.  The patient is neurologically intact in the lower extremities.  ?Lymphadenopathy:  ?   Head:  ?   Right side of head: No submental or submandibular adenopathy.  ?   Left side of head: No submental or submandibular adenopathy.  ?   Cervical: No cervical adenopathy.  ?Skin: ?   General: Skin is warm and dry.  ?   Coloration: Skin is not pale.  ?   Findings: No rash.  ?   Nails: There is no clubbing.  ?Neurological:  ?   General: No focal deficit present.  ?   Mental Status: He is alert and oriented to person, place, and time. Mental status is at baseline.  ?   Cranial Nerves: No cranial nerve deficit.  ?   Motor: No weakness.  ?   Coordination: Coordination normal.  ?   Gait: Gait  normal.  ?   Deep Tendon Reflexes: Reflexes normal.  ?   Comments: Patient has extreme difficulty arising from a sitting position in the chair.  ?Psychiatric:     ?   Mood and Affect: Mood normal.

## 2021-08-14 NOTE — Assessment & Plan Note (Signed)
This is due to alcohol use we will reassess he is no longer drinking ?

## 2021-08-14 NOTE — Patient Instructions (Addendum)
Refills on allopurinol sent to our pharmacy ? ?Referral back to rheumatology was made ? ?Follow-up with Dr. Merrilee Seashore on your surgery ? ?Obtain colon cancer screening kit and will obtain liver function test and TB's test at lab today ? ?Return Dr Joya Gaskins 2 months ? ?Recharges d'allopurinol envoy?es ? notre pharmacie ? ?Le renvoi en rhumatologie a ?t? effectu? ? ?[facility] Merrilee Seashore sur votre chirurgie ? ?Fae Pippin un kit de d?pistage du cancer du c?lon et obtiendrez un test de la fonction h?patique et un test de la tuberculose au laboratoire aujourd'hui ? ?Retour Dr Joya Gaskins 2 mois ? ?

## 2021-08-14 NOTE — Assessment & Plan Note (Signed)
Never prostate cancer screening kit will review issue same ?

## 2021-08-14 NOTE — Assessment & Plan Note (Signed)
The patient never got his appointment with rheumatology will refer him again and represcribed the allopurinol ?

## 2021-08-18 ENCOUNTER — Telehealth: Payer: Self-pay

## 2021-08-18 NOTE — Telephone Encounter (Signed)
Pt was called and is aware of results, DOB was confirmed.  ? ?Interpreter ID# ?

## 2021-08-18 NOTE — Telephone Encounter (Signed)
-----   Message from Storm Frisk, MD sent at 08/15/2021  3:13 PM EDT ----- ?Let pt know liver function still high needs to avoid all alcohol products ?

## 2021-08-19 NOTE — Telephone Encounter (Signed)
Let pt know this is due to his lower back disc disease,  he must keep follow up appts with Dr Otelia Sergeant  ?

## 2021-08-19 NOTE — Telephone Encounter (Signed)
Pt is aware of note and also wanted a appt, it was scheduled  ? ?Interpreter ID#Angela [phone] ?

## 2021-08-20 LAB — HEPATIC FUNCTION PANEL
ALT: 62 IU/L — ABNORMAL HIGH (ref 0–44)
AST: 166 IU/L — ABNORMAL HIGH (ref 0–40)
Albumin: 3.5 g/dL — ABNORMAL LOW (ref 3.8–4.9)
Alkaline Phosphatase: 184 IU/L — ABNORMAL HIGH (ref 44–121)
Bilirubin Total: 0.8 mg/dL (ref 0.0–1.2)
Bilirubin, Direct: 0.34 mg/dL (ref 0.00–0.40)
Total Protein: 8.4 g/dL (ref 6.0–8.5)

## 2021-08-20 LAB — QUANTIFERON-TB GOLD PLUS
QuantiFERON Mitogen Value: 7.52 IU/mL
QuantiFERON Nil Value: 0.06 IU/mL
QuantiFERON TB1 Ag Value: 0.08 IU/mL
QuantiFERON TB2 Ag Value: 0.12 IU/mL
QuantiFERON-TB Gold Plus: NEGATIVE

## 2021-08-21 ENCOUNTER — Telehealth: Payer: Self-pay

## 2021-08-21 NOTE — Telephone Encounter (Signed)
-----   Message from Storm Frisk, MD sent at 08/20/2021  5:44 AM EDT ----- ?Make pt aware TB test was negative   was asked to obtain by orthopedics I will forward to dr Otelia Sergeant ?

## 2021-08-21 NOTE — Telephone Encounter (Signed)
Pt was called and no vm was left due to mailbox being full. Information has been sent to nurse pool.  ? ?Interpreter VQ#[phone] ? ?

## 2021-09-09 ENCOUNTER — Telehealth: Payer: Self-pay

## 2021-09-09 NOTE — Telephone Encounter (Signed)
Copied from CRM [id number]. Topic: General - Other ?>> Sep 09, 2021 12:35 PM Marylen Ponto wrote: ?Reason for CRM: Pamelia [facility] requests call back because patient told her that the appt scheduled for 09/15/21 was rescheduled for 10/16/21. Jeanie requests call back to advise because she would have to arrange transportation. Cb# [phone] ?

## 2021-09-10 NOTE — Telephone Encounter (Signed)
Called and talked with [name] appt was fixed. Pt will be coming in for 10/16/2021 appt  ?

## 2021-09-15 ENCOUNTER — Ambulatory Visit: Payer: Medicaid Other | Admitting: Critical Care Medicine

## 2021-10-16 ENCOUNTER — Ambulatory Visit: Payer: Medicaid Other | Admitting: Critical Care Medicine

## 2021-11-09 NOTE — Progress Notes (Signed)
Established Patient Office Visit  Subjective:  Patient ID: Brandon Arias, male    DOB: April 15, 1971  Age: 51 y.o. MRN: [id number]  CC:  Primary care follow-up  HPI  05/2021 Brandon Arias presents for evaluation of right lower back pain which the patient subscribes to being his kidney.  This visit was assisted with [place] interpreter Eddie Dibbles [phone] on video  Patient originally is from [place] and was brought to the clinic under the guidance of the [facility].  The patient has had 3 COVID vaccinations on file.  Patient has a history of tophaceous gout involving both elbows for which she has had multiple encounters in urgent care emergency room and other providers.  The patient is due colon cancer screening.  He does now have Medicaid.  Patient does drink alcohol with various parties and birthday events on weekends.  He has been in the emergency room for this previously with elevated liver function tests.  He now complains of difficulty with balance and carrying objects he is not able to work.  He has severe pain in the lower back which results in difficulty standing and sitting.  He also has gout flares in the elbows and states the indomethacin has been given and the low-dose allopurinol has not been of any benefit.  He does have a very high uric acid level but no renal insufficiency.  He also is requesting some help in his home environment.  He has been on prednisone Indocin and Allender Purinol along with methocarbamol without much relief at this time.  Patient has no other complaints.  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  The patient has since last being seen here has been evaluated by spinal surgeon who is planning spinal surgery in the next 2 months.  He is yet to get him to rheumatology because of his tophaceous gout Patient still has elbow pain.  He also did not pick up his allopurinol.  He has been using the meloxicam.  On  arrival blood pressure is good 130/85.  He has been using meloxicam and the Flexeril. The patient has stopped drinking alcohol.  He is less anxious at this visit compared to prior visits.  7/17 This patient is seen in return visit and visit was assisted with video [place] interpreter Christella [phone]  On arrival blood pressure elevated 150/90.  Patient still has chronic back pain was supposed of had surgery but has been postponed till November.  Patient was positive seeing rheumatology for his tophaceous gout however he did not respond when attempts to make appointment was made.  I will need to get his nurse case manager to assist in this in the [place] nurse program.  Patient needs a letter stating he cannot work due to his back pain.  We did do a TB screen at the last visit it was negative.    Past Medical History:  Diagnosis Date   Gout 12/30/2017    Past Surgical History:  Procedure Laterality Date   no known surgeries      History reviewed. No pertinent family history.  Social History   Socioeconomic History   Marital status: Married    Spouse name: Not on file   Number of children: Not on file   Years of education: Not on file   Highest education level: Not on file  Occupational History   Not on file  Tobacco Use   Smoking status: Never   Smokeless tobacco: Never  Substance and  Sexual Activity   Alcohol use: Yes    Comment: occ   Drug use: Never   Sexual activity: Not Currently  Other Topics Concern   Not on file  Social History Narrative   Not on file   Social Determinants of Health   Financial Resource Strain: Not on file  Food Insecurity: Not on file  Transportation Needs: Not on file  Physical Activity: Not on file  Stress: Not on file  Social Connections: Not on file  Intimate Partner Violence: Not on file    Outpatient Medications Prior to Visit  Medication Sig Dispense Refill   allopurinol (ZYLOPRIM) 100 MG tablet Take 2 tablets (200 mg  total) by mouth daily. 120 tablet 3   cyclobenzaprine (FLEXERIL) 10 MG tablet Take 1 tablet (10 mg total) by mouth 3 (three) times daily as needed for muscle spasms. 90 tablet 2   meloxicam (MOBIC) 15 MG tablet Take 1 tablet (15 mg total) by mouth daily. 60 tablet 2   traMADol (ULTRAM) 50 MG tablet Take 1 tablet (50 mg total) by mouth every 6 (six) hours as needed. 30 tablet 0   No facility-administered medications prior to visit.    No Known Allergies  ROS Review of Systems  Constitutional:  Negative for chills, diaphoresis and fever.  HENT:  Negative for congestion, hearing loss, nosebleeds, sore throat and tinnitus.   Eyes:  Negative for photophobia and redness.  Respiratory:  Negative for cough, shortness of breath, wheezing and stridor.   Cardiovascular:  Negative for chest pain, palpitations and leg swelling.  Gastrointestinal:  Negative for abdominal pain, blood in stool, constipation, diarrhea, nausea and vomiting.  Endocrine: Negative for polydipsia.  Genitourinary:  Negative for dysuria, flank pain, frequency, hematuria and urgency.  Musculoskeletal:  Positive for arthralgias, back pain, gait problem and joint swelling. Negative for myalgias and neck pain.  Skin:  Negative for rash.  Allergic/Immunologic: Negative for environmental allergies.  Neurological:  Negative for dizziness, tremors, seizures, weakness and headaches.  Hematological:  Does not bruise/bleed easily.  Psychiatric/Behavioral:  Negative for dysphoric mood and suicidal ideas. The patient is not nervous/anxious.       Objective:    Physical Exam Vitals reviewed.  Constitutional:      General: He is not in acute distress.    Appearance: Normal appearance. He is well-developed. He is not ill-appearing, toxic-appearing or diaphoretic.     Comments: Significant push of speech somewhat angry at times restless  HENT:     Head: Normocephalic and atraumatic.     Nose: Nose normal. No nasal deformity, septal  deviation, mucosal edema or rhinorrhea.     Right Sinus: No maxillary sinus tenderness or frontal sinus tenderness.     Left Sinus: No maxillary sinus tenderness or frontal sinus tenderness.     Mouth/Throat:     Mouth: Mucous membranes are moist.     Pharynx: Oropharynx is clear. No oropharyngeal exudate.     Comments: Mild dental caries Eyes:     General: No scleral icterus.    Conjunctiva/sclera: Conjunctivae normal.     Pupils: Pupils are equal, round, and reactive to light.  Neck:     Thyroid: No thyromegaly.     Vascular: No carotid bruit or JVD.     Trachea: Trachea normal. No tracheal tenderness or tracheal deviation.  Cardiovascular:     Rate and Rhythm: Normal rate and regular rhythm.     Chest Wall: PMI is not displaced.     Pulses: Normal  pulses. No decreased pulses.     Heart sounds: Normal heart sounds, S1 normal and S2 normal. Heart sounds not distant. No murmur heard.    No systolic murmur is present.     No diastolic murmur is present.     No friction rub. No gallop. No S3 or S4 sounds.  Pulmonary:     Effort: No tachypnea, accessory muscle usage or respiratory distress.     Breath sounds: No stridor. No decreased breath sounds, wheezing, rhonchi or rales.  Chest:     Chest wall: No tenderness.  Abdominal:     General: Bowel sounds are normal. There is no distension.     Palpations: Abdomen is soft. Abdomen is not rigid.     Tenderness: There is no abdominal tenderness. There is no guarding or rebound.  Musculoskeletal:        General: Tenderness present. No swelling or deformity. Normal range of motion.     Cervical back: Normal range of motion and neck supple. No edema, erythema or rigidity. No muscular tenderness. Normal range of motion.     Right lower leg: No edema.     Left lower leg: No edema.     Comments: Point tenderness in the left lower back with reproducible pain.  The patient is neurologically intact in the lower extremities.  Lymphadenopathy:      Head:     Right side of head: No submental or submandibular adenopathy.     Left side of head: No submental or submandibular adenopathy.     Cervical: No cervical adenopathy.  Skin:    General: Skin is warm and dry.     Coloration: Skin is not pale.     Findings: No rash.     Nails: There is no clubbing.  Neurological:     General: No focal deficit present.     Mental Status: He is alert and oriented to person, place, and time. Mental status is at baseline.     Cranial Nerves: No cranial nerve deficit.     Motor: No weakness.     Coordination: Coordination normal.     Gait: Gait normal.     Deep Tendon Reflexes: Reflexes normal.     Comments: Patient has extreme difficulty arising from a sitting position in the chair.  Psychiatric:        Mood and Affect: Mood normal.        Speech: Speech normal.        Behavior: Behavior normal.        Thought Content: Thought content normal.        Judgment: Judgment normal.     BP (!) 150/90   Pulse 85   Wt 167 lb 12.8 oz (76.1 kg)   SpO2 98%   BMI 26.28 kg/m  Wt Readings from Last 3 Encounters:  11/10/21 167 lb 12.8 oz (76.1 kg)  08/14/21 168 lb (76.2 kg)  07/30/21 159 lb (72.1 kg)     Health Maintenance Due  Topic Date Due   COVID-19 Vaccine (1) Never done   COLON CANCER SCREENING ANNUAL FOBT  Never done    There are no preventive care reminders to display for this patient.  Lab Results  Component Value Date   TSH 0.714 12/31/2020   Lab Results  Component Value Date   WBC 6.9 06/02/2021   HGB 10.1 (L) 06/02/2021   HCT 32.8 (L) 06/02/2021   MCV 87 06/02/2021   PLT 184 06/02/2021   Lab Results  Component Value Date   NA 147 (H) 06/02/2021   K 4.6 06/02/2021   CO2 21 06/02/2021   GLUCOSE 71 06/02/2021   BUN 14 06/02/2021   CREATININE 0.71 (L) 06/02/2021   BILITOT 0.8 08/14/2021   ALKPHOS 184 (H) 08/14/2021   AST 166 (H) 08/14/2021   ALT 62 (H) 08/14/2021   PROT 8.4 08/14/2021   ALBUMIN 3.5 (L) 08/14/2021    CALCIUM 8.5 (L) 06/02/2021   ANIONGAP 8 01/31/2021   EGFR 112 06/02/2021   Lab Results  Component Value Date   CHOL 78 (L) 12/31/2020   Lab Results  Component Value Date   HDL 30 (L) 12/31/2020   Lab Results  Component Value Date   LDLCALC 34 12/31/2020   Lab Results  Component Value Date   TRIG 58 12/31/2020   Lab Results  Component Value Date   CHOLHDL 2.6 12/31/2020   No results found for: "HGBA1C" 01/2021 MRI Lumbar: IMPRESSION: 1. At L4-5 there is a broad-based disc bulge. Mild bilateral facet arthropathy. Severe spinal stenosis. Mild bilateral foraminal stenosis. Bilateral subarticular recess stenosis. 2.  No acute osseous injury of the lumbar spine.   Assessment & Plan:   Problem List Items Addressed This Visit       Cardiovascular and Mediastinum   Essential hypertension - Primary    Has hypertension as a diagnosis plan is to begin amlodipine 5 mg daily      Relevant Medications   amLODipine (NORVASC) 5 MG tablet     Musculoskeletal and Integument   Gout, tophaceous    I have refilled the allopurinol and will make yet another referral to rheumatology      Relevant Medications   allopurinol (ZYLOPRIM) 100 MG tablet   cyclobenzaprine (FLEXERIL) 10 MG tablet   traMADol (ULTRAM) 50 MG tablet     Other   Language barrier affecting health care    The patient speaks some [place] but is not fully conversant with [place] and this is the only language we are using with him  He is very difficult to communicate with this is a huge barrier to his health.  When the office is calling for appointments they leave English messages and this is a barrier      Elevated liver function tests    History of some alcohol use did had elevations in liver functions t      Other Visit Diagnoses     History of gout       Relevant Medications   allopurinol (ZYLOPRIM) 100 MG tablet      Meds ordered this encounter  Medications   allopurinol (ZYLOPRIM) 100 MG tablet     Sig: Take 2 tablets (200 mg total) by mouth daily.    Dispense:  120 tablet    Refill:  3    Dose change**INS does not cover $RemoveB'200mg'txdIHKfE$  tabs. changed to $RemoveBe'100mg'ETMZJKLpI$  2 tabs qd  Never picked up med   cyclobenzaprine (FLEXERIL) 10 MG tablet    Sig: Take 1 tablet (10 mg total) by mouth 3 (three) times daily as needed for muscle spasms.    Dispense:  90 tablet    Refill:  2   traMADol (ULTRAM) 50 MG tablet    Sig: Take 1 tablet (50 mg total) by mouth every 6 (six) hours as needed.    Dispense:  30 tablet    Refill:  0   amLODipine (NORVASC) 5 MG tablet    Sig: Take 1 tablet (5 mg total) by mouth  daily.    Dispense:  90 tablet    Refill:  1   Follow-up: Return in about 4 months (around 03/13/2022).    Asencion Noble, MD

## 2021-11-10 ENCOUNTER — Other Ambulatory Visit: Payer: Self-pay

## 2021-11-10 ENCOUNTER — Ambulatory Visit: Payer: Medicaid Other | Admitting: Critical Care Medicine

## 2021-11-10 ENCOUNTER — Encounter: Payer: Self-pay | Admitting: Critical Care Medicine

## 2021-11-10 ENCOUNTER — Ambulatory Visit: Payer: Medicaid Other | Attending: Critical Care Medicine | Admitting: Critical Care Medicine

## 2021-11-10 VITALS — BP 150/90 | HR 85 | Wt 167.8 lb

## 2021-11-10 DIAGNOSIS — R7989 Other specified abnormal findings of blood chemistry: Secondary | ICD-10-CM

## 2021-11-10 DIAGNOSIS — Z8739 Personal history of other diseases of the musculoskeletal system and connective tissue: Secondary | ICD-10-CM | POA: Diagnosis not present

## 2021-11-10 DIAGNOSIS — M1A9XX1 Chronic gout, unspecified, with tophus (tophi): Secondary | ICD-10-CM | POA: Diagnosis not present

## 2021-11-10 DIAGNOSIS — Z789 Other specified health status: Secondary | ICD-10-CM | POA: Diagnosis not present

## 2021-11-10 DIAGNOSIS — I10 Essential (primary) hypertension: Secondary | ICD-10-CM | POA: Diagnosis not present

## 2021-11-10 MED ORDER — TRAMADOL HCL 50 MG PO TABS
50.0000 mg | ORAL_TABLET | Freq: Four times a day (QID) | ORAL | 0 refills | Status: DC | PRN
  Filled 2021-11-10: qty 20, 5d supply, fill #0

## 2021-11-10 MED ORDER — CYCLOBENZAPRINE HCL 10 MG PO TABS
10.0000 mg | ORAL_TABLET | Freq: Three times a day (TID) | ORAL | 2 refills | Status: DC | PRN
  Filled 2021-11-10: qty 90, 30d supply, fill #0

## 2021-11-10 MED ORDER — AMLODIPINE BESYLATE 5 MG PO TABS
5.0000 mg | ORAL_TABLET | Freq: Every day | ORAL | 1 refills | Status: DC
  Filled 2021-11-10: qty 90, 90d supply, fill #0

## 2021-11-10 MED ORDER — ALLOPURINOL 100 MG PO TABS
200.0000 mg | ORAL_TABLET | Freq: Every day | ORAL | 3 refills | Status: DC
  Filled 2021-11-10: qty 120, 60d supply, fill #0

## 2021-11-10 NOTE — Assessment & Plan Note (Signed)
The patient speaks some [place] but is not fully conversant with [place] and this is the only language we are using with him  He is very difficult to communicate with this is a huge barrier to his health.  When the office is calling for appointments they leave English messages and this is a barrier

## 2021-11-10 NOTE — Assessment & Plan Note (Signed)
I have refilled the allopurinol and will make yet another referral to rheumatology

## 2021-11-10 NOTE — Patient Instructions (Addendum)
A referral to rheumatology was made for an appointment with Dr Jon Billings at [phone]   Dr Delford Field to let Brandon Arias know  Refills on medications sent to our pharmacy  A letter stating you cannot work was produced  Begin amlodipine 1 pill daily for blood pressure  Return 4 months  Une rfrence  la rhumatologie a t faite pour [employer] Dr Jon Billings au [phone] Dr Delford Field pour informer Brandon Arias  [employer] les mdicaments envoys  notre pharmacie  Une lettre indiquant que vous ne pouvez pas travailler a t produite  Commencer l'amlodipine 1 comprim par jour pour la tension artrielle  Retour 4 mois

## 2021-11-10 NOTE — Assessment & Plan Note (Addendum)
History of some alcohol use did had elevations in liver functions t

## 2021-11-10 NOTE — Assessment & Plan Note (Signed)
Has hypertension as a diagnosis plan is to begin amlodipine 5 mg daily

## 2021-12-03 ENCOUNTER — Encounter: Payer: Self-pay | Admitting: *Deleted

## 2021-12-03 NOTE — Congregational Nurse Program (Signed)
  Dept: [phone]   Congregational Nurse Program Note  Date of Encounter: 12/03/2021  Past Medical History: Past Medical History:  Diagnosis Date   Gout 12/30/2017    Encounter Details:  CNP Questionnaire - 12/03/21 1513       Questionnaire   Do you give verbal consent to treat you today? Yes    Location Patient [place]    Visit Setting Home    Patient Status Immigrant    Insurance [facility]    Insurance Referral N/A    Medication N/A    Medical Provider Yes    Medical Referral N/A    Medical Appointment Made N/A    Food Have Food Insecurities    Transportation Need transportation assistance    Housing/Utilities No permanent housing    Interpersonal Safety N/A    Intervention Blood pressure;Advocate;Educate;Support    ED Visit Averted Yes    Life-Saving Intervention Made N/A

## 2021-12-03 NOTE — Congregational Nurse Program (Signed)
  Dept: [phone]   Congregational Nurse Program Note  Date of Encounter: 12/03/2021  Past Medical History: Past Medical History:  Diagnosis Date   Gout 12/30/2017    Encounter Details:  CNP Questionnaire - 12/03/21 1300       Questionnaire   Do you give verbal consent to treat you today? Yes    Location Patient [place]    Visit Setting Home    Patient Status Immigrant    Insurance [facility]    Insurance Referral N/A    Medication N/A    Medical Provider Yes    Screening Referrals N/A    Medical Referral N/A   [facility] clinic   Medical Appointment Made N/A    Food Have Food Insecurities    Transportation Need transportation assistance    Housing/Utilities No permanent housing    Interpersonal Safety N/A    Intervention Advocate;Support;Navigate Healthcare System;Educate    ED Visit Averted Yes    Life-Saving Intervention Made N/A            Met with client at his sister's home.  BP 151/97 HR 86 and then rechecked 152/91 HR 84.  Client tells me that he is taking his BP medication morning and evening.  He says he did take his medication this morning.  Will message Dr. Joya Gaskins to let him know of BP results.  Client also requested help with disability application.  Will reach out to a case manager and will seek assistance for client. Will follow up with client.  Karene Fry, RN, MSN, [place] Office [phone] Cell

## 2021-12-04 ENCOUNTER — Telehealth: Payer: Self-pay | Admitting: Critical Care Medicine

## 2021-12-04 NOTE — Telephone Encounter (Signed)
Brandon Arias can you get this pt in next week to see you for his BP  he is still out of range on low dose amlodipine 150/90  My schedule is packed  Last seen by me July 17  Genie thanks for letting us know

## 2021-12-04 NOTE — Telephone Encounter (Signed)
Hey friend,   If I have a space available with another BP visit on either Monday or Thursday next week, can you double-book him with me?

## 2021-12-08 NOTE — Telephone Encounter (Signed)
Is any slot fine to double book?

## 2021-12-08 NOTE — Telephone Encounter (Signed)
Any slot that is shared with another BP visit. You're the best!

## 2021-12-09 NOTE — Telephone Encounter (Signed)
Called patient and left voicemail   Interpreter [phone]

## 2021-12-09 NOTE — Telephone Encounter (Signed)
Please reach out to Roderic Palau with the Congregational Nurse Program [phone] to get the appointment scheduled with [facility].

## 2021-12-10 NOTE — Telephone Encounter (Signed)
Called Ms.Genie and left voicemail for appointment scheduling

## 2021-12-10 NOTE — Telephone Encounter (Signed)
Jeannie, Congregational, returned call back for patient regarding scheduling an appt with [facility] for his BP. Please cal back

## 2021-12-11 NOTE — Telephone Encounter (Signed)
Please follow up

## 2021-12-11 NOTE — Telephone Encounter (Signed)
Brandon Arias w/ Congregational Nurse Program checking status of return call  Please assist further

## 2021-12-12 ENCOUNTER — Encounter: Payer: Self-pay | Admitting: *Deleted

## 2021-12-12 NOTE — Telephone Encounter (Signed)
Called Ms.Genie and made the first available appointment.

## 2021-12-12 NOTE — Congregational Nurse Program (Signed)
  Dept: [phone]   Congregational Nurse Program Note  Date of Encounter: 12/12/2021  Past Medical History: Past Medical History:  Diagnosis Date   Gout 12/30/2017    Encounter Details:  CNP Questionnaire - 12/12/21 1029       Questionnaire   Do you give verbal consent to treat you today? Yes    Location Patient [employer]    Visit Setting Phone/Text/Email    Patient Status Immigrant    Insurance [facility]    Insurance Referral N/A    Medication N/A    Medical Provider Yes    Screening Referrals N/A    Medical Referral Other    Medical Appointment Made Other    Food Have Food Insecurities    Transportation Need transportation assistance    Housing/Utilities No permanent housing    Interpersonal Safety N/A    Intervention Advocate;Educate;Support    ED Visit Averted Yes    Life-Saving Intervention Made N/A            Call received from Dr. Lynelle Doctor office from [place].  Appointment made with Brandon Arias, pharmacist, for September 22 at 11 am.  I have texted Brandon Arias with information and will assure that he is aware of appointment.  Will follow up with Brandon Arias as needed.  Roderic Palau, RN, MSN, CNP [phone] Office [phone] Cell

## 2022-01-16 ENCOUNTER — Ambulatory Visit: Payer: Medicaid Other | Attending: Critical Care Medicine | Admitting: Pharmacist

## 2022-01-16 ENCOUNTER — Encounter: Payer: Self-pay | Admitting: Pharmacist

## 2022-01-16 ENCOUNTER — Other Ambulatory Visit: Payer: Self-pay

## 2022-01-16 VITALS — BP 137/92 | HR 93

## 2022-01-16 DIAGNOSIS — I10 Essential (primary) hypertension: Secondary | ICD-10-CM

## 2022-01-16 MED ORDER — AMLODIPINE BESYLATE 10 MG PO TABS
10.0000 mg | ORAL_TABLET | Freq: Every day | ORAL | 1 refills | Status: DC
  Filled 2022-01-16: qty 90, 90d supply, fill #0

## 2022-01-16 NOTE — Progress Notes (Signed)
   S:     No chief complaint on file.  Brandon Arias is a 51 y.o. male who presents for hypertension evaluation, education, and management.  PMH is significant for HTN, gout, lumbar radiculopathy.  Patient was referred and last seen by Primary Care Provider, Dr. Joya Gaskins, on 11/10/2021.   At that visit, Dr. Joya Gaskins added amlodipine.   Today, patient arrives in good spirits and presents without assistance. Denies dizziness, headache, blurred vision, swelling. He is taking his amlodipine and has no complaints today.    Family/Social history:  -Fhx: no pertinent positives  -Tobacco: never smoker  -Alcohol: none reported   Medication adherence reported. Patient has taken BP medications today.   Current antihypertensives include: amlodipine 5 mg daily  Reported home BP readings: none  Patient reported dietary habits:  -Compliant with sodium restriction  -Denies drinking excessive caffeine  -Names DASH diet and tries to emphasize fruits, vegetables and lean proteins in his diet   Patient-reported exercise habits: no formal exercise regimen  O:  Vitals:   01/16/22 1123  BP: (!) 137/92  Pulse: 93   Last 3 Office BP readings: BP Readings from Last 3 Encounters:  01/16/22 (!) 137/92  12/03/21 (!) 152/91  11/10/21 (!) 150/90    BMET    Component Value Date/Time   NA 147 (H) 06/02/2021 1042   K 4.6 06/02/2021 1042   CL 108 (H) 06/02/2021 1042   CO2 21 06/02/2021 1042   GLUCOSE 71 06/02/2021 1042   GLUCOSE 83 01/31/2021 1332   BUN 14 06/02/2021 1042   CREATININE 0.71 (L) 06/02/2021 1042   CALCIUM 8.5 (L) 06/02/2021 1042   GFRNONAA >60 01/31/2021 1332   GFRAA 123 09/28/2019 1603    Renal function: CrCl cannot be calculated (Patient's most recent lab result is older than the maximum 21 days allowed.).  Clinical ASCVD: No  The ASCVD Risk score (Arnett DK, et al., 2019) failed to calculate for the following reasons:   The valid total cholesterol range is 130 to 320  mg/dL  Patient is participating in a Managed Medicaid Plan:  Yes    A/P: Hypertension diagnosed currently above goal but improved on current medications. BP goal < 130/80 mmHg. Medication adherence appears appropriate.  -Increased dose of amlodipine to 10 mg daily. Emphasized to patient that he must stop the current 5mg  dose. He verbalizes understanding.   -Patient educated on purpose, proper use, and potential adverse effects of amlodipine.  -F/u labs ordered - none -Counseled on lifestyle modifications for blood pressure control including reduced dietary sodium, increased exercise, adequate sleep. -Encouraged patient to check BP at home and bring log of readings to next visit. Counseled on proper use of home BP cuff.    Results reviewed and written information provided.    Written patient instructions provided. Patient verbalized understanding of treatment plan.  Total time in face to face counseling 30 minutes.    Follow-up:  Pharmacist in 1 month.  Benard Halsted, PharmD, [place], [place] [phone]

## 2022-01-20 ENCOUNTER — Encounter: Payer: Self-pay | Admitting: *Deleted

## 2022-01-21 NOTE — Congregational Nurse Program (Signed)
  Dept: [phone]   Congregational Nurse Program Note  Date of Encounter: 01/20/2022  Past Medical History: Past Medical History:  Diagnosis Date   Gout 12/30/2017    Encounter Details:  CNP Questionnaire - 01/20/22 1900       Questionnaire   Do you give verbal consent to treat you today? Yes    Location Patient [employer]    Visit Setting Phone/Text/Email;Home    Patient Status Immigrant    Insurance [facility]    Insurance Referral N/A    Medication N/A    Medical Provider Yes    Screening Referrals N/A    Medical Referral Other;N/A    Medical Appointment Made Other;N/A    Food Have Food Insecurities    Transportation Need transportation assistance    Housing/Utilities No permanent housing    Interpersonal Safety N/A    Intervention Advocate;Educate;Support    ED Visit Averted Yes    Life-Saving Intervention Made N/A            This CN met with client.  Client assures me that he is taking his medications as directed.  Client did go to an appointment on 10/22 at 11 am.  This CN spoke with client three times and spoke with the pharmacist at [place].  Medications adjustment was made by pharmacist and this was explained to the client.  Client rode the bus to his appointment and he has his bus pass for transportation.  This CN will follow up with client next week as requested by client.  Karene Fry, RN, MSN, [place] Office [phone] Cell

## 2022-02-03 ENCOUNTER — Encounter: Payer: Self-pay | Admitting: Critical Care Medicine

## 2022-02-03 ENCOUNTER — Encounter: Payer: Self-pay | Admitting: *Deleted

## 2022-02-03 NOTE — Congregational Nurse Program (Signed)
  Dept: [phone]   Congregational Nurse Program Note  Date of Encounter: 02/03/2022  Past Medical History: Past Medical History:  Diagnosis Date   Gout 12/30/2017    Encounter Details:  CNP Questionnaire - 02/03/22 1300       Questionnaire   Ask client: Do you give verbal consent for me to treat you today? Yes    Student Assistance N/A    Location Patient [place]    Visit Setting with Client Home    Patient Status Migrant;Unhoused    Insurance [employer]    Insurance/Financial Assistance Referral N/A    Medication Have Medication Insecurities    Medical Provider Yes    Screening Referrals Made N/A    Medical Referrals Made N/A    Medical Appointment Made Other;N/A    Recently w/o PCP, now 1st time PCP visit completed due to CNs referral or appointment made N/A    Food Have Food Insecurities    Transportation Need transportation assistance    Housing/Utilities No permanent housing    Interpersonal Safety N/A    Interventions [place];Advocate/Support;Counsel    Abnormal to Normal Screening Since Last CN Visit N/A    Screenings CN Performed N/A    Sent Client to Lab for: N/A    Did client attend any of the following based off CNs referral or appointments made? N/A    ED Visit Averted Yes    Life-Saving Intervention Made N/A      Questionnaire   Do you give verbal consent to treat you today? Yes    Location Patient [employer]    Visit Setting Phone/Text/Email;Home    Patient Status Immigrant    Insurance [facility]    Insurance Referral N/A    Medication N/A    Screening Referrals N/A    Intervention Advocate;Educate;Support            Met with client.  Client feels much better and states that he needs to go back to work.  Client has asked this CN to contact Dr. Joya Gaskins to procure a work release.  I have sent a staff message to Dr. Joya Gaskins with client request.  Client is walking well, getting in/out of the car well and is  going up/down stairs without difficulty.  Client does not qualify for disability and is currently jobless and in financial difficulty.  Will follow up with client after I hear back from Dr. Joya Gaskins.  Karene Fry, RN, MSN, [place] Office [phone] Cell

## 2022-02-18 NOTE — Progress Notes (Deleted)
   S:     PCP: Dr. Joya Gaskins  51 y.o. male who presents for hypertension evaluation, education, and management. PMH is significant for HTN and gout.   Patient was referred and last seen by Primary Care Provider, Dr. Joya Gaskins, on 11/10/2021. At that time, his BP was elevated and amlodipine was started.   At last visit with the clinical pharmacist, BP was still elevated at 137/92. Amlodipine dose was increased at that time.   Today, patient arrives in *** spirits and presents without *** assistance. *** Denies dizziness, headache, blurred vision, swelling.   Patient reports hypertension was diagnosed in ***.   Family/Social history:  -Fhx: no pertinent positives  -Tobacco: never smoker  -Alcohol: none reported   Medication adherence *** . Patient has *** taken BP medications today.   Current antihypertensives include: amlodipine 10mg  once daily   Antihypertensives tried in the past include: none  Reported home BP readings: ***  Patient reported dietary habits:  -Compliant with sodium restriction  -Denies drinking excessive caffeine  -Names DASH diet and tries to emphasize fruits, vegetables and lean proteins in his diet    Patient-reported exercise habits: no formal exercise regimen  O:    Last 3 Office BP readings: BP Readings from Last 3 Encounters:  01/16/22 (!) 137/92  12/03/21 (!) 152/91  11/10/21 (!) 150/90    BMET    Component Value Date/Time   NA 147 (H) 06/02/2021 1042   K 4.6 06/02/2021 1042   CL 108 (H) 06/02/2021 1042   CO2 21 06/02/2021 1042   GLUCOSE 71 06/02/2021 1042   GLUCOSE 83 01/31/2021 1332   BUN 14 06/02/2021 1042   CREATININE 0.71 (L) 06/02/2021 1042   CALCIUM 8.5 (L) 06/02/2021 1042   GFRNONAA >60 01/31/2021 1332   GFRAA 123 09/28/2019 1603    Renal function: CrCl cannot be calculated (Patient's most recent lab result is older than the maximum 21 days allowed.).  Clinical ASCVD: No  The ASCVD Risk score (Arnett DK, et al., 2019) failed  to calculate for the following reasons:   The valid total cholesterol range is 130 to 320 mg/dL   A/P: Hypertension diagnosed *** currently *** on current medications. BP goal < 130/80 *** mmHg. Medication adherence appears ***. Control is suboptimal due to ***.  -{Meds adjust:18428} ***.  -Patient educated on purpose, proper use, and potential adverse effects of ***.  -F/u labs ordered - *** -Counseled on lifestyle modifications for blood pressure control including reduced dietary sodium, increased exercise, adequate sleep. -Encouraged patient to check BP at home and bring log of readings to next visit. Counseled on proper use of home BP cuff.    Results reviewed and written information provided.    Written patient instructions provided. Patient verbalized understanding of treatment plan.  Total time in face to face counseling *** minutes.    Follow-up:  Pharmacist ***. PCP clinic visit in November  Maryan Puls, PharmD PGY-1 [facility] Pharmacy Resident

## 2022-02-19 ENCOUNTER — Ambulatory Visit: Payer: Medicaid Other | Admitting: Pharmacist

## 2022-02-19 ENCOUNTER — Encounter: Payer: Self-pay | Admitting: *Deleted

## 2022-02-19 NOTE — Congregational Nurse Program (Signed)
  Dept: [phone]   Congregational Nurse Program Note  Date of Encounter: 02/19/2022  Past Medical History: Past Medical History:  Diagnosis Date   Gout 12/30/2017    Encounter Details:  CNP Questionnaire - 02/19/22 1130       Questionnaire   Ask client: Do you give verbal consent for me to treat you today? Yes    Student Assistance N/A    Location Patient [place]    Visit Setting with Client Home    Patient Status Migrant;Unhoused    Insurance [employer]    Insurance/Financial Assistance Referral N/A    Medication Have Medication Insecurities    Medical Provider Yes    Screening Referrals Made N/A    Medical Referrals Made N/A    Medical Appointment Made Other;N/A    Recently w/o PCP, now 1st time PCP visit completed due to CNs referral or appointment made N/A    Food Have Food Insecurities    Transportation Need transportation assistance    Housing/Utilities No permanent housing    Interpersonal Safety N/A    Interventions [place];Advocate/Support;Counsel    Abnormal to Normal Screening Since Last CN Visit N/A    Screenings CN Performed N/A    Sent Client to Lab for: N/A    Did client attend any of the following based off CNs referral or appointments made? N/A    ED Visit Averted Yes    Life-Saving Intervention Made N/A      Questionnaire   Do you give verbal consent to treat you today? Yes    Location Patient [place]    Visit Setting Phone/Text/Email;Home    Patient Status Immigrant    Insurance [facility]    Insurance Referral N/A    Medication N/A    Screening Referrals N/A    Intervention Advocate;Educate;Support            client called this CN to inform me that his appointment at [facility] had been canceled for today and rescheduled for tomorrow.  Client plans to take the bus to his appointment tomorrow for BP follow up.  This CN will f/u as needed.  Karene Fry, RN, MSN, [place] Office [phone]  Cell

## 2022-02-20 ENCOUNTER — Encounter: Payer: Self-pay | Admitting: Pharmacist

## 2022-02-20 ENCOUNTER — Ambulatory Visit: Payer: Medicaid Other | Attending: Critical Care Medicine | Admitting: Pharmacist

## 2022-02-20 VITALS — BP 114/77 | HR 101

## 2022-02-20 DIAGNOSIS — I10 Essential (primary) hypertension: Secondary | ICD-10-CM | POA: Diagnosis not present

## 2022-02-20 NOTE — Progress Notes (Signed)
   S:     No chief complaint on file.  Brandon Arias is a 51 y.o. male who presents for hypertension evaluation, education, and management.  PMH is significant for HTN, gout, lumbar radiculopathy.  Patient was referred and last seen by Primary Care Provider, Dr. Joya Gaskins, on 11/10/2021. I saw him last month and increased his dose of amlodipine.  Today, patient arrives in good spirits and presents without assistance. Denies dizziness, headache, blurred vision, swelling. He is taking his amlodipine and denies any side effects since increasing the dose.   Family/Social history:  -Fhx: no pertinent positives  -Tobacco: never smoker  -Alcohol: none reported   Medication adherence reported. Patient has taken BP medications today.   Current antihypertensives include: amlodipine 10 mg daily  Reported home BP readings: none  Patient reported dietary habits:  -Compliant with sodium restriction  -Denies drinking excessive caffeine  -Names DASH diet and tries to emphasize fruits, vegetables and lean proteins in his diet   Patient-reported exercise habits: no formal exercise regimen  O:  Vitals:   02/20/22 1020  BP: 114/77  Pulse: (!) 101    Last 3 Office BP readings: BP Readings from Last 3 Encounters:  02/20/22 114/77  01/16/22 (!) 137/92  12/03/21 (!) 152/91    BMET    Component Value Date/Time   NA 147 (H) 06/02/2021 1042   K 4.6 06/02/2021 1042   CL 108 (H) 06/02/2021 1042   CO2 21 06/02/2021 1042   GLUCOSE 71 06/02/2021 1042   GLUCOSE 83 01/31/2021 1332   BUN 14 06/02/2021 1042   CREATININE 0.71 (L) 06/02/2021 1042   CALCIUM 8.5 (L) 06/02/2021 1042   GFRNONAA >60 01/31/2021 1332   GFRAA 123 09/28/2019 1603    Renal function: CrCl cannot be calculated (Patient's most recent lab result is older than the maximum 21 days allowed.).  Clinical ASCVD: No  The ASCVD Risk score (Arnett DK, et al., 2019) failed to calculate for the following reasons:   The valid total  cholesterol range is 130 to 320 mg/dL  Patient is participating in a Managed Medicaid Plan:  Yes    A/P: Hypertension diagnosed currently at goal on current medications. BP goal < 130/80 mmHg. Medication adherence appears appropriate.  -Continue amlodipine 10 mg daily.  -Patient educated on purpose, proper use, and potential adverse effects of amlodipine.  -F/u labs ordered - none -Counseled on lifestyle modifications for blood pressure control including reduced dietary sodium, increased exercise, adequate sleep. -Encouraged patient to check BP at home and bring log of readings to next visit. Counseled on proper use of home BP cuff.    Results reviewed and written information provided.    Written patient instructions provided. Patient verbalized understanding of treatment plan. Total time in face to face counseling 30 minutes.    Follow-up:  With Dr. Margarita Rana 03/17/2022.  Benard Halsted, PharmD, [place], [place] [phone]

## 2022-02-23 NOTE — Progress Notes (Signed)
Office Visit Note  Patient: Brandon Arias             Date of Birth: 10-31-1970           MRN: [id number]             PCP: Elsie Stain, MD Referring: Elsie Stain, MD Visit Date: 03/06/2022 Occupation: _0 @  Interpreter:Avante Moussa  Subjective:  Pain in multiple joints  History of Present Illness: Colson Barco is a 51 y.o. male with history of tophaceous gout.  He has been seen in consultation per request of Dr. Joya Gaskins.  According to the patient his symptoms a started about 6 years ago with pain and swelling in his elbows, knees, ankles and his feet.  He states he has seen doctors in [place] in [place].  He has been given medications over the years.  He states when he takes medications he feels better and then after stopping medications his symptoms come back.  He gets frequent flares.  His last flare was in October 2023.  He currently does not have any discomfort.  He states he takes allopurinol 100 mg only 1 tablet currently.  He continued other medications .  He does not recall the diagnosis of hypertension.  He is currently not taking amlodipine.  He does not recall that he has elevated liver functions.  He continues to have lower back pain.  He had seen Dr. Louanne Skye in the past.  He would like to go back to the back specialist.  He states he has difficulty keeping appointments with the doctors as he does not understand the phone calls.  He does not consume any alcohol.  Activities of Daily Living:  Patient reports morning stiffness for all day. Patient Reports nocturnal pain.  Difficulty dressing/grooming: Reports Difficulty climbing stairs: Denies Difficulty getting out of chair: Denies Difficulty using hands for taps, buttons, cutlery, and/or writing: Denies  Review of Systems  Constitutional:  Positive for fatigue.  HENT:  Negative for mouth sores and mouth dryness.   Eyes:  Negative for dryness.  Respiratory:  Negative for shortness of breath.    Cardiovascular:  Negative for chest pain and palpitations.  Gastrointestinal:  Positive for constipation. Negative for blood in stool and diarrhea.  Endocrine: Negative for increased urination.  Genitourinary:  Negative for involuntary urination.  Musculoskeletal:  Positive for joint pain, joint pain, joint swelling and morning stiffness. Negative for gait problem, myalgias, muscle weakness, muscle tenderness and myalgias.  Skin:  Negative for color change, rash, hair loss and sensitivity to sunlight.  Allergic/Immunologic: Negative for susceptible to infections.  Neurological:  Negative for dizziness and headaches.  Hematological:  Negative for swollen glands.  Psychiatric/Behavioral:  Negative for depressed mood and sleep disturbance. The patient is not nervous/anxious.     PMFS History:  Patient Active Problem List   Diagnosis Date Noted   Elevated liver function tests 08/14/2021   Lumbar radiculopathy 06/02/2021   Gout, tophaceous 06/02/2021   Colon cancer screening 06/02/2021   Language barrier affecting health care 06/02/2021   Essential hypertension 12/31/2020    Past Medical History:  Diagnosis Date   Gout 12/30/2017    History reviewed. No pertinent family history. Past Surgical History:  Procedure Laterality Date   no known surgeries     Social History   Social History Narrative   Not on file   Immunization History  Administered Date(s) Administered   Influenza,inj,Quad PF,6+ Mos 05/23/2019   Tdap 09/23/2020     Objective:  Vital Signs: BP (!) 138/94 (BP Location: Right Arm, Patient Position: Sitting, Cuff Size: Normal)   Pulse 81   Resp 17   Ht _0  (1.702 m)   Wt 164 lb 9.6 oz (74.7 kg)   BMI 25.78 kg/m    Physical Exam Vitals and nursing note reviewed.  Constitutional:      Appearance: He is well-developed.  HENT:     Head: Normocephalic and atraumatic.  Eyes:     Conjunctiva/sclera: Conjunctivae normal.     Pupils: Pupils are equal, round, and  reactive to light.  Cardiovascular:     Rate and Rhythm: Normal rate and regular rhythm.     Heart sounds: Normal heart sounds.  Pulmonary:     Effort: Pulmonary effort is normal.     Breath sounds: Normal breath sounds.  Abdominal:     General: Bowel sounds are normal.     Palpations: Abdomen is soft.  Musculoskeletal:     Cervical back: Normal range of motion and neck supple.  Skin:    General: Skin is warm and dry.     Capillary Refill: Capillary refill takes less than 2 seconds.  Neurological:     Mental Status: He is alert and oriented to person, place, and time.  Psychiatric:        Behavior: Behavior normal.      Musculoskeletal Exam: Cervical spine was in good range of motion.  Shoulder joints, elbow joints, wrist joints with good range of motion.  He had bilateral tophi over elbows.  He had thickening of bilateral second and third MCP joints.  PIP and DIP thickening was noted.  Hip joints and knee joints with good range of motion without any warmth swelling or effusion.  He had thickening of bilateral ankle joints.  He had thickening of MTP joints without any active swelling or tenderness.  CDAI Exam: CDAI Score: -- Patient Global: --; Provider Global: -- Swollen: --; Tender: -- Joint Exam 03/06/2022   No joint exam has been documented for this visit   There is currently no information documented on the homunculus. Go to the Rheumatology activity and complete the homunculus joint exam.  Investigation: No additional findings.  Imaging: No results found.  Recent Labs: Lab Results  Component Value Date   WBC 6.9 06/02/2021   HGB 10.1 (L) 06/02/2021   PLT 184 06/02/2021   NA 147 (H) 06/02/2021   K 4.6 06/02/2021   CL 108 (H) 06/02/2021   CO2 21 06/02/2021   GLUCOSE 71 06/02/2021   BUN 14 06/02/2021   CREATININE 0.71 (L) 06/02/2021   BILITOT 0.8 08/14/2021   ALKPHOS 184 (H) 08/14/2021   AST 166 (H) 08/14/2021   ALT 62 (H) 08/14/2021   PROT 8.4 08/14/2021    ALBUMIN 3.5 (L) 08/14/2021   CALCIUM 8.5 (L) 06/02/2021   GFRAA 123 09/28/2019   QFTBGOLDPLUS Negative 08/14/2021    Speciality Comments: No specialty comments available.  Procedures:  No procedures performed Allergies: Patient has no known allergies.   Assessment / Plan:     Visit Diagnoses: Gout, tophaceous - 07/30/21: Uric acid 7.9, ESR 41, CRP 1.4. Allopurinol 100 mg daily.  -Patient gives history of gout for many years.  He states he has been evaluated by doctors in [place] in [place].  I am uncertain how long he has been taking allopurinol.  Patient states he has been on allopurinol 1 tablet daily.  He states he was taking other medications for gout which she stopped.  He does not recall the names of any of his medications.  Detailed counsel regarding gout was provided.  A handout on gout was given.  Dietary modifications were discussed.  He was advised to start colchicine 0.6 mg 1 tablet p.o. daily and increase the dose to twice daily for flares.  He will stay on allopurinol 200 mg p.o. daily.  We will check labs today and adjust the dose of allopurinol in the future as needed.  Today on examination he had tophi over bilateral elbows.  He also had soft tissue swelling of his left fifth finger, bilateral MCP and PIP thickening was noted.  Thickening of the left foot MTP joints was noted without any discomfort.  Plan: Sedimentation rate, Uric acid, allopurinol (ZYLOPRIM) 100 MG tablet, colchicine 0.6 MG tablet  Pain in both hands -he complains of pain and stiffness in his hands intermittently.  He denied any discomfort today.  He had bilateral MCP thickening and left fifth PIP thickening as described above.  Plan: Rheumatoid factor, Cyclic citrul peptide antibody, IgG, XR Hand 2 View Right, XR Hand 2 View Left.  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  Chronic pain of both knees -he complains of intermittent pain and discomfort in his bilateral knee joints.  No  warmth swelling or effusion was noted.  Plan: XR KNEE 3 VIEW RIGHT, XR KNEE 3 VIEW LEFT.  X-rays of bilateral knee joints were unremarkable.  Pain in both feet -he complains of pain and discomfort in his ankles and his feet intermittently.  No synovitis was noted today.  He had thickening of all MTP joints of the left foot.  Plan: XR Foot 2 Views Right, XR Foot 2 Views Left x-rays were consistent with the osteoarthritis and gouty arthropathy overlap.  Medication monitoring encounter -he has been taking allopurinol.  I will obtain labs today.  Plan: CBC with Differential/Platelet, COMPLETE METABOLIC PANEL WITH GFR  Lumbar radiculopathy -he complains of ongoing pain and discomfort in his lower back.  Plan: AMB referral to orthopedics.  He was evaluated by Dr. Louanne Skye in the past and then lost follow-up as he states that he cannot understand with the phone calls come for appointment reminders.  We will refer him to Dr. Laurance Flatten.  Essential hypertension-pressure was elevated today.  He does not remember that he has hypertension.  He he does not recall taking amlodipine.  Amlodipine was recently prescribed by the pharmacist on February 20, 2022.  I advised him to schedule a follow-up appointment with Dr. Joya Gaskins and also with the pharmacist.  Elevated liver function tests -I am uncertain about the etiology of his elevated LFTs.  Patient states he does not consume any alcohol.  Although when I reviewed notes from Dr. Joya Gaskins he did mention alcohol use.  I will obtain hepatitis panel and also we will refer him to gastroenterologist.  Plan: Hepatitis B core antibody, IgM, Hepatitis B surface antigen, Hepatitis C antibody, Ambulatory referral to Gastroenterology  Language barrier affecting health care-and interpreter was present today.  Patient is having difficulty keeping his appointments and taking his medications due to language barrier.  Orders: Orders Placed This Encounter  Procedures   XR Hand 2 View Right    XR Hand 2 View Left   XR KNEE 3 VIEW RIGHT   XR KNEE 3 VIEW LEFT   XR Foot 2 Views Right   XR Foot 2 Views Left   CBC with Differential/Platelet   COMPLETE METABOLIC PANEL WITH GFR   Sedimentation  rate   Uric acid   Rheumatoid factor   Cyclic citrul peptide antibody, IgG   Hepatitis B core antibody, IgM   Hepatitis B surface antigen   Hepatitis C antibody   Ambulatory referral to Gastroenterology   AMB referral to orthopedics   Meds ordered this encounter  Medications   allopurinol (ZYLOPRIM) 100 MG tablet    Sig: Take 1 tablet (100 mg total) by mouth 2 (two) times daily.    Dispense:  60 tablet    Refill:  2   colchicine 0.6 MG tablet    Sig: Take 1 tablet by mouth daily and increase to 2 tablets by mouth daily for a gout flare.    Dispense:  60 tablet    Refill:  2    Face-to-face time spent with patient was 80 minutes. Greater than 50% of time was spent in counseling and coordination of care.  Follow-Up Instructions: Return for Gout.   Bo Merino, MD  Note - This record has been created using Editor, commissioning.  Chart creation errors have been sought, but may not always  have been located. Such creation errors do not reflect on  the standard of medical care.

## 2022-03-06 ENCOUNTER — Ambulatory Visit (INDEPENDENT_AMBULATORY_CARE_PROVIDER_SITE_OTHER): Payer: Medicaid Other

## 2022-03-06 ENCOUNTER — Ambulatory Visit: Payer: Medicaid Other | Attending: Rheumatology | Admitting: Rheumatology

## 2022-03-06 ENCOUNTER — Ambulatory Visit: Payer: Medicaid Other

## 2022-03-06 ENCOUNTER — Encounter: Payer: Self-pay | Admitting: Rheumatology

## 2022-03-06 VITALS — BP 138/94 | HR 81 | Resp 17 | Ht 67.0 in | Wt 164.6 lb

## 2022-03-06 DIAGNOSIS — M79641 Pain in right hand: Secondary | ICD-10-CM

## 2022-03-06 DIAGNOSIS — M25562 Pain in left knee: Secondary | ICD-10-CM

## 2022-03-06 DIAGNOSIS — M25561 Pain in right knee: Secondary | ICD-10-CM

## 2022-03-06 DIAGNOSIS — M79671 Pain in right foot: Secondary | ICD-10-CM | POA: Diagnosis not present

## 2022-03-06 DIAGNOSIS — M1A9XX1 Chronic gout, unspecified, with tophus (tophi): Secondary | ICD-10-CM | POA: Diagnosis not present

## 2022-03-06 DIAGNOSIS — G8929 Other chronic pain: Secondary | ICD-10-CM

## 2022-03-06 DIAGNOSIS — Z789 Other specified health status: Secondary | ICD-10-CM

## 2022-03-06 DIAGNOSIS — M79642 Pain in left hand: Secondary | ICD-10-CM | POA: Diagnosis not present

## 2022-03-06 DIAGNOSIS — Z5181 Encounter for therapeutic drug level monitoring: Secondary | ICD-10-CM

## 2022-03-06 DIAGNOSIS — R7989 Other specified abnormal findings of blood chemistry: Secondary | ICD-10-CM

## 2022-03-06 DIAGNOSIS — M5416 Radiculopathy, lumbar region: Secondary | ICD-10-CM

## 2022-03-06 DIAGNOSIS — M79672 Pain in left foot: Secondary | ICD-10-CM

## 2022-03-06 DIAGNOSIS — I10 Essential (primary) hypertension: Secondary | ICD-10-CM

## 2022-03-06 DIAGNOSIS — Z603 Acculturation difficulty: Secondary | ICD-10-CM

## 2022-03-06 MED ORDER — ALLOPURINOL 100 MG PO TABS: 100.0000 mg | ORAL_TABLET | Freq: Two times a day (BID) | ORAL | 2 refills | Status: DC

## 2022-03-06 MED ORDER — COLCHICINE 0.6 MG PO TABS: ORAL_TABLET | ORAL | 2 refills | Status: DC

## 2022-03-06 NOTE — Patient Instructions (Addendum)
Please take colchicine 0.6 mg tablet, 1 tablet daily.  If you have a gout flare then increase it to  Colchicine  0.6 mg 1 tablet twice a day.  Take allopurinol 100 mg, 2 tablets daily  Please call us if you have a gout flare.  We will refer you to a gastroenterologist for elevated liver function.  We will refer you to back a specialist Dr. Christell Constant for lower back pain.    Goutte Gout  La goutte est une maladie qui provoque un gonflement douloureux des articulations. La goutte est un type d'inflammation des articulations (arthrite). Un excs d'acide urique dans l'organisme est  l'origine de cette affection. L'acide urique est un compos chimique qui se forme lorsque l'organisme fractionne des substances appeles purines. Les purines sont importantes pour la constitution des protines de l'organisme. Lorsque l'organisme contient trop d'acide urique, des cristaux Marketing executive se former et Water engineer. Cela provoque des douleurs et [employer]. Les crises de Estée [employer]uder se produire rapidement et tre trs douloureuses (goutte aigu). Au fil du temps, les attaques peuvent affecter davantage d'articulations et devenir plus frquentes (goutte chronique). La goutte peut galement provoquer l'accumulation d'acide urique sous la peau et  Therapist, nutritional. Quelles sont les causes ? Un excs d'acide [employer] sang est  l'origine de cette affection. Cela peut se produire si : Vos reins n'liminent pas suffisamment d'acide urique de votre sang. C'est la cause la plus frquente. Votre organisme produit trop d'acide urique. Cela peut arriver Enis Slipper certains cancers et certains traitements contre le cancer. Cela peut galement se produire si votre organisme dtruit massivement les globules rouges (anmie hmolytique). Vous mangez trop d'aliments riches en purines. Parmi ces aliments, on trouve les abats et certains fruits de mer. L'alcool, en particulier la  bire, est galement riche en purines. Une attaque de goutte peut tre dclenche par un traumatisme ou un stress. Quels sont les facteurs qui augmentent le risque ? Les facteurs suivants peuvent augmenter le risque de dvelopper cette affection : Avoir des antcdents familiaux de goutte. tre un homme d'ge mr. tre une femme mnopause. Prendre certains mdicaments, notamment l'aspirine, la cyclosporine, les diurtiques, la lvodopa et la niacine. Avoir subi une transplantation d'organe. Souffrir de certaines affections, telles que : L'obsit. Un empoisonnement au plomb. Une maladie rnale. Une maladie de la peau appele  psoriasis . Il existe galement d'autres facteurs tels que : Une perte de poids trop rapide. Une dshydratation. Une consommation frquente [employer], en particulier de bire. Une consommation frquente de boissons contenant un type de sucre appel  fructose . Quels sont les signes ou symptmes ? Une attaque de goutte aigu se produit rapidement. Elle se produit habituellement dans une seule articulation. L'endroit le plus frquent est le gros orteil. Les attaques Fiserv. Les articulations des pieds, de la [place], du genou, des doigts, du poignet ou du coude peuvent galement tre touches. Les symptmes de cette affection comprennent : De fortes douleurs. Une sensation de Training and development officer. Un gonflement. Une raideur. Une sensibilit. L'articulation affecte peut tre trs douloureuse au toucher. Une peau brillante, rouge ou violette. Des frissons accompagns de fivre. La goutte chronique peut engendrer des symptmes plus frquemment. Davantage d'articulations peuvent tre impliques. Des nodules blancs ou jaunes (tophus) peuvent apparatre sur les mains ou les pieds ou dans d'autres endroits prs des articulations. Comment se fait le diagnostic ? Le diagnostic de cette affection repose sur les symptmes, les antcdents mdicaux et un examen  physique. Vous  devrez peut-tre effectuer des examens, tels que : Des tests sanguins permettant de Dietitian taux d'acide urique. Un prlvement de fluide articulaire  l'aide d'une aiguille fine (aspiration) pour [employer] cristaux d'acide urique. Des radiographies pour dceler des lsions articulaires. Comment cette affection est-elle traite ? Le traitement de cette affection comporte deux phases : le traitement d'une crise aigu et la prvention d'attaques futures. Le traitement de la goutte aigu peut inclure des mdicaments permettant de rduire les douleurs et les gonflements, notamment : Des anti-inflammatoires non strodiens (AINS), comme l'ibuprofne. Des strodes. Ce sont des anti-inflammatoires puissants qui peuvent tre pris par la bouche (voie orale) ou injects [employer]ning articulation. De la colchicine. Ce mdicament soulage les douleurs et les gonflements lorsqu'il est pris peu de temps aprs une attaque. Il peut tre administr par eBay ou par Humana Inc. Le traitement prventif peut comprendre : L'utilisation quotidienne de doses plus faibles d'AINS ou de colchicine. La prise d'un mdicament permettant de rduire les taux d'acide urique [employer] sang, par exemple, l'allopurinol. Des modifications des [employer]. Vous devrez peut-tre consulter un ditticien pour savoir ce qu'il convient de manger et boire afin de prvenir les crises de goutte. Suivez les instructions suivantes  domicile : Pharmacologist crise de goutte  Si le prestataire de soins de sant vous le recommande, appliquez de la glace sur la zone [place]. Pour ce faire : Estell Harpin de la glace dans un sac en plastique. Placez une serviette entre votre peau et le sac. Laissez la glace en place pendant 20 minutes, 2  3 fois par jour. Retirez la glace si votre peau devient rouge vif. Cela est trs important. Si vous ne ressentez Company secretary, la chaleur ou le froid, vous pourriez  endommager votre peau plus facilement. Soulevez (levez) l'articulation touche au-dessus du niveau de votre cour aussi souvent que possible. Veillez  reposer l'articulation le plus possible. Si l'articulation touche se situe au niveau de la [place], il vous faudra peut-tre [employer] Corporation. Suivez les instructions de IT trainer de soins de sant en ce qui concerne les restrictions relatives  ce que vous pouvez manger ou boire. Pour viter les crises de goutte futures Suivez un rgime  Environmental health practitioner en purine conformment Psychologist, educational de votre ditticien ou votre prestataire de soins de sant. vitez les aliments et les boissons riches en purines, y General Motors, les reins, les anchois, les asperges, [employer], Merrill Lynch, les [employer] et la bire. Maintenez un poids sant ou perdez du poids si vous tes en surpoids. Si vous dsirez perdre du poids, parlez-en  votre prestataire de soins de sant. [place]  ne pas perdre de poids trop rapidement. Commencez ou Gannett Co un programme d'exercice physique tel que recommand par votre prestataire de soins de sant. Alimentation et boissons vitez de consommer des boissons contenant du fructose. Buvez des quantits suffisantes de liquides pour garder votre urine jaune ple. Si vous consommez de l'alcool : Sports administrator consommation  : 1 verre par jour pour [employer] femmes qui ne sont pas enceintes. 2 verres par Energy East [employer]. Sachez quelle quantit d'alcool est contenue dans un verre. Aux .-U., un verre correspond  une bouteille de bire (355 ml [12 onces]),  un verre de vin (148 ml [5 onces]) ou  un verre  liqueur d'alcool fort (44 ml [1,5 once]). Instructions gnrales Prenez vos mdicaments en vente libre et sur ordonnance en suivant scrupuleusement les instructions de votre prestataire de soins de sant. Demandez  votre prestataire de soins de sant si vous pouvez conduire ou utiliser des machines lorsque vous  prenez le mdicament qu'il vous a prescrit. Reprenez vos activits habituelles en veillant  suivre les recommandations de votre prestataire de soins de sant. Demandez  votre prestataire de soins de sant quelles activits sont sans danger pour vous. Rendez-vous  toutes les visites de suivi. C'est important. Pour plus d'informations Marriott of Health (Federal-Mogul de la sant) : [email or web address].net/ Prenez Sales promotion account executive de soins de sant si vous avez : [place] crise de goutte. Des symptmes d'une crise de goutte qui persistent aprs 10 jours de Agricultural consultant. Des effets secondaires lis  la prise de vos mdicaments. Des frissons ou de la fivre. Une sensation de brlure lorsque vous urinez. Une Al[employer]a Inc bas du dos ou  l'abdomen. Vous devez consulter immdiatement si vous : Avez de fortes ou incontrlables douleurs. N'arrivez pas  uriner. Rsum La goutte est un gonflement douloureux des articulations caus par un excs d'acide urique dans l'organisme. La crise de goutte se Tour manager plus souvent au niveau du gros orteil, mais d'autres TEFL teacher tre touches. Les mdicaments et [employer]AC[employer] modification des [employer] aider  prvenir et  traiter les crises de goutte. Ces conseils et renseignements ne sauraient se substituer  l'avis mdical de votre prestataire de soins de sant. Par consquent, il est primordial de parler de toutes vos proccupations avec votre prestataire de soins de sant. Document Revised: 01/31/2021 Document Reviewed: 01/31/2021 Elsevier Patient Education  2023 Elsevier Inc.  Low-Purine Eating Plan A low-purine eating plan involves making food choices to limit your purine intake. Purine is a kind of uric acid. Too much uric acid in your blood can cause certain conditions, such as gout and kidney stones. Eating a low-purine diet may help control these conditions. What are tips for following  this plan? Shopping Avoid buying products that contain high-fructose corn syrup. Check for this on food labels. It is commonly found in many processed foods and soft drinks. Be sure to check for it in baked goods such as cookies, canned fruits, and cereals and cereal bars. Avoid buying veal, chicken breast with skin, lamb, and organ meats such as liver. These types of meats tend to have the highest purine content. Choose dairy products. These may lower uric acid levels. Avoid certain types of fish. Not all fish and seafood have high purine content. Examples with high purine content include anchovies, trout, tuna, sardines, and salmon. Avoid buying beverages that contain alcohol, particularly beer and hard liquor. Alcohol can affect the way your body gets rid of uric acid. Meal planning  Learn which foods do or do not affect you. If you find out that a food tends to cause your gout symptoms to flare up, avoid eating that food. You can enjoy foods that do not cause problems. If you have any questions about a food item, talk with your dietitian or health care provider. Reduce the overall amount of meat in your diet. When you do eat meat, choose ones with lower purine content. Include plenty of fruits and vegetables. Although some vegetables may have a high purine content--such as asparagus, mushrooms, spinach, or cauliflower--it has been shown that these do not contribute to uric acid blood levels as much. Consume at least 1 dairy serving a day. This has been shown to decrease uric acid levels. General information If you drink alcohol: Limit how much you have to: 0-1 drink a day  for women who are not pregnant. 0-2 drinks a day for men. Know how much alcohol is in a drink. In the [place] one drink equals one 12 oz bottle of beer (355 mL), one 5 oz glass of wine (148 mL), or one 1 oz glass of hard liquor (44 mL). Drink plenty of water. Try to drink enough to keep your urine pale yellow. Fluids can help  remove uric acid from your body. Work with your health care provider and dietitian to develop a plan to achieve or maintain a healthy weight. Losing weight may help reduce uric acid in your blood. What foods are recommended? The following are some types of foods that are good choices when limiting purine intake: Fresh or frozen fruits and vegetables. Whole grains, breads, cereals, and pasta. Rice. Beans, peas, legumes. Nuts and seeds. Dairy products. Fats and oils. The items listed above may not be a complete list. Talk with a dietitian about what dietary choices are best for you. What foods are not recommended? Limit your intake of foods high in purines, including: Beer and other alcohol. Meat-based gravy or sauce. Canned or fresh fish, such as: Anchovies, sardines, herring, salmon, and tuna. Mussels and scallops. Codfish, trout, and haddock. Bacon, veal, chicken breast with skin, and lamb. Organ meats, such as: Liver or kidney. Tripe. Sweetbreads (thymus gland or pancreas). Wild Education officer, environmental. Yeast or yeast extract supplements. Drinks sweetened with high-fructose corn syrup, such as soda. Processed foods made with high-fructose corn syrup. The items listed above may not be a complete list of foods and beverages you should limit. Contact a dietitian for more information. Summary Eating a low-purine diet may help control conditions caused by too much uric acid in the body, such as gout or kidney stones. Choose low-purine foods, limit alcohol, and limit high-fructose corn syrup. You will learn over time which foods do or do not affect you. If you find out that a food tends to cause your gout symptoms to flare up, avoid eating that food. This information is not intended to replace advice given to you by your health care provider. Make sure you discuss any questions you have with your health care provider. Document Revised: 03/27/2021 Document Reviewed: 03/27/2021 Elsevier Patient  Education  2023 [employer].

## 2022-03-09 LAB — HEPATITIS C ANTIBODY: Hepatitis C Ab: REACTIVE — AB

## 2022-03-09 LAB — COMPLETE METABOLIC PANEL WITH GFR
AG Ratio: 0.5 (calc) — ABNORMAL LOW (ref 1.0–2.5)
ALT: 45 U/L (ref 9–46)
AST: 132 U/L — ABNORMAL HIGH (ref 10–35)
Albumin: 3 g/dL — ABNORMAL LOW (ref 3.6–5.1)
Alkaline phosphatase (APISO): 128 U/L (ref 35–144)
BUN: 9 mg/dL (ref 7–25)
CO2: 22 mmol/L (ref 20–32)
Calcium: 8 mg/dL — ABNORMAL LOW (ref 8.6–10.3)
Chloride: 101 mmol/L (ref 98–110)
Creat: 0.8 mg/dL (ref 0.70–1.30)
Globulin: 6.4 g/dL (calc) — ABNORMAL HIGH (ref 1.9–3.7)
Glucose, Bld: 104 mg/dL — ABNORMAL HIGH (ref 65–99)
Potassium: 3.5 mmol/L (ref 3.5–5.3)
Sodium: 135 mmol/L (ref 135–146)
Total Bilirubin: 3 mg/dL — ABNORMAL HIGH (ref 0.2–1.2)
Total Protein: 9.4 g/dL — ABNORMAL HIGH (ref 6.1–8.1)
eGFR: 107 mL/min/{1.73_m2} (ref >=60)

## 2022-03-09 LAB — CBC WITH DIFFERENTIAL/PLATELET
Absolute Monocytes: 470 cells/uL (ref 200–950)
Basophils Absolute: 39 cells/uL (ref 0–200)
Basophils Relative: 0.7 %
Eosinophils Absolute: 39 cells/uL (ref 15–500)
Eosinophils Relative: 0.7 %
HCT: 27.1 % — ABNORMAL LOW (ref 38.5–50.0)
Hemoglobin: 9.1 g/dL — ABNORMAL LOW (ref 13.2–17.1)
Lymphs Abs: 2033 cells/uL (ref 850–3900)
MCH: 31.3 pg (ref 27.0–33.0)
MCHC: 33.6 g/dL (ref 32.0–36.0)
MCV: 93.1 fL (ref 80.0–100.0)
MPV: 11.1 fL (ref 7.5–12.5)
Monocytes Relative: 8.4 %
Neutro Abs: 3018 cells/uL (ref 1500–7800)
Neutrophils Relative %: 53.9 %
Platelets: 188 10*3/uL (ref 140–400)
RBC: 2.91 10*6/uL — ABNORMAL LOW (ref 4.20–5.80)
RDW: 17.7 % — ABNORMAL HIGH (ref 11.0–15.0)
Total Lymphocyte: 36.3 %
WBC: 5.6 10*3/uL (ref 3.8–10.8)

## 2022-03-09 LAB — HCV RNA,QUANTITATIVE REAL TIME PCR
HCV Quantitative Log: <1.18 Log IU/mL
HCV RNA, PCR, QN: <15 IU/mL

## 2022-03-09 LAB — URIC ACID: Uric Acid, Serum: 10.3 mg/dL — ABNORMAL HIGH (ref 4.0–8.0)

## 2022-03-09 LAB — RHEUMATOID FACTOR: Rheumatoid fact SerPl-aCnc: <14 IU/mL (ref <14)

## 2022-03-09 LAB — SEDIMENTATION RATE: Sed Rate: 108 mm/h — ABNORMAL HIGH (ref 0–20)

## 2022-03-09 LAB — HEPATITIS B SURFACE ANTIGEN: Hepatitis B Surface Ag: NONREACTIVE

## 2022-03-09 LAB — CYCLIC CITRUL PEPTIDE ANTIBODY, IGG: Cyclic Citrullin Peptide Ab: 16 UNITS

## 2022-03-09 LAB — HEPATITIS B CORE ANTIBODY, IGM: Hep B C IgM: NONREACTIVE

## 2022-03-10 NOTE — Progress Notes (Signed)
We will discuss results at the follow up appointment.

## 2022-03-17 ENCOUNTER — Encounter: Payer: Self-pay | Admitting: Family Medicine

## 2022-03-17 ENCOUNTER — Ambulatory Visit: Payer: Medicaid Other | Attending: Critical Care Medicine | Admitting: Family Medicine

## 2022-03-17 ENCOUNTER — Ambulatory Visit: Payer: Medicaid Other | Admitting: Critical Care Medicine

## 2022-03-17 VITALS — BP 146/83 | HR 106 | Temp 98.5°F | Ht 67.0 in | Wt 163.0 lb

## 2022-03-17 DIAGNOSIS — Z23 Encounter for immunization: Secondary | ICD-10-CM | POA: Diagnosis not present

## 2022-03-17 DIAGNOSIS — R748 Abnormal levels of other serum enzymes: Secondary | ICD-10-CM | POA: Diagnosis not present

## 2022-03-17 DIAGNOSIS — I10 Essential (primary) hypertension: Secondary | ICD-10-CM

## 2022-03-17 DIAGNOSIS — K5909 Other constipation: Secondary | ICD-10-CM

## 2022-03-17 MED ORDER — POLYETHYLENE GLYCOL 3350 17 GM/SCOOP PO POWD: 17.0000 g | Freq: Every day | ORAL | 1 refills | Status: DC

## 2022-03-17 NOTE — Progress Notes (Signed)
Subjective:  Patient ID: Brandon Arias, male    DOB: 1970/07/04  Age: 51 y.o. MRN: [id number]  CC: Hypertension   HPI Brandon Arias is a 51 y.o. year old male patient of Dr Joya Gaskins with a history of hypertension, gout, lumbar radiculopathy seen for an acute visit.  Interval History:  He Complains of constipation with bowel movements which can occur every 2 days with passage of hard stool, associated straining. He does not use any medications for relief.  Denies presence of hematochezia, abdominal pain, nausea or vomiting.  His last set of labs revealed elevated AST of 132 which are slightly improved compared to 166, seven months prior (at which time alkaline phosphatase was elevated at 184 and ALT at 62) but his Bilirubin is 3.0 up from 0.8 previously. He denies alcohol consumption as he states he quit 1 year ago.  He has noticed some jaundice.  His hepatitis C is reactive but quant is negative, hep B is negative. Past Medical History:  Diagnosis Date   Gout 12/30/2017    Past Surgical History:  Procedure Laterality Date   no known surgeries      History reviewed. No pertinent family history.  Social History   Socioeconomic History   Marital status: Married    Spouse name: Not on file   Number of children: Not on file   Years of education: Not on file   Highest education level: Not on file  Occupational History   Not on file  Tobacco Use   Smoking status: Never    Passive exposure: Never   Smokeless tobacco: Never  Vaping Use   Vaping Use: Never used  Substance and Sexual Activity   Alcohol use: Not Currently   Drug use: Never   Sexual activity: Not Currently  Other Topics Concern   Not on file  Social History Narrative   Not on file   Social Determinants of Health   Financial Resource Strain: Not on file  Food Insecurity: Not on file  Transportation Needs: Not on file  Physical Activity: Not on file  Stress: Not on file  Social Connections: Not on file     No Known Allergies  Outpatient Medications Prior to Visit  Medication Sig Dispense Refill   amLODipine (NORVASC) 10 MG tablet Take 1 tablet (10 mg total) by mouth daily. 90 tablet 1   colchicine 0.6 MG tablet Take 1 tablet by mouth daily and increase to 2 tablets by mouth daily for a gout flare. 60 tablet 2   allopurinol (ZYLOPRIM) 100 MG tablet Take 1 tablet (100 mg total) by mouth 2 (two) times daily. (Patient not taking: Reported on 03/17/2022) 60 tablet 2   cyclobenzaprine (FLEXERIL) 10 MG tablet Take 1 tablet (10 mg total) by mouth 3 (three) times daily as needed for muscle spasms. (Patient not taking: Reported on 03/06/2022) 90 tablet 2   traMADol (ULTRAM) 50 MG tablet Take 1 tablet (50 mg total) by mouth every 6 (six) hours as needed. (Patient not taking: Reported on 03/06/2022) 30 tablet 0   No facility-administered medications prior to visit.     ROS Review of Systems  Constitutional:  Negative for activity change and appetite change.  HENT:  Negative for sinus pressure and sore throat.   Respiratory:  Negative for chest tightness, shortness of breath and wheezing.   Cardiovascular:  Negative for chest pain and palpitations.  Gastrointestinal:  Positive for constipation. Negative for abdominal distention and abdominal pain.  Genitourinary: Negative.   Musculoskeletal:  Negative.   Psychiatric/Behavioral:  Negative for behavioral problems and dysphoric mood.     Objective:  BP (!) 146/83   Pulse (!) 106   Temp 98.5 F (36.9 C) (Oral)   Ht _0  (1.702 m)   Wt 163 lb (73.9 kg)   SpO2 100%   BMI 25.53 kg/m      03/17/2022   10:10 AM 03/06/2022   10:42 AM 03/06/2022   10:24 AM  BP/Weight  Systolic BP 433 295 188  Diastolic BP 83 94 84  Wt. (Lbs) 163  164.6  BMI 25.53 kg/m2  25.78 kg/m2      Physical Exam Constitutional:      Appearance: He is well-developed.  Eyes:     General: Scleral icterus present.  Cardiovascular:     Rate and Rhythm:  Tachycardia present.     Heart sounds: Normal heart sounds. No murmur heard. Pulmonary:     Effort: Pulmonary effort is normal.     Breath sounds: Normal breath sounds. No wheezing or rales.  Chest:     Chest wall: No tenderness.  Abdominal:     General: Bowel sounds are normal. There is no distension.     Palpations: Abdomen is soft. There is no mass.     Tenderness: There is no abdominal tenderness.  Musculoskeletal:        General: Normal range of motion.     Right lower leg: No edema.     Left lower leg: No edema.     Comments: Tophi on elbows  Neurological:     Mental Status: He is alert and oriented to person, place, and time.  Psychiatric:        Mood and Affect: Mood normal.        Latest Ref Rng & Units 03/06/2022   11:16 AM 08/14/2021   10:22 AM 06/02/2021   10:42 AM  CMP  Glucose 65 - 99 mg/dL 104   71   BUN 7 - 25 mg/dL 9   14   Creatinine 0.70 - 1.30 mg/dL 0.80   0.71   Sodium 135 - 146 mmol/L 135   147   Potassium 3.5 - 5.3 mmol/L 3.5   4.6   Chloride 98 - 110 mmol/L 101   108   CO2 20 - 32 mmol/L 22   21   Calcium 8.6 - 10.3 mg/dL 8.0   8.5   Total Protein 6.1 - 8.1 g/dL 9.4  8.4  8.6   Total Bilirubin 0.2 - 1.2 mg/dL 3.0  0.8  0.4   Alkaline Phos 44 - 121 IU/L  184  198   AST 10 - 35 U/L 132  166  82   ALT 9 - 46 U/L 45  62  39     Lipid Panel     Component Value Date/Time   CHOL 78 (L) 12/31/2020 1030   TRIG 58 12/31/2020 1030   HDL 30 (L) 12/31/2020 1030   CHOLHDL 2.6 12/31/2020 1030   LDLCALC 34 12/31/2020 1030    CBC    Component Value Date/Time   WBC 5.6 03/06/2022 1116   RBC 2.91 (L) 03/06/2022 1116   HGB 9.1 (L) 03/06/2022 1116   HGB 10.1 (L) 06/02/2021 1042   HCT 27.1 (L) 03/06/2022 1116   HCT 32.8 (L) 06/02/2021 1042   PLT 188 03/06/2022 1116   PLT 184 06/02/2021 1042   MCV 93.1 03/06/2022 1116   MCV 87 06/02/2021 1042   MCH 31.3 03/06/2022  1116   MCHC 33.6 03/06/2022 1116   RDW 17.7 (H) 03/06/2022 1116   RDW 19.7 (H)  06/02/2021 1042   LYMPHSABS 2,033 03/06/2022 1116   LYMPHSABS 2.9 06/02/2021 1042   MONOABS 0.7 01/31/2021 1332   EOSABS 39 03/06/2022 1116   EOSABS 0.0 06/02/2021 1042   BASOSABS 39 03/06/2022 1116   BASOSABS 0.1 06/02/2021 1042    No results found for: "HGBA1C"  Assessment & Plan:  1. Other constipation Counseled on increasing fiber intake, fruits and vegetable, limit intake of foods like cheese, white bread, white rice  - polyethylene glycol powder (GLYCOLAX/MIRALAX) 17 GM/SCOOP powder; Take 17 g by mouth daily.  Dispense: 3350 g; Refill: 1  2. Elevated liver enzymes He is icteric We will check liver enzymes again today He denies alcohol consumption - US Abdomen Limited RUQ (LIVER/GB); Future - CMP14+EGFR - Gamma GT - Hepatitis A Antibody, IGM  3. Need for immunization against influenza - Flu Vaccine QUAD 74moIM (Fluarix, Fluzone & Alfiuria Quad PF)  4. Essential hypertension Slightly above goal No regimen change and we will have him follow-up to reassess his blood pressure at his next visit after working on lifestyle modifications Continue Amlodipine Counseled on blood pressure goal of less than 130/80, low-sodium, DASH diet, medication compliance, 150 minutes of moderate intensity exercise per week. Discussed medication compliance, adverse effects.    Meds ordered this encounter  Medications   polyethylene glycol powder (GLYCOLAX/MIRALAX) 17 GM/SCOOP powder    Sig: Take 17 g by mouth daily.    Dispense:  3350 g    Refill:  1    Follow-up: Return in about 1 month (around 04/16/2022) for Elevated liver enzyme with PCP..Marland Kitchen      ECharlott Rakes MD, FAAFP. [facility] [place]  03/17/2022, 12:46 PM

## 2022-03-17 NOTE — Patient Instructions (Signed)
Constipation chez l'adulte Constipation, Adult On parle de constipation lorsqu'une personne va  la selle moins de trois fois par semaine, a du mal  aller  la selle ou a des selles (fces) sches, dures ou plus grosses que la normale. La constipation peut tre cause par une affection sous-jacente. Elle peut s'aggraver avec l'ge si une personne prend certains mdicaments et ne consomme pas assez de liquides. Suivez les instructions suivantes  domicile : Alimentation et boissons  Consommez des aliments contenant beaucoup de fibres, notamment des haricots secs, des crales compltes, des fruits et des lgumes frais. Limitez votre consommation en aliments pauvres en fibres et riches en graisses et en sucres transforms, par exemple les aliments frits ou sucrs. Il s'agit notamment des frites, des hamburgers, des biscuits, des bonbons et des boissons gazeuses. Buvez des quantits suffisantes de liquides pour garder votre urine jaune ple. Instructions gnrales Faites rgulirement de l'exercice ou conformment aux consignes de votre prestataire de soins de sant. Tobi Bastos de pratiquer 150 minutes d'exercice physique modr chaque semaine. Allez aux toilettes ds que vous en prouvez le besoin. Ne retenez pas vos selles. Prenez vos mdicaments en vente libre et sur ordonnance en suivant scrupuleusement les instructions de votre prestataire de soins de sant. Y compris les supplments de fibres. Lorsque vous allez  la selle : Pratiquez la respiration profonde Public relations account executive bas de votre abdomen. Relchez votre plancher pelvien. Surveillez vos symptmes pour Risk manager. Informez-en votre prestataire de soins de sant. Rendez-vous  toutes vos visites de suivi prvues par [employer] de soins de sant. C'est important. Prenez Sales promotion account executive de soins de sant si : Financial controller. Vous avez de la fivre. Vous n'tes pas all(e)  la selle depuis 4  jours. Vous vomissez. Vous n'avez pas faim ou si vous perdez [employer]. Vous saignez  l'endroit de [employer] fesses (anus). Vos selles sont minces, de la Teacher, music. Demandez immdiatement de l'aide si : Vous avez de la fivre et vos symptmes s'aggravent soudainement. Vous souffrez d'une incontinence fcale ou vos selles contiennent du sang. Votre ventre est ballonn. Vous ressentez des [employer] niveau de l'abdomen. Vous vous sentez tourdi(e) ou vous vous vanouissez. Rsum On parle de constipation lorsqu'une personne va  la selle moins de trois fois par [employer], a du mal  aller  la selle ou a des selles (fces) sches, dures ou plus grosses que la normale. Consommez des aliments contenant beaucoup de fibres, notamment des haricots secs, des crales compltes, des fruits et des lgumes frais. Buvez des quantits suffisantes de liquides pour garder votre urine jaune ple. Prenez vos mdicaments en vente libre et sur ordonnance en suivant scrupuleusement les instructions de votre prestataire de soins de sant. Y compris les supplments de fibres. Ces conseils et renseignements ne sauraient se substituer  l'avis mdical de votre prestataire de soins de sant. Par consquent, il est primordial de parler de toutes vos proccupations avec votre prestataire de soins de sant. Document Revised: 05/17/2019 Document Reviewed: 05/17/2019 Elsevier Patient Education  2023 [employer].

## 2022-03-17 NOTE — Progress Notes (Signed)
Having trouble with using the bathroom.

## 2022-03-18 LAB — CMP14+EGFR
ALT: 56 IU/L — ABNORMAL HIGH (ref 0–44)
AST: 213 IU/L — ABNORMAL HIGH (ref 0–40)
Albumin/Globulin Ratio: 0.5 — ABNORMAL LOW (ref 1.2–2.2)
Albumin: 2.9 g/dL — ABNORMAL LOW (ref 3.8–4.9)
Alkaline Phosphatase: 193 IU/L — ABNORMAL HIGH (ref 44–121)
BUN/Creatinine Ratio: 8 — ABNORMAL LOW (ref 9–20)
BUN: 6 mg/dL (ref 6–24)
Bilirubin Total: 1.3 mg/dL — ABNORMAL HIGH (ref 0.0–1.2)
CO2: 17 mmol/L — ABNORMAL LOW (ref 20–29)
Calcium: 8 mg/dL — ABNORMAL LOW (ref 8.7–10.2)
Chloride: 104 mmol/L (ref 96–106)
Creatinine, Ser: 0.76 mg/dL (ref 0.76–1.27)
Globulin, Total: 5.7 g/dL — ABNORMAL HIGH (ref 1.5–4.5)
Glucose: 134 mg/dL — ABNORMAL HIGH (ref 70–99)
Potassium: 3.4 mmol/L — ABNORMAL LOW (ref 3.5–5.2)
Sodium: 139 mmol/L (ref 134–144)
Total Protein: 8.6 g/dL — ABNORMAL HIGH (ref 6.0–8.5)
eGFR: 109 mL/min/{1.73_m2} (ref >59)

## 2022-03-18 LAB — GAMMA GT: GGT: 305 IU/L — ABNORMAL HIGH (ref 0–65)

## 2022-03-19 LAB — SPECIMEN STATUS REPORT

## 2022-03-19 LAB — HEPATITIS A ANTIBODY, IGM: Hep A IgM: NEGATIVE

## 2022-03-20 ENCOUNTER — Other Ambulatory Visit: Payer: Self-pay | Admitting: Family Medicine

## 2022-03-20 MED ORDER — POTASSIUM CHLORIDE CRYS ER 10 MEQ PO TBCR: 10.0000 meq | EXTENDED_RELEASE_TABLET | Freq: Two times a day (BID) | ORAL | 1 refills | Status: DC

## 2022-03-21 ENCOUNTER — Encounter: Payer: Self-pay | Admitting: *Deleted

## 2022-03-21 NOTE — Congregational Nurse Program (Signed)
  Dept: [id number]   Congregational Nurse Program Note  Date of Encounter: 03/17/2022  Past Medical History: Past Medical History:  Diagnosis Date   Gout 12/30/2017    Encounter Details:  CNP Questionnaire - 03/17/22 1010       Questionnaire   Ask client: Do you give verbal consent for me to treat you today? Yes    Student Assistance N/A    Location Patient Served  [facility]    Visit Setting with Client Home;MD Office;Pharmacy    Patient Status Migrant    Insurance Medicaid    Insurance/Financial Assistance Referral N/A    Medication Have Medication Insecurities    Medical Provider Yes    Screening Referrals Made N/A    Medical Referrals Made N/A    Medical Appointment Made [facility] P[facility]/clinic    Recently w/o PCP, now 1st time PCP visit completed due to CNs referral or appointment made N/A    Food Have Food Insecurities    Transportation Need transportation assistance;Provided transportation assistance    Housing/Utilities No permanent housing    Interpersonal Safety N/A    Interventions Navigate Healthcare System;Advocate/Support;Counsel;Educate    Abnormal to Normal Screening Since Last CN Visit N/A    Screenings CN Performed N/A    Sent Client to Lab for: N/A    Did client attend any of the following based off CNs referral or appointments made? N/A    ED Visit Averted Yes    Life-Saving Intervention Made N/A            Accompanied client to MD appointment at [facility].  Accompanied client to pharmacy for medication pickup and accompanied client to his friend's home.  Client says he has a fever today.  Client was shaking despite heat turned on and up in our car.  Client given extra coat for warmth.  Explained MD instructions to client for better understanding.  Will follow up with client as needed.  Roderic Palau, RN, MSN, CNP [phone] Office [phone] Cell

## 2022-03-30 ENCOUNTER — Other Ambulatory Visit: Payer: Medicaid Other

## 2022-04-01 NOTE — Progress Notes (Deleted)
Office Visit Note  Patient: Brandon Arias             Date of Birth: Jun 26, 1970           MRN: [id number]             PCP: Elsie Stain, MD Referring: Elsie Stain, MD Visit Date: 04/15/2022 Occupation: _0 @  Subjective:  No chief complaint on file.   History of Present Illness: Brandon Arias is a 51 y.o. male ***   Activities of Daily Living:  Patient reports morning stiffness for *** {minute/hour:19697}.   Patient {ACTIONS;DENIES/REPORTS:21021675::"Denies"} nocturnal pain.  Difficulty dressing/grooming: {ACTIONS;DENIES/REPORTS:21021675::"Denies"} Difficulty climbing stairs: {ACTIONS;DENIES/REPORTS:21021675::"Denies"} Difficulty getting out of chair: {ACTIONS;DENIES/REPORTS:21021675::"Denies"} Difficulty using hands for taps, buttons, cutlery, and/or writing: {ACTIONS;DENIES/REPORTS:21021675::"Denies"}  No Rheumatology ROS completed.   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 Past Surgical History:  Procedure Laterality Date   no known surgeries     Social History   Social History Narrative   Not on file   Immunization History  Administered Date(s) Administered   Influenza,inj,Quad PF,6+ Mos 05/23/2019, 03/17/2022   Tdap 09/23/2020     Objective: Vital Signs: There were no vitals taken for this visit.   Physical Exam   Musculoskeletal Exam: ***  CDAI Exam: CDAI Score: -- Patient Global: --; Provider Global: -- Swollen: --; Tender: -- Joint Exam 04/15/2022   No joint exam has been documented for this visit   There is currently no information documented on the homunculus. Go to the  Rheumatology activity and complete the homunculus joint exam.  Investigation: No additional findings.  Imaging: XR Foot 2 Views Left  Result Date: 03/06/2022 Severe first MTP narrowing was noted.  PIP and DIP narrowing was noted.  Soft tissue swelling was noted over the metatarsals.  Dorsal spurring was noted.  No tibiotalar or subtalar joint space narrowing was noted. 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  XR Foot 2 Views Right  Result Date: 03/06/2022 First MTP, PIP and DIP narrowing was noted.  No intertarsal, tibiotalar or subtalar joint space narrowing was noted.  Possible erosion was noted over the head of fifth metatarsal. 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  XR KNEE 3 VIEW LEFT  Result Date: 03/06/2022 No medial or lateral compartment narrowing was noted.  No patellofemoral narrowing was noted.  No chondrocalcinosis was noted. Impression: Unremarkable x-ray of the knee joint.  XR KNEE 3 VIEW RIGHT  Result Date: 03/06/2022 No medial or lateral compartment narrowing was noted.  No patellofemoral narrowing was noted.  No chondrocalcinosis was noted. Impression: Unremarkable x-ray of the knee joint.  XR Hand 2 View Left  Result Date: 03/06/2022 CMC, PIP and DIP narrowing was noted.  No MCP narrowing was noted.  Posttraumatic changes were noted in the fifth metacarpal.  Intercarpal and radiocarpal joint space narrowing was noted.  Tophus was noted on the fifth PIP joint.  Erosive changes were noted in the fifth DIP joint.  Erosive changes were noted in in the ulnar styloid. 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  XR Hand 2 View  Right  Result Date: 03/06/2022 PIP narrowing was noted.  No DIP narrowing was noted.  Possible early erosion was noted over the third metatarsal head.  Intercarpal and radiocarpal joint space narrowing was  noted.  Possible erosive changes were noted in the ulnar styloid. 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   Recent Labs: Lab Results  Component Value Date   WBC 5.6 03/06/2022   HGB 9.1 (L) 03/06/2022   PLT 188 03/06/2022   NA 139 03/17/2022   K 3.4 (L) 03/17/2022   CL 104 03/17/2022   CO2 17 (L) 03/17/2022   GLUCOSE 134 (H) 03/17/2022   BUN 6 03/17/2022   CREATININE 0.76 03/17/2022   BILITOT 1.3 (H) 03/17/2022   ALKPHOS 193 (H) 03/17/2022   AST 213 (H) 03/17/2022   ALT 56 (H) 03/17/2022   PROT 8.6 (H) 03/17/2022   ALBUMIN 2.9 (L) 03/17/2022   CALCIUM 8.0 (L) 03/17/2022   GFRAA 123 09/28/2019   QFTBGOLDPLUS Negative 08/14/2021   March 06, 2022 ESR 108, uric acid 10.3, RF negative, anti-CCP negative, hepatitis A negative, hepatitis B-, hepatitis C reactive, hepatitis C RNA quant negative   Speciality Comments: No specialty comments available.  Procedures:  No procedures performed Allergies: Patient has no known allergies.   Assessment / Plan:     Visit Diagnoses: No diagnosis found.  Orders: No orders of the defined types were placed in this encounter.  No orders of the defined types were placed in this encounter.   Face-to-face time spent with patient was *** minutes. Greater than 50% of time was spent in counseling and coordination of care.  Follow-Up Instructions: No follow-ups on file.   Bo Merino, MD  Note - This record has been created using Editor, commissioning.  Chart creation errors have been sought, but may not always  have been located. Such creation errors do not reflect on  the standard of medical care.

## 2022-04-15 ENCOUNTER — Ambulatory Visit: Payer: Medicaid Other | Attending: Rheumatology | Admitting: Rheumatology

## 2022-04-15 ENCOUNTER — Telehealth: Payer: Self-pay | Admitting: Rheumatology

## 2022-04-15 DIAGNOSIS — I10 Essential (primary) hypertension: Secondary | ICD-10-CM

## 2022-04-15 DIAGNOSIS — R7989 Other specified abnormal findings of blood chemistry: Secondary | ICD-10-CM

## 2022-04-15 DIAGNOSIS — M79641 Pain in right hand: Secondary | ICD-10-CM

## 2022-04-15 DIAGNOSIS — M1A9XX1 Chronic gout, unspecified, with tophus (tophi): Secondary | ICD-10-CM

## 2022-04-15 DIAGNOSIS — G8929 Other chronic pain: Secondary | ICD-10-CM

## 2022-04-15 DIAGNOSIS — M79671 Pain in right foot: Secondary | ICD-10-CM

## 2022-04-15 DIAGNOSIS — Z5181 Encounter for therapeutic drug level monitoring: Secondary | ICD-10-CM

## 2022-04-15 DIAGNOSIS — Z789 Other specified health status: Secondary | ICD-10-CM

## 2022-04-15 DIAGNOSIS — M5416 Radiculopathy, lumbar region: Secondary | ICD-10-CM

## 2022-04-15 NOTE — Telephone Encounter (Signed)
Dr. Delford Field,  Edourd did not keep his appointment despite several reminders.  His uric acid was elevated at 11.8 and he was not taking allopurinol.  All of his other autoimmune workup was negative.  I noticed that you started him on allopurinol 100 mg p.o. daily.  My recommendations will be to increase his allopurinol gradually.  I usually increase allopurinol dose by 100 mg every week until the patient reaches allopurinol 300 mg p.o. daily.  You may check his labs in a month after increased dose of allopurinol which would include CBC with differential, CMP with GFR, uric acid.  The goal is to keep uric acid below 6.0.  I usually recommend checking labs every 6 months.  Please let me know if I could be of any further help.  Thank you,  Pollyann Savoy, MD

## 2022-04-16 NOTE — Telephone Encounter (Signed)
Will retain information from Dr Corliss Skains for future use

## 2022-04-20 NOTE — Progress Notes (Deleted)
Established Patient Office Visit  Subjective:  Patient ID: Brandon Arias, male    DOB: 1970/07/04  Age: 51 y.o. MRN: [id number]  CC:  Primary care follow-up  HPI  05/2021 Brandon Arias presents for evaluation of right lower back pain which the patient subscribes to being his kidney.  This visit was assisted with [place] interpreter Brandon Arias [phone] on video  Patient originally is from [place] and was brought to the clinic under the guidance of the [facility] nurse program.  The patient has had 3 COVID vaccinations on file.  Patient has a history of tophaceous gout involving both elbows for which she has had multiple encounters in urgent care emergency room and other providers.  The patient is due colon cancer screening.  He does now have Medicaid.  Patient does drink alcohol with various parties and birthday events on weekends.  He has been in the emergency room for this previously with elevated liver function tests.  He now complains of difficulty with balance and carrying objects he is not able to work.  He has severe pain in the lower back which results in difficulty standing and sitting.  He also has gout flares in the elbows and states the indomethacin has been given and the low-dose allopurinol has not been of any benefit.  He does have a very high uric acid level but no renal insufficiency.  He also is requesting some help in his home environment.  He has been on prednisone Indocin and Allender Purinol along with methocarbamol without much relief at this time.  Patient has no other complaints.  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  The patient has since last being seen here has been evaluated by spinal surgeon who is planning spinal surgery in the next 2 months.  He is yet to get him to rheumatology because of his tophaceous gout Patient still has elbow pain.  He also did not pick up his allopurinol.  He has been using the meloxicam.  On  arrival blood pressure is good 130/85.  He has been using meloxicam and the Flexeril. The patient has stopped drinking alcohol.  He is less anxious at this visit compared to prior visits.  7/17 This patient is seen in return visit and visit was assisted with video [place] interpreter Brandon Arias [phone]  On arrival blood pressure elevated 150/90.  Patient still has chronic back pain was supposed of had surgery but has been postponed till November.  Patient was positive seeing rheumatology for his tophaceous gout however he did not respond when attempts to make appointment was made.  I will need to get his nurse case manager to assist in this in the [place] nurse program.  Patient needs a letter stating he cannot work due to his back pain.  We did do a TB screen at the last visit it was negative.  12/26 Saw Brandon Arias in November . Other constipation Counseled on increasing fiber intake, fruits and vegetable, limit intake of foods like cheese, white bread, white rice   - polyethylene glycol powder (GLYCOLAX/MIRALAX) 17 GM/SCOOP powder; Take 17 g by mouth daily.  Dispense: 3350 g; Refill: 1   2. Elevated liver enzymes He is icteric We will check liver enzymes again today He denies alcohol consumption - US Abdomen Limited RUQ (LIVER/GB); Future - CMP14+EGFR - Gamma GT - Hepatitis A Antibody, IGM   3. Need for immunization against influenza - Flu Vaccine QUAD 10moIM (Fluarix, Fluzone & Alfiuria Quad PF)  4. Essential hypertension Slightly above goal No regimen change and we will have him follow-up to reassess his blood pressure at his next visit after working on lifestyle modifications Continue Amlodipine Counseled on blood pressure goal of less than 130/80, low-sodium, DASH diet, medication compliance, 150 minutes of moderate intensity exercise per week. Discussed medication compliance, adverse effects.           Meds ordered this encounter  Medications   polyethylene glycol  powder (GLYCOLAX/MIRALAX) 17 GM/SCOOP powder      Sig: Take 17 g by mouth daily.      Dispense:  3350 g      Refill:  1    Per Rheumatology re Gout: Dr. Joya Gaskins,   Brandon Arias did not keep his appointment despite several reminders.  His uric acid was elevated at 11.8 and he was not taking allopurinol.  All of his other autoimmune workup was negative.  I noticed that you started him on allopurinol 100 mg p.o. daily.  My recommendations will be to increase his allopurinol gradually.  I usually increase allopurinol dose by 100 mg every week until the patient reaches allopurinol 300 mg p.o. daily.  You may check his labs in a month after increased dose of allopurinol which would include CBC with differential, CMP with GFR, uric acid.  The goal is to keep uric acid below 6.0.  I usually recommend checking labs every 6 months.  Please let me know if I could be of any further help.        Past Medical History:  Diagnosis Date   Gout 12/30/2017    Past Surgical History:  Procedure Laterality Date   no known surgeries      No family history on file.  Social History   Socioeconomic History   Marital status: Married    Spouse name: Not on file   Number of children: Not on file   Years of education: Not on file   Highest education level: Not on file  Occupational History   Not on file  Tobacco Use   Smoking status: Never    Passive exposure: Never   Smokeless tobacco: Never  Vaping Use   Vaping Use: Never used  Substance and Sexual Activity   Alcohol use: Not Currently   Drug use: Never   Sexual activity: Not Currently  Other Topics Concern   Not on file  Social History Narrative   Not on file   Social Determinants of Health   Financial Resource Strain: Not on file  Food Insecurity: Not on file  Transportation Needs: Not on file  Physical Activity: Not on file  Stress: Not on file  Social Connections: Not on file  Intimate Partner Violence: Not on file    Outpatient  Medications Prior to Visit  Medication Sig Dispense Refill   allopurinol (ZYLOPRIM) 100 MG tablet Take 1 tablet (100 mg total) by mouth 2 (two) times daily. (Patient not taking: Reported on 03/17/2022) 60 tablet 2   amLODipine (NORVASC) 10 MG tablet Take 1 tablet (10 mg total) by mouth daily. 90 tablet 1   colchicine 0.6 MG tablet Take 1 tablet by mouth daily and increase to 2 tablets by mouth daily for a gout flare. 60 tablet 2   cyclobenzaprine (FLEXERIL) 10 MG tablet Take 1 tablet (10 mg total) by mouth 3 (three) times daily as needed for muscle spasms. (Patient not taking: Reported on 03/06/2022) 90 tablet 2   polyethylene glycol powder (GLYCOLAX/MIRALAX) 17 GM/SCOOP powder Take 17  g by mouth daily. 3350 g 1   potassium chloride (KLOR-CON M) 10 MEQ tablet Take 1 tablet (10 mEq total) by mouth 2 (two) times daily. 30 tablet 1   traMADol (ULTRAM) 50 MG tablet Take 1 tablet (50 mg total) by mouth every 6 (six) hours as needed. (Patient not taking: Reported on 03/06/2022) 30 tablet 0   No facility-administered medications prior to visit.    No Known Allergies  ROS Review of Systems  Constitutional:  Negative for chills, diaphoresis and fever.  HENT:  Negative for congestion, hearing loss, nosebleeds, sore throat and tinnitus.   Eyes:  Negative for photophobia and redness.  Respiratory:  Negative for cough, shortness of breath, wheezing and stridor.   Cardiovascular:  Negative for chest pain, palpitations and leg swelling.  Gastrointestinal:  Negative for abdominal pain, blood in stool, constipation, diarrhea, nausea and vomiting.  Endocrine: Negative for polydipsia.  Genitourinary:  Negative for dysuria, flank pain, frequency, hematuria and urgency.  Musculoskeletal:  Positive for arthralgias, back pain, gait problem and joint swelling. Negative for myalgias and neck pain.  Skin:  Negative for rash.  Allergic/Immunologic: Negative for environmental allergies.  Neurological:  Negative for  dizziness, tremors, seizures, weakness and headaches.  Hematological:  Does not bruise/bleed easily.  Psychiatric/Behavioral:  Negative for dysphoric mood and suicidal ideas. The patient is not nervous/anxious.       Objective:    Physical Exam Vitals reviewed.  Constitutional:      General: He is not in acute distress.    Appearance: Normal appearance. He is well-developed. He is not ill-appearing, toxic-appearing or diaphoretic.     Comments: Significant push of speech somewhat angry at times restless  HENT:     Head: Normocephalic and atraumatic.     Nose: Nose normal. No nasal deformity, septal deviation, mucosal edema or rhinorrhea.     Right Sinus: No maxillary sinus tenderness or frontal sinus tenderness.     Left Sinus: No maxillary sinus tenderness or frontal sinus tenderness.     Mouth/Throat:     Mouth: Mucous membranes are moist.     Pharynx: Oropharynx is clear. No oropharyngeal exudate.     Comments: Mild dental caries Eyes:     General: No scleral icterus.    Conjunctiva/sclera: Conjunctivae normal.     Pupils: Pupils are equal, round, and reactive to light.  Neck:     Thyroid: No thyromegaly.     Vascular: No carotid bruit or JVD.     Trachea: Trachea normal. No tracheal tenderness or tracheal deviation.  Cardiovascular:     Rate and Rhythm: Normal rate and regular rhythm.     Chest Wall: PMI is not displaced.     Pulses: Normal pulses. No decreased pulses.     Heart sounds: Normal heart sounds, S1 normal and S2 normal. Heart sounds not distant. No murmur heard.    No systolic murmur is present.     No diastolic murmur is present.     No friction rub. No gallop. No S3 or S4 sounds.  Pulmonary:     Effort: No tachypnea, accessory muscle usage or respiratory distress.     Breath sounds: No stridor. No decreased breath sounds, wheezing, rhonchi or rales.  Chest:     Chest wall: No tenderness.  Abdominal:     General: Bowel sounds are normal. There is no  distension.     Palpations: Abdomen is soft. Abdomen is not rigid.     Tenderness: There is no  abdominal tenderness. There is no guarding or rebound.  Musculoskeletal:        General: Tenderness present. No swelling or deformity. Normal range of motion.     Cervical back: Normal range of motion and neck supple. No edema, erythema or rigidity. No muscular tenderness. Normal range of motion.     Right lower leg: No edema.     Left lower leg: No edema.     Comments: Point tenderness in the left lower back with reproducible pain.  The patient is neurologically intact in the lower extremities.  Lymphadenopathy:     Head:     Right side of head: No submental or submandibular adenopathy.     Left side of head: No submental or submandibular adenopathy.     Cervical: No cervical adenopathy.  Skin:    General: Skin is warm and dry.     Coloration: Skin is not pale.     Findings: No rash.     Nails: There is no clubbing.  Neurological:     General: No focal deficit present.     Mental Status: He is alert and oriented to person, place, and time. Mental status is at baseline.     Cranial Nerves: No cranial nerve deficit.     Motor: No weakness.     Coordination: Coordination normal.     Gait: Gait normal.     Deep Tendon Reflexes: Reflexes normal.     Comments: Patient has extreme difficulty arising from a sitting position in the chair.  Psychiatric:        Mood and Affect: Mood normal.        Speech: Speech normal.        Behavior: Behavior normal.        Thought Content: Thought content normal.        Judgment: Judgment normal.     There were no vitals taken for this visit. Wt Readings from Last 3 Encounters:  03/17/22 163 lb (73.9 kg)  03/06/22 164 lb 9.6 oz (74.7 kg)  11/10/21 167 lb 12.8 oz (76.1 kg)     Health Maintenance Due  Topic Date Due   COVID-19 Vaccine (1) Never done   COLON CANCER SCREENING ANNUAL FOBT  Never done    There are no preventive care reminders to  display for this patient.  Lab Results  Component Value Date   TSH 0.714 12/31/2020   Lab Results  Component Value Date   WBC 5.6 03/06/2022   HGB 9.1 (L) 03/06/2022   HCT 27.1 (L) 03/06/2022   MCV 93.1 03/06/2022   PLT 188 03/06/2022   Lab Results  Component Value Date   NA 139 03/17/2022   K 3.4 (L) 03/17/2022   CO2 17 (L) 03/17/2022   GLUCOSE 134 (H) 03/17/2022   BUN 6 03/17/2022   CREATININE 0.76 03/17/2022   BILITOT 1.3 (H) 03/17/2022   ALKPHOS 193 (H) 03/17/2022   AST 213 (H) 03/17/2022   ALT 56 (H) 03/17/2022   PROT 8.6 (H) 03/17/2022   ALBUMIN 2.9 (L) 03/17/2022   CALCIUM 8.0 (L) 03/17/2022   ANIONGAP 8 01/31/2021   EGFR 109 03/17/2022   Lab Results  Component Value Date   CHOL 78 (L) 12/31/2020   Lab Results  Component Value Date   HDL 30 (L) 12/31/2020   Lab Results  Component Value Date   LDLCALC 34 12/31/2020   Lab Results  Component Value Date   TRIG 58 12/31/2020   Lab Results  Component Value Date   CHOLHDL 2.6 12/31/2020   No results found for: "HGBA1C" 01/2021 MRI Lumbar: IMPRESSION: 1. At L4-5 there is a broad-based disc bulge. Mild bilateral facet arthropathy. Severe spinal stenosis. Mild bilateral foraminal stenosis. Bilateral subarticular recess stenosis. 2.  No acute osseous injury of the lumbar spine.   Assessment & Plan:   Problem List Items Addressed This Visit   None No orders of the defined types were placed in this encounter.  Follow-up: No follow-ups on file.    Asencion Noble, MD

## 2022-04-21 ENCOUNTER — Ambulatory Visit: Payer: Medicaid Other | Admitting: Critical Care Medicine

## 2022-04-24 ENCOUNTER — Encounter: Payer: Self-pay | Admitting: *Deleted

## 2022-04-28 NOTE — [facility] Nurse Program (Signed)
  Dept: [phone]   [facility]ational Nurse Program Note  Date of Encounter: 04/24/2022  Past Medical History: Past Medical History:  Diagnosis Date   Gout 12/30/2017    Encounter Details:  CNP Questionnaire - 04/24/22 1200       Questionnaire   Ask client: Do you give verbal consent for me to treat you today? Yes    Student Assistance N/A    Location Patient [place]    Visit Setting with Client Home;Phone/Text/Email    Patient Status Migrant    Insurance Medicaid    Insurance/Financial Assistance Referral N/A    Medication Have Medication Insecurities    Medical Provider Yes    Screening Referrals Made N/A    Medical Referrals Made N/A    Medical Appointment Made N/A    Recently w/o PCP, now 1st time PCP visit completed due to CNs referral or appointment made N/A    Food Have Food Insecurities    Transportation Need transportation assistance;Provided transportation assistance    Housing/Utilities No permanent housing    Interpersonal Safety N/A    Interventions Advocate/Support;Counsel;Educate    Abnormal to Normal Screening Since Last CN Visit N/A    Screenings CN Performed N/A    Sent Client to Lab for: N/A    Did client attend any of the following based off CNs referral or appointments made? N/A    ED Visit Averted Yes    Life-Saving Intervention Made N/A            Received call from Client that he needed help.  Reports that his family was hungry; he needed to renew his bus pass; he needed to pay for his phone; he needed to go to the grocery store.  Provided transportation to client so that he could complete all errands.  Encouraged client to continue to apply for a job since he has minimal financial resources.  He does have food stamps for his family.  Reports that his wife recently lost her job and he is trying to help his wife.  Client does not stay with his wife all the time.  Will follow up with client as requested.  Karene Fry, RN, MSN,  [place] Office [phone] Cell

## 2022-05-06 ENCOUNTER — Encounter: Payer: Self-pay | Admitting: Critical Care Medicine

## 2022-05-06 ENCOUNTER — Ambulatory Visit: Payer: Medicaid Other | Attending: Critical Care Medicine | Admitting: Critical Care Medicine

## 2022-05-06 VITALS — BP 142/90 | HR 96 | Ht 67.0 in | Wt 157.4 lb

## 2022-05-06 DIAGNOSIS — I10 Essential (primary) hypertension: Secondary | ICD-10-CM

## 2022-05-06 DIAGNOSIS — R7989 Other specified abnormal findings of blood chemistry: Secondary | ICD-10-CM

## 2022-05-06 DIAGNOSIS — M1A9XX1 Chronic gout, unspecified, with tophus (tophi): Secondary | ICD-10-CM

## 2022-05-06 DIAGNOSIS — M5416 Radiculopathy, lumbar region: Secondary | ICD-10-CM

## 2022-05-06 MED ORDER — COLCHICINE 0.6 MG PO TABS: ORAL_TABLET | ORAL | 2 refills | Status: DC

## 2022-05-06 MED ORDER — VALSARTAN 160 MG PO TABS: 160.0000 mg | ORAL_TABLET | Freq: Every day | ORAL | 3 refills | Status: DC

## 2022-05-06 MED ORDER — INDOMETHACIN 25 MG PO CAPS: 25.0000 mg | ORAL_CAPSULE | Freq: Three times a day (TID) | ORAL | 0 refills | Status: DC

## 2022-05-06 MED ORDER — AMLODIPINE BESYLATE 10 MG PO TABS: 10.0000 mg | ORAL_TABLET | Freq: Every day | ORAL | 1 refills | Status: DC

## 2022-05-06 NOTE — Assessment & Plan Note (Signed)
Referral back to orthopedic spine was made

## 2022-05-06 NOTE — Patient Instructions (Addendum)
Referral to orthopedics will be made for your back  Ultrasound of the liver will be obtained  Labs obtained today to check your liver function and blood counts  Take colchicine 2 pills daily for your gout flare  Stop allopurinol  Stop tramadol  Start Indocin/indomethacin 1 pill  3 times daily with food until bottle empty  Letter produced for your disability  Return to Dr. Joya Gaskins 2 months  Une rfrence en orthopdie sera faite pour [employer] chographie du foie sera obtenue  Laboratoires obtenus aujourd'hui pour vrifier votre fonction hpatique et votre formule sanguine  Prenez 2 comprims de colchicine par jour pour votre pousse de goutte  Arrter l'allopurinol  Arrtez le tramadol  Commencez Indocin/indomtacine 1 comprim 3 fois par jour avec de la nourriture jusqu' ce que le flacon soit vide.  Lettre produite pour votre handicap  Retour Health visitor Dr Joya Gaskins 2 mois

## 2022-05-06 NOTE — Assessment & Plan Note (Signed)
Tophaceous gout flare flare we will give indomethacin and increase dose of colchicine  Will have to stop allopurinol due to elevated liver function will follow-up liver function

## 2022-05-06 NOTE — Assessment & Plan Note (Signed)
Blood pressure not well-controlled will increase amlodipine to 10 mg daily and begin valsartan 160 mg daily

## 2022-05-06 NOTE — Assessment & Plan Note (Signed)
Due to allopurinol use will hold allopurinol and follow-up liver function

## 2022-05-06 NOTE — Progress Notes (Signed)
Established Patient Office Visit  Subjective:  Patient ID: Brandon Arias, male    DOB: 06-30-1970  Age: 52 y.o. MRN: [id number]  CC:  Primary care follow-up  HPI  05/2021 Brandon Arias presents for evaluation of right lower back pain which the patient subscribes to being his kidney.  This visit was assisted with [place] interpreter Eddie Dibbles [phone] on video  Patient originally is from [place] and was brought to the clinic under the guidance of the Congregational and community nurse program.  The patient has had 3 COVID vaccinations on file.  Patient has a history of tophaceous gout involving both elbows for which she has had multiple encounters in urgent care emergency room and other providers.  The patient is due colon cancer screening.  He does now have Medicaid.  Patient does drink alcohol with various parties and birthday events on weekends.  He has been in the emergency room for this previously with elevated liver function tests.  He now complains of difficulty with balance and carrying objects he is not able to work.  He has severe pain in the lower back which results in difficulty standing and sitting.  He also has gout flares in the elbows and states the indomethacin has been given and the low-dose allopurinol has not been of any benefit.  He does have a very high uric acid level but no renal insufficiency.  He also is requesting some help in his home environment.  He has been on prednisone Indocin and Allender Purinol along with methocarbamol without much relief at this time.  Patient has no other complaints.  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  The patient has since last being seen here has been evaluated by spinal surgeon who is planning spinal surgery in the next 2 months.  He is yet to get him to rheumatology because of his tophaceous gout Patient still has elbow pain.  He also did not pick up his allopurinol.  He has been using the meloxicam.  On  arrival blood pressure is good 130/85.  He has been using meloxicam and the Flexeril. The patient has stopped drinking alcohol.  He is less anxious at this visit compared to prior visits.  7/17 This patient is seen in return visit and visit was assisted with video [place] interpreter Christella [phone]  On arrival blood pressure elevated 150/90.  Patient still has chronic back pain was supposed of had surgery but has been postponed till November.  Patient was positive seeing rheumatology for his tophaceous gout however he did not respond when attempts to make appointment was made.  I will need to get his nurse case manager to assist in this in the [place] nurse program.  Patient needs a letter stating he cannot work due to his back pain.  We did do a TB screen at the last visit it was negative.  05/06/22 Patient seen in return follow-up I have not seen him since July 23 but he was seen in November by clinical pharmacy and my partner Dr. Margarita Rana.  He has had problems with abdominal complaints with constipation.  The patient also has continued low back pain and today comes in with severely swollen left elbow.  He has tophaceous gout but it appears he is having a gout flare in the olecranon bursa of the left elbow.  Note this visit was assisted by a [place] interpreter [place].  He had a liver ultrasound scheduled but has yet to complete this.  He has  had elevated liver enzymes and has been on allopurinol.  He has been followed by rheumatology but did not follow-up with him.  They started him on allopurinol with a titrating dose up they wanted him to come back for labs he did not return.  He was seen by primary care here in November had labs done showing elevated enzymes is still taking the allopurinol.  He also takes colchicine.   Past Medical History:  Diagnosis Date   Gout 12/30/2017    Past Surgical History:  Procedure Laterality Date   no known surgeries      History reviewed. No  pertinent family history.  Social History   Socioeconomic History   Marital status: Married    Spouse name: Not on file   Number of children: Not on file   Years of education: Not on file   Highest education level: Not on file  Occupational History   Not on file  Tobacco Use   Smoking status: Never    Passive exposure: Never   Smokeless tobacco: Never  Vaping Use   Vaping Use: Never used  Substance and Sexual Activity   Alcohol use: Not Currently   Drug use: Never   Sexual activity: Not Currently  Other Topics Concern   Not on file  Social History Narrative   Not on file   Social Determinants of Health   Financial Resource Strain: Not on file  Food Insecurity: Not on file  Transportation Needs: Not on file  Physical Activity: Not on file  Stress: Not on file  Social Connections: Not on file  Intimate Partner Violence: Not on file    Outpatient Medications Prior to Visit  Medication Sig Dispense Refill   allopurinol (ZYLOPRIM) 100 MG tablet Take 1 tablet (100 mg total) by mouth 2 (two) times daily. 60 tablet 2   amLODipine (NORVASC) 10 MG tablet Take 1 tablet (10 mg total) by mouth daily. 90 tablet 1   colchicine 0.6 MG tablet Take 1 tablet by mouth daily and increase to 2 tablets by mouth daily for a gout flare. 60 tablet 2   cyclobenzaprine (FLEXERIL) 10 MG tablet Take 1 tablet (10 mg total) by mouth 3 (three) times daily as needed for muscle spasms. (Patient not taking: Reported on 03/06/2022) 90 tablet 2   polyethylene glycol powder (GLYCOLAX/MIRALAX) 17 GM/SCOOP powder Take 17 g by mouth daily. (Patient not taking: Reported on 05/06/2022) 3350 g 1   potassium chloride (KLOR-CON M) 10 MEQ tablet Take 1 tablet (10 mEq total) by mouth 2 (two) times daily. (Patient not taking: Reported on 05/06/2022) 30 tablet 1   traMADol (ULTRAM) 50 MG tablet Take 1 tablet (50 mg total) by mouth every 6 (six) hours as needed. (Patient not taking: Reported on 03/06/2022) 30 tablet 0    No facility-administered medications prior to visit.    No Known Allergies  ROS Review of Systems  Constitutional:  Negative for chills, diaphoresis and fever.  HENT:  Negative for congestion, hearing loss, nosebleeds, sore throat and tinnitus.   Eyes:  Negative for photophobia and redness.  Respiratory:  Negative for cough, shortness of breath, wheezing and stridor.   Cardiovascular:  Negative for chest pain, palpitations and leg swelling.  Gastrointestinal:  Positive for constipation. Negative for abdominal pain, blood in stool, diarrhea, nausea and vomiting.  Endocrine: Negative for polydipsia.  Genitourinary:  Negative for dysuria, flank pain, frequency, hematuria and urgency.  Musculoskeletal:  Positive for arthralgias, back pain, gait problem and joint swelling.  Negative for myalgias and neck pain.  Skin:  Negative for rash.  Allergic/Immunologic: Negative for environmental allergies.  Neurological:  Negative for dizziness, tremors, seizures, weakness and headaches.  Hematological:  Does not bruise/bleed easily.  Psychiatric/Behavioral:  Negative for dysphoric mood and suicidal ideas. The patient is not nervous/anxious.       Objective:    Physical Exam Vitals reviewed.  Constitutional:      General: He is not in acute distress.    Appearance: Normal appearance. He is well-developed. He is not ill-appearing, toxic-appearing or diaphoretic.     Comments: Significant push of speech somewhat angry at times restless  HENT:     Head: Normocephalic and atraumatic.     Nose: Nose normal. No nasal deformity, septal deviation, mucosal edema or rhinorrhea.     Right Sinus: No maxillary sinus tenderness or frontal sinus tenderness.     Left Sinus: No maxillary sinus tenderness or frontal sinus tenderness.     Mouth/Throat:     Mouth: Mucous membranes are moist.     Pharynx: Oropharynx is clear. No oropharyngeal exudate.     Comments: Mild dental caries Eyes:     General: No  scleral icterus.    Conjunctiva/sclera: Conjunctivae normal.     Pupils: Pupils are equal, round, and reactive to light.  Neck:     Thyroid: No thyromegaly.     Vascular: No carotid bruit or JVD.     Trachea: Trachea normal. No tracheal tenderness or tracheal deviation.  Cardiovascular:     Rate and Rhythm: Normal rate and regular rhythm.     Chest Wall: PMI is not displaced.     Pulses: Normal pulses. No decreased pulses.     Heart sounds: Normal heart sounds, S1 normal and S2 normal. Heart sounds not distant. No murmur heard.    No systolic murmur is present.     No diastolic murmur is present.     No friction rub. No gallop. No S3 or S4 sounds.  Pulmonary:     Effort: No tachypnea, accessory muscle usage or respiratory distress.     Breath sounds: No stridor. No decreased breath sounds, wheezing, rhonchi or rales.  Chest:     Chest wall: No tenderness.  Abdominal:     General: Bowel sounds are normal. There is no distension.     Palpations: Abdomen is soft. Abdomen is not rigid.     Tenderness: There is no abdominal tenderness. There is no guarding or rebound.  Musculoskeletal:        General: Tenderness present. No swelling or deformity. Normal range of motion.     Cervical back: Normal range of motion and neck supple. No edema, erythema or rigidity. No muscular tenderness. Normal range of motion.     Right lower leg: No edema.     Left lower leg: No edema.     Comments: Point tenderness in the left lower back with reproducible pain.  The patient is neurologically intact in the lower extremities.  Significantly inflamed tophaceous gout left elbow olecranon bursa  Lymphadenopathy:     Head:     Right side of head: No submental or submandibular adenopathy.     Left side of head: No submental or submandibular adenopathy.     Cervical: No cervical adenopathy.  Skin:    General: Skin is warm and dry.     Coloration: Skin is not pale.     Findings: No rash.     Nails: There is  no clubbing.  Neurological:     General: No focal deficit present.     Mental Status: He is alert and oriented to person, place, and time. Mental status is at baseline.     Cranial Nerves: No cranial nerve deficit.     Motor: No weakness.     Coordination: Coordination normal.     Gait: Gait normal.     Deep Tendon Reflexes: Reflexes normal.     Comments: Patient has extreme difficulty arising from a sitting position in the chair.  Psychiatric:        Mood and Affect: Mood normal.        Speech: Speech normal.        Behavior: Behavior normal.        Thought Content: Thought content normal.        Judgment: Judgment normal.     BP (!) 142/90   Pulse 96   Ht 5\' 7"  (1.702 m)   Wt 157 lb 6.4 oz (71.4 kg)   SpO2 99%   BMI 24.65 kg/m  Wt Readings from Last 3 Encounters:  05/06/22 157 lb 6.4 oz (71.4 kg)  03/17/22 163 lb (73.9 kg)  03/06/22 164 lb 9.6 oz (74.7 kg)     Health Maintenance Due  Topic Date Due   COVID-19 Vaccine (1) Never done   COLON CANCER SCREENING ANNUAL FOBT  Never done    There are no preventive care reminders to display for this patient.  Lab Results  Component Value Date   TSH 0.714 12/31/2020   Lab Results  Component Value Date   WBC 5.6 03/06/2022   HGB 9.1 (L) 03/06/2022   HCT 27.1 (L) 03/06/2022   MCV 93.1 03/06/2022   PLT 188 03/06/2022   Lab Results  Component Value Date   NA 139 03/17/2022   K 3.4 (L) 03/17/2022   CO2 17 (L) 03/17/2022   GLUCOSE 134 (H) 03/17/2022   BUN 6 03/17/2022   CREATININE 0.76 03/17/2022   BILITOT 1.3 (H) 03/17/2022   ALKPHOS 193 (H) 03/17/2022   AST 213 (H) 03/17/2022   ALT 56 (H) 03/17/2022   PROT 8.6 (H) 03/17/2022   ALBUMIN 2.9 (L) 03/17/2022   CALCIUM 8.0 (L) 03/17/2022   ANIONGAP 8 01/31/2021   EGFR 109 03/17/2022   Lab Results  Component Value Date   CHOL 78 (L) 12/31/2020   Lab Results  Component Value Date   HDL 30 (L) 12/31/2020   Lab Results  Component Value Date   LDLCALC 34  12/31/2020   Lab Results  Component Value Date   TRIG 58 12/31/2020   Lab Results  Component Value Date   CHOLHDL 2.6 12/31/2020   No results found for: "HGBA1C" 01/2021 MRI Lumbar: IMPRESSION: 1. At L4-5 there is a broad-based disc bulge. Mild bilateral facet arthropathy. Severe spinal stenosis. Mild bilateral foraminal stenosis. Bilateral subarticular recess stenosis. 2.  No acute osseous injury of the lumbar spine.   Assessment & Plan:   Problem List Items Addressed This Visit       Cardiovascular and Mediastinum   Essential hypertension    Blood pressure not well-controlled will increase amlodipine to 10 mg daily and begin valsartan 160 mg daily      Relevant Medications   amLODipine (NORVASC) 10 MG tablet   valsartan (DIOVAN) 160 MG tablet     Nervous and Auditory   Lumbar radiculopathy    Referral back to orthopedic spine was made  Relevant Orders   Ambulatory referral to Orthopedic Surgery     Musculoskeletal and Integument   Gout, tophaceous    Tophaceous gout flare flare we will give indomethacin and increase dose of colchicine  Will have to stop allopurinol due to elevated liver function will follow-up liver function      Relevant Medications   colchicine 0.6 MG tablet   indomethacin (INDOCIN) 25 MG capsule   Other Relevant Orders   CBC with Differential/Platelet     Other   Elevated liver function tests - Primary    Due to allopurinol use will hold allopurinol and follow-up liver function      Relevant Orders   Comprehensive metabolic panel   US Abdomen Limited RUQ (LIVER/GB)   Meds ordered this encounter  Medications   amLODipine (NORVASC) 10 MG tablet    Sig: Take 1 tablet (10 mg total) by mouth daily.    Dispense:  90 tablet    Refill:  1   colchicine 0.6 MG tablet    Sig: Take 2 tablet by mouth daily for a gout flare.    Dispense:  60 tablet    Refill:  2   indomethacin (INDOCIN) 25 MG capsule    Sig: Take 1 capsule (25 mg  total) by mouth 3 (three) times daily with meals. Until bottle empty and STOP    Dispense:  60 capsule    Refill:  0   valsartan (DIOVAN) 160 MG tablet    Sig: Take 1 tablet (160 mg total) by mouth daily.    Dispense:  90 tablet    Refill:  3  38 minutes spent extra time needed because of language barrier multisystems assessed Follow-up: Return in about 2 months (around 07/05/2022) for htn, chronic pain.    Asencion Noble, MD

## 2022-05-07 ENCOUNTER — Other Ambulatory Visit: Payer: Self-pay

## 2022-05-07 LAB — CBC WITH DIFFERENTIAL/PLATELET
Basophils Absolute: 0 10*3/uL (ref 0.0–0.2)
Basos: 1 %
EOS (ABSOLUTE): 0 10*3/uL (ref 0.0–0.4)
Eos: 0 %
Hematocrit: 25.8 % — ABNORMAL LOW (ref 37.5–51.0)
Hemoglobin: 8.6 g/dL — ABNORMAL LOW (ref 13.0–17.7)
Immature Grans (Abs): 0 10*3/uL (ref 0.0–0.1)
Immature Granulocytes: 0 %
Lymphocytes Absolute: 1.6 10*3/uL (ref 0.7–3.1)
Lymphs: 24 %
MCH: 30.7 pg (ref 26.6–33.0)
MCHC: 33.3 g/dL (ref 31.5–35.7)
MCV: 92 fL (ref 79–97)
Monocytes Absolute: 0.8 10*3/uL (ref 0.1–0.9)
Monocytes: 13 %
NRBC: 1 % — ABNORMAL HIGH (ref 0–0)
Neutrophils Absolute: 4.1 10*3/uL (ref 1.4–7.0)
Neutrophils: 62 %
Platelets: 74 10*3/uL — CL (ref 150–450)
RBC: 2.8 x10E6/uL — ABNORMAL LOW (ref 4.14–5.80)
RDW: 16.6 % — ABNORMAL HIGH (ref 11.6–15.4)
WBC: 6.7 10*3/uL (ref 3.4–10.8)

## 2022-05-07 LAB — COMPREHENSIVE METABOLIC PANEL
ALT: 52 IU/L — ABNORMAL HIGH (ref 0–44)
AST: 170 IU/L — ABNORMAL HIGH (ref 0–40)
Albumin/Globulin Ratio: 0.5 — ABNORMAL LOW (ref 1.2–2.2)
Albumin: 2.8 g/dL — ABNORMAL LOW (ref 3.8–4.9)
Alkaline Phosphatase: 160 IU/L — ABNORMAL HIGH (ref 44–121)
BUN/Creatinine Ratio: 8 — ABNORMAL LOW (ref 9–20)
BUN: 6 mg/dL (ref 6–24)
Bilirubin Total: 2.9 mg/dL — ABNORMAL HIGH (ref 0.0–1.2)
CO2: 20 mmol/L (ref 20–29)
Calcium: 7.9 mg/dL — ABNORMAL LOW (ref 8.7–10.2)
Chloride: 97 mmol/L (ref 96–106)
Creatinine, Ser: 0.74 mg/dL — ABNORMAL LOW (ref 0.76–1.27)
Globulin, Total: 5.9 g/dL — ABNORMAL HIGH (ref 1.5–4.5)
Glucose: 76 mg/dL (ref 70–99)
Potassium: 3.4 mmol/L — ABNORMAL LOW (ref 3.5–5.2)
Sodium: 134 mmol/L (ref 134–144)
Total Protein: 8.7 g/dL — ABNORMAL HIGH (ref 6.0–8.5)
eGFR: 110 mL/min/{1.73_m2} (ref >59)

## 2022-05-07 NOTE — Progress Notes (Signed)
Let pt know stay on iron he is anemic, liver function high stay off allopurinol

## 2022-05-08 ENCOUNTER — Telehealth: Payer: Self-pay

## 2022-05-08 NOTE — Telephone Encounter (Signed)
-----  Message from Elsie Stain, MD sent at 05/07/2022  5:05 PM EST ----- Let pt know stay on iron he is anemic, liver function high stay off allopurinol

## 2022-05-08 NOTE — Telephone Encounter (Signed)
Pt was called and vm was left. Information was sent to nurse pool    Interpreter: alex Number: [phone]

## 2022-05-14 ENCOUNTER — Other Ambulatory Visit: Payer: Medicaid Other

## 2022-05-21 ENCOUNTER — Encounter: Payer: Self-pay | Admitting: *Deleted

## 2022-05-22 NOTE — Congregational Nurse Program (Signed)
  Dept: [phone]   Congregational Nurse Program Note  Date of Encounter: 05/21/2022  Past Medical History: Past Medical History:  Diagnosis Date   Gout 12/30/2017    Encounter Details:  CNP Questionnaire - 05/21/22 1630       Questionnaire   Ask client: Do you give verbal consent for me to treat you today? Yes    Student Assistance N/A    Location Patient [place]    Visit Setting with Client Home    Patient Status Migrant    Insurance Medicaid    Insurance/Financial Assistance Referral N/A    Medication Have Medication Insecurities    Medical Provider Yes    Screening Referrals Made N/A    Medical Referrals Made N/A    Medical Appointment Made N/A    Recently w/o PCP, now 1st time PCP visit completed due to CNs referral or appointment made N/A    Food Have Food Insecurities    Transportation Need transportation assistance;Provided transportation assistance    Housing/Utilities No permanent housing    Interpersonal Safety N/A    Interventions Advocate/Support;Counsel;Educate    Abnormal to Normal Screening Since Last CN Visit N/A    Screenings CN Performed N/A    Sent Client to Lab for: N/A    Did client attend any of the following based off CNs referral or appointments made? N/A    ED Visit Averted Yes    Life-Saving Intervention Made N/A            client called requesting assistance from this CN.  Provided transportation for client to pay phone bill and to purchase food for his family.  Client requested that I assist with his application for disability.  This CN will accompany client to DSS next Tuesday to begin the application process.  Client does have letter from his PCP stating that he is disabled and has been out of work for the past 18 months.  Will see client next Tuesday and follow up as needed.  Karene Fry, RN, MSN, [place] Office [phone] Cell

## 2022-05-23 ENCOUNTER — Other Ambulatory Visit: Payer: Self-pay | Admitting: Critical Care Medicine

## 2022-05-25 NOTE — Telephone Encounter (Signed)
Unable to refill per protocol, Rx sig states to stop medication after completion. Will refuse.  Requested Prescriptions  Pending Prescriptions Disp Refills   indomethacin (INDOCIN) 25 MG capsule [Pharmacy Med Name: INDOMETHACIN 25MG  CAPSULES] 60 capsule 0    Sig: TAKE 1 CAPSULE(25 MG) BY MOUTH THREE TIMES DAILY WITH MEALS UNTIL ALL TAKEN AND STOP     Analgesics:  NSAIDS Failed - 05/23/2022  3:07 AM      Failed - Manual Review: Labs are only required if the patient has taken medication for more than 8 weeks.      Failed - Cr in normal range and within 360 days    Creat  Date Value Ref Range Status  03/06/2022 0.80 0.70 - 1.30 mg/dL Final   Creatinine, Ser  Date Value Ref Range Status  05/06/2022 0.74 (L) 0.76 - 1.27 mg/dL Final         Failed - HGB in normal range and within 360 days    Hemoglobin  Date Value Ref Range Status  05/06/2022 8.6 (L) 13.0 - 17.7 g/dL Final         Failed - PLT in normal range and within 360 days    Platelets  Date Value Ref Range Status  05/06/2022 74 (LL) 150 - 450 x10E3/uL Final    Comment:    Actual platelet count may be somewhat higher than reported due to aggregation of platelets in this sample.          Failed - HCT in normal range and within 360 days    Hematocrit  Date Value Ref Range Status  05/06/2022 25.8 (L) 37.5 - 51.0 % Final         Passed - eGFR is 30 or above and within 360 days    GFR calc Af Amer  Date Value Ref Range Status  09/28/2019 123 >59 mL/min/1.73 Final    Comment:    **[facility] currently reports eGFR in compliance with the current**   recommendations of the [employer]. [facility] will   update reporting as new guidelines are published from the NKF-ASN   Task force.    GFR, Estimated  Date Value Ref Range Status  01/31/2021 >60 >60 mL/min Final    Comment:    (NOTE) Calculated using the CKD-EPI Creatinine Equation (2021)    eGFR  Date Value Ref Range Status  05/06/2022 110 >59  mL/min/1.73 Final         Passed - Patient is not pregnant      Passed - Valid encounter within last 12 months    Recent Outpatient Visits           2 weeks ago Elevated liver function tests   [place], MD   2 months ago Other constipation   [place], [place], MD   3 months ago Essential hypertension   [place], Jarome Matin, RPH-CPP   4 months ago Essential hypertension   [place], Jarome Matin, RPH-CPP   6 months ago Essential hypertension   [place], MD       Future Appointments             In 1 month Joya Gaskins Burnett Harry, MD [place]

## 2022-05-26 ENCOUNTER — Encounter: Payer: Self-pay | Admitting: *Deleted

## 2022-05-26 NOTE — Congregational Nurse Program (Signed)
  Dept: [phone]   Congregational Nurse Program Note  Date of Encounter: 05/26/2022  Past Medical History: Past Medical History:  Diagnosis Date   Gout 12/30/2017    Encounter Details:  CNP Questionnaire - 05/26/22 0900       Questionnaire   Ask client: Do you give verbal consent for me to treat you today? Yes    Student Assistance N/A    Location Patient [place]    Visit Setting with Client Home    Patient Status Migrant    Insurance Medicaid    Insurance/Financial Assistance Referral N/A    Medication Have Medication Insecurities    Medical Provider Yes    Screening Referrals Made N/A    Medical Referrals Made N/A    Medical Appointment Made N/A    Recently w/o PCP, now 1st time PCP visit completed due to CNs referral or appointment made N/A    Food Have Food Insecurities    Transportation Need transportation assistance;Provided transportation assistance    Housing/Utilities No permanent housing    Interpersonal Safety N/A    Interventions Advocate/Support;Counsel;Educate    Abnormal to Normal Screening Since Last CN Visit N/A    Screenings CN Performed N/A    Sent Client to Lab for: N/A    Did client attend any of the following based off CNs referral or appointments made? N/A    ED Visit Averted Yes    Life-Saving Intervention Made N/A            This CN received request from client to go to Food stamp office on [address] and to go to [facility] office to begin a disability application.  Assisted client with transportation to offices.  This CN contacted [facility] and we will work together to complete a disability application with client.  We will then submit application and make an appointment for client to talk to the disability case worker.  Will follow up with client as needed.  Karene Fry, RN, MSN, [place] Office [phone] Cell

## 2022-05-28 ENCOUNTER — Other Ambulatory Visit: Payer: Medicaid Other

## 2022-06-03 ENCOUNTER — Encounter: Payer: Self-pay | Admitting: *Deleted

## 2022-06-03 NOTE — Congregational Nurse Program (Signed)
  Dept: [phone]   Congregational Nurse Program Note  Date of Encounter: 06/03/2022  Past Medical History: Past Medical History:  Diagnosis Date   Gout 12/30/2017    Encounter Details:  CNP Questionnaire - 06/03/22 1438       Questionnaire   Ask client: Do you give verbal consent for me to treat you today? Yes    Student Assistance N/A    Location Patient [place]    Visit Setting with Client Phone/Text/Email    Patient Status Migrant    Insurance Medicaid    Insurance/Financial Assistance Referral N/A    Medication Have Medication Insecurities    Medical Provider Yes    Screening Referrals Made N/A    Medical Referrals Made N/A    Medical Appointment Made N/A    Recently w/o PCP, now 1st time PCP visit completed due to CNs referral or appointment made N/A    Food Have Food Insecurities    Transportation Need transportation assistance    Housing/Utilities No permanent housing    Interpersonal Safety N/A    Interventions Advocate/Support;Counsel;Educate;Navigate Healthcare System    Abnormal to Normal Screening Since Last CN Visit N/A    Screenings CN Performed N/A    Sent Client to Lab for: N/A    Did client attend any of the following based off CNs referral or appointments made? N/A    ED Visit Averted Yes    Life-Saving Intervention Made N/A            Received a call from client asking about his disability application.  This CN with EPIC assistance has started the disability application today.  I will follow up with client to complete the forms and then will submit forms to start application process.    Karene Fry, RN, MSN, [place] Office [phone] Cell

## 2022-06-08 ENCOUNTER — Encounter: Payer: Self-pay | Admitting: *Deleted

## 2022-06-08 NOTE — Congregational Nurse Program (Signed)
  Dept: [phone]   Congregational Nurse Program Note  Date of Encounter: 06/08/2022  Past Medical History: Past Medical History:  Diagnosis Date   Gout 12/30/2017    Encounter Details:  CNP Questionnaire - 06/08/22 1330       Questionnaire   Ask client: Do you give verbal consent for me to treat you today? Yes    Student Assistance N/A    Location Patient [place]    Visit Setting with Client Home    Patient Status Migrant    Insurance Medicaid    Insurance/Financial Assistance Referral N/A    Medication Have Medication Insecurities    Medical Provider Yes    Screening Referrals Made N/A    Medical Referrals Made N/A    Medical Appointment Made N/A    Recently w/o PCP, now 1st time PCP visit completed due to CNs referral or appointment made N/A    Food Have Food Insecurities    Transportation Need transportation assistance    Housing/Utilities No permanent housing    Interpersonal Safety N/A    Interventions Advocate/Support;Counsel;Educate;Navigate Healthcare System    Abnormal to Normal Screening Since Last CN Visit N/A    Screenings CN Performed N/A    Sent Client to Lab for: N/A    Did client attend any of the following based off CNs referral or appointments made? N/A    ED Visit Averted Yes    Life-Saving Intervention Made N/A            Met with client in his home.  Daughter home sick today with respiratory symptoms.  Client seems well.  Assisted client with disability application follow up.  Also reminded client of upcoming appointment this Friday at 1030.  This CN will assure that transportation is provided to and from the scheduled appointment.  Will follow up in the next four days with client.  Karene Fry, RN, MSN, [place] Office [phone] Cell

## 2022-06-12 ENCOUNTER — Ambulatory Visit: Payer: Medicaid Other | Admitting: Orthopedic Surgery

## 2022-06-16 ENCOUNTER — Encounter: Payer: Self-pay | Admitting: Orthopedic Surgery

## 2022-06-16 ENCOUNTER — Ambulatory Visit (INDEPENDENT_AMBULATORY_CARE_PROVIDER_SITE_OTHER): Payer: Medicaid Other

## 2022-06-16 ENCOUNTER — Ambulatory Visit (INDEPENDENT_AMBULATORY_CARE_PROVIDER_SITE_OTHER): Payer: Medicaid Other | Admitting: Orthopedic Surgery

## 2022-06-16 VITALS — BP 132/83 | HR 88 | Ht 67.0 in | Wt 158.0 lb

## 2022-06-16 DIAGNOSIS — M545 Low back pain, unspecified: Secondary | ICD-10-CM | POA: Diagnosis not present
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
## 2022-07-02 ENCOUNTER — Ambulatory Visit: Payer: Medicaid Other | Admitting: Critical Care Medicine

## 2022-07-02 NOTE — Progress Notes (Deleted)
Established Patient Office Visit  Subjective:  Patient ID: Brandon Arias, male    DOB: 06-30-1970  Age: 52 y.o. MRN: [id number]  CC:  Primary care follow-up  HPI  05/2021 Brandon Arias presents for evaluation of right lower back pain which the patient subscribes to being his kidney.  This visit was assisted with [place] interpreter Eddie Dibbles [phone] on video  Patient originally is from [place] and was brought to the clinic under the guidance of the [employer] [facility].  The patient has had 3 COVID vaccinations on file.  Patient has a history of tophaceous gout involving both elbows for which she has had multiple encounters in urgent care emergency room and other providers.  The patient is due colon cancer screening.  He does now have Medicaid.  Patient does drink alcohol with various parties and birthday events on weekends.  He has been in the emergency room for this previously with elevated liver function tests.  He now complains of difficulty with balance and carrying objects he is not able to work.  He has severe pain in the lower back which results in difficulty standing and sitting.  He also has gout flares in the elbows and states the indomethacin has been given and the low-dose allopurinol has not been of any benefit.  He does have a very high uric acid level but no renal insufficiency.  He also is requesting some help in his home environment.  He has been on prednisone Indocin and Allender Purinol along with methocarbamol without much relief at this time.  Patient has no other complaints.  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  The patient has since last being seen here has been evaluated by spinal surgeon who is planning spinal surgery in the next 2 months.  He is yet to get him to rheumatology because of his tophaceous gout Patient still has elbow pain.  He also did not pick up his allopurinol.  He has been using the meloxicam.  On  arrival blood pressure is good 130/85.  He has been using meloxicam and the Flexeril. The patient has stopped drinking alcohol.  He is less anxious at this visit compared to prior visits.  7/17 This patient is seen in return visit and visit was assisted with video [place] interpreter Christella [phone]  On arrival blood pressure elevated 150/90.  Patient still has chronic back pain was supposed of had surgery but has been postponed till November.  Patient was positive seeing rheumatology for his tophaceous gout however he did not respond when attempts to make appointment was made.  I will need to get his nurse case manager to assist in this in the [place] nurse program.  Patient needs a letter stating he cannot work due to his back pain.  We did do a TB screen at the last visit it was negative.  05/06/22 Patient seen in return follow-up I have not seen him since July 23 but he was seen in November by clinical pharmacy and my partner Dr. Margarita Rana.  He has had problems with abdominal complaints with constipation.  The patient also has continued low back pain and today comes in with severely swollen left elbow.  He has tophaceous gout but it appears he is having a gout flare in the olecranon bursa of the left elbow.  Note this visit was assisted by a [place] interpreter [place].  He had a liver ultrasound scheduled but has yet to complete this.  He has  had elevated liver enzymes and has been on allopurinol.  He has been followed by rheumatology but did not follow-up with him.  They started him on allopurinol with a titrating dose up they wanted him to come back for labs he did not return.  He was seen by primary care here in November had labs done showing elevated enzymes is still taking the allopurinol.  He also takes colchicine.  07/02/22 Essential hypertension       Blood pressure not well-controlled will increase amlodipine to 10 mg daily and begin valsartan 160 mg daily        Relevant  Medications    amLODipine (NORVASC) 10 MG tablet    valsartan (DIOVAN) 160 MG tablet        Nervous and Auditory    Lumbar radiculopathy      Referral back to orthopedic spine was made        Relevant Orders    Ambulatory referral to Orthopedic Surgery        Musculoskeletal and Integument    Gout, tophaceous      Tophaceous gout flare flare we will give indomethacin and increase dose of colchicine   Will have to stop allopurinol due to elevated liver function will follow-up liver function        Relevant Medications    colchicine 0.6 MG tablet    indomethacin (INDOCIN) 25 MG capsule    Other Relevant Orders    CBC with Differential/Platelet        Other    Elevated liver function tests - Primary      Due to allopurinol use will hold allopurinol and follow-up liver function     Past Medical History:  Diagnosis Date   Gout 12/30/2017    Past Surgical History:  Procedure Laterality Date   no known surgeries      No family history on file.  Social History   Socioeconomic History   Marital status: Married    Spouse name: Not on file   Number of children: Not on file   Years of education: Not on file   Highest education level: Not on file  Occupational History   Not on file  Tobacco Use   Smoking status: Never    Passive exposure: Never   Smokeless tobacco: Never  Vaping Use   Vaping Use: Never used  Substance and Sexual Activity   Alcohol use: Not Currently   Drug use: Never   Sexual activity: Not Currently  Other Topics Concern   Not on file  Social History Narrative   Not on file   Social Determinants of Health   Financial Resource Strain: Not on file  Food Insecurity: Not on file  Transportation Needs: Not on file  Physical Activity: Not on file  Stress: Not on file  Social Connections: Not on file  Intimate Partner Violence: Not on file    Outpatient Medications Prior to Visit  Medication Sig Dispense Refill   amLODipine (NORVASC) 10 MG  tablet Take 1 tablet (10 mg total) by mouth daily. 90 tablet 1   colchicine 0.6 MG tablet Take 2 tablet by mouth daily for a gout flare. 60 tablet 2   cyclobenzaprine (FLEXERIL) 10 MG tablet Take 1 tablet (10 mg total) by mouth 3 (three) times daily as needed for muscle spasms. (Patient not taking: Reported on 03/06/2022) 90 tablet 2   indomethacin (INDOCIN) 25 MG capsule Take 1 capsule (25 mg total) by mouth 3 (three) times daily with  meals. Until bottle empty and STOP 60 capsule 0   polyethylene glycol powder (GLYCOLAX/MIRALAX) 17 GM/SCOOP powder Take 17 g by mouth daily. (Patient not taking: Reported on 05/06/2022) 3350 g 1   valsartan (DIOVAN) 160 MG tablet Take 1 tablet (160 mg total) by mouth daily. 90 tablet 3   No facility-administered medications prior to visit.    No Known Allergies  ROS Review of Systems  Constitutional:  Negative for chills, diaphoresis and fever.  HENT:  Negative for congestion, hearing loss, nosebleeds, sore throat and tinnitus.   Eyes:  Negative for photophobia and redness.  Respiratory:  Negative for cough, shortness of breath, wheezing and stridor.   Cardiovascular:  Negative for chest pain, palpitations and leg swelling.  Gastrointestinal:  Positive for constipation. Negative for abdominal pain, blood in stool, diarrhea, nausea and vomiting.  Endocrine: Negative for polydipsia.  Genitourinary:  Negative for dysuria, flank pain, frequency, hematuria and urgency.  Musculoskeletal:  Positive for arthralgias, back pain, gait problem and joint swelling. Negative for myalgias and neck pain.  Skin:  Negative for rash.  Allergic/Immunologic: Negative for environmental allergies.  Neurological:  Negative for dizziness, tremors, seizures, weakness and headaches.  Hematological:  Does not bruise/bleed easily.  Psychiatric/Behavioral:  Negative for dysphoric mood and suicidal ideas. The patient is not nervous/anxious.       Objective:    Physical Exam Vitals  reviewed.  Constitutional:      General: He is not in acute distress.    Appearance: Normal appearance. He is well-developed. He is not ill-appearing, toxic-appearing or diaphoretic.     Comments: Significant push of speech somewhat angry at times restless  HENT:     Head: Normocephalic and atraumatic.     Nose: Nose normal. No nasal deformity, septal deviation, mucosal edema or rhinorrhea.     Right Sinus: No maxillary sinus tenderness or frontal sinus tenderness.     Left Sinus: No maxillary sinus tenderness or frontal sinus tenderness.     Mouth/Throat:     Mouth: Mucous membranes are moist.     Pharynx: Oropharynx is clear. No oropharyngeal exudate.     Comments: Mild dental caries Eyes:     General: No scleral icterus.    Conjunctiva/sclera: Conjunctivae normal.     Pupils: Pupils are equal, round, and reactive to light.  Neck:     Thyroid: No thyromegaly.     Vascular: No carotid bruit or JVD.     Trachea: Trachea normal. No tracheal tenderness or tracheal deviation.  Cardiovascular:     Rate and Rhythm: Normal rate and regular rhythm.     Chest Wall: PMI is not displaced.     Pulses: Normal pulses. No decreased pulses.     Heart sounds: Normal heart sounds, S1 normal and S2 normal. Heart sounds not distant. No murmur heard.    No systolic murmur is present.     No diastolic murmur is present.     No friction rub. No gallop. No S3 or S4 sounds.  Pulmonary:     Effort: No tachypnea, accessory muscle usage or respiratory distress.     Breath sounds: No stridor. No decreased breath sounds, wheezing, rhonchi or rales.  Chest:     Chest wall: No tenderness.  Abdominal:     General: Bowel sounds are normal. There is no distension.     Palpations: Abdomen is soft. Abdomen is not rigid.     Tenderness: There is no abdominal tenderness. There is no guarding or rebound.  Musculoskeletal:        General: Tenderness present. No swelling or deformity. Normal range of motion.      Cervical back: Normal range of motion and neck supple. No edema, erythema or rigidity. No muscular tenderness. Normal range of motion.     Right lower leg: No edema.     Left lower leg: No edema.     Comments: Point tenderness in the left lower back with reproducible pain.  The patient is neurologically intact in the lower extremities.  Significantly inflamed tophaceous gout left elbow olecranon bursa  Lymphadenopathy:     Head:     Right side of head: No submental or submandibular adenopathy.     Left side of head: No submental or submandibular adenopathy.     Cervical: No cervical adenopathy.  Skin:    General: Skin is warm and dry.     Coloration: Skin is not pale.     Findings: No rash.     Nails: There is no clubbing.  Neurological:     General: No focal deficit present.     Mental Status: He is alert and oriented to person, place, and time. Mental status is at baseline.     Cranial Nerves: No cranial nerve deficit.     Motor: No weakness.     Coordination: Coordination normal.     Gait: Gait normal.     Deep Tendon Reflexes: Reflexes normal.     Comments: Patient has extreme difficulty arising from a sitting position in the chair.  Psychiatric:        Mood and Affect: Mood normal.        Speech: Speech normal.        Behavior: Behavior normal.        Thought Content: Thought content normal.        Judgment: Judgment normal.     There were no vitals taken for this visit. Wt Readings from Last 3 Encounters:  06/16/22 158 lb (71.7 kg)  05/06/22 157 lb 6.4 oz (71.4 kg)  03/17/22 163 lb (73.9 kg)     Health Maintenance Due  Topic Date Due   COVID-19 Vaccine (1) Never done   COLON CANCER SCREENING ANNUAL FOBT  Never done    There are no preventive care reminders to display for this patient.  Lab Results  Component Value Date   TSH 0.714 12/31/2020   Lab Results  Component Value Date   WBC 6.7 05/06/2022   HGB 8.6 (L) 05/06/2022   HCT 25.8 (L) 05/06/2022    MCV 92 05/06/2022   PLT 74 (LL) 05/06/2022   Lab Results  Component Value Date   NA 134 05/06/2022   K 3.4 (L) 05/06/2022   CO2 20 05/06/2022   GLUCOSE 76 05/06/2022   BUN 6 05/06/2022   CREATININE 0.74 (L) 05/06/2022   BILITOT 2.9 (H) 05/06/2022   ALKPHOS 160 (H) 05/06/2022   AST 170 (H) 05/06/2022   ALT 52 (H) 05/06/2022   PROT 8.7 (H) 05/06/2022   ALBUMIN 2.8 (L) 05/06/2022   CALCIUM 7.9 (L) 05/06/2022   ANIONGAP 8 01/31/2021   EGFR 110 05/06/2022   Lab Results  Component Value Date   CHOL 78 (L) 12/31/2020   Lab Results  Component Value Date   HDL 30 (L) 12/31/2020   Lab Results  Component Value Date   LDLCALC 34 12/31/2020   Lab Results  Component Value Date   TRIG 58 12/31/2020   Lab Results  Component  Value Date   CHOLHDL 2.6 12/31/2020   No results found for: "HGBA1C" 01/2021 MRI Lumbar: IMPRESSION: 1. At L4-5 there is a broad-based disc bulge. Mild bilateral facet arthropathy. Severe spinal stenosis. Mild bilateral foraminal stenosis. Bilateral subarticular recess stenosis. 2.  No acute osseous injury of the lumbar spine.   Assessment & Plan:   Problem List Items Addressed This Visit   None No orders of the defined types were placed in this encounter. 38 minutes spent extra time needed because of language barrier multisystems assessed Follow-up: No follow-ups on file.    Asencion Noble, MD

## 2022-07-06 ENCOUNTER — Encounter: Payer: Self-pay | Admitting: *Deleted

## 2022-07-07 ENCOUNTER — Encounter: Payer: Self-pay | Admitting: *Deleted

## 2022-07-08 ENCOUNTER — Emergency Department (HOSPITAL_COMMUNITY): Payer: Medicaid Other

## 2022-07-08 ENCOUNTER — Emergency Department (HOSPITAL_COMMUNITY)
Admission: EM | Admit: 2022-07-08 | Discharge: 2022-07-08 | Disposition: A | Discharge status: Home / Self Care | Payer: Medicaid Other | Attending: Emergency Medicine | Admitting: Emergency Medicine

## 2022-07-08 ENCOUNTER — Encounter (HOSPITAL_COMMUNITY): Payer: Self-pay

## 2022-07-08 DIAGNOSIS — Z5329 Procedure and treatment not carried out because of patient's decision for other reasons: Secondary | ICD-10-CM | POA: Insufficient documentation

## 2022-07-08 DIAGNOSIS — N179 Acute kidney failure, unspecified: Secondary | ICD-10-CM

## 2022-07-08 DIAGNOSIS — Y907 Blood alcohol level of 200-239 mg/100 ml: Secondary | ICD-10-CM | POA: Insufficient documentation

## 2022-07-08 DIAGNOSIS — D649 Anemia, unspecified: Secondary | ICD-10-CM

## 2022-07-08 DIAGNOSIS — F10920 Alcohol use, unspecified with intoxication, uncomplicated: Secondary | ICD-10-CM | POA: Insufficient documentation

## 2022-07-08 LAB — COMPREHENSIVE METABOLIC PANEL
ALT: 50 U/L — ABNORMAL HIGH (ref 0–44)
AST: 124 U/L — ABNORMAL HIGH (ref 15–41)
Albumin: 2.3 g/dL — ABNORMAL LOW (ref 3.5–5.0)
Alkaline Phosphatase: 144 U/L — ABNORMAL HIGH (ref 38–126)
Anion gap: 12 (ref 5–15)
BUN: 9 mg/dL (ref 6–20)
CO2: 18 mmol/L — ABNORMAL LOW (ref 22–32)
Calcium: 8.1 mg/dL — ABNORMAL LOW (ref 8.9–10.3)
Chloride: 106 mmol/L (ref 98–111)
Creatinine, Ser: 1.43 mg/dL — ABNORMAL HIGH (ref 0.61–1.24)
GFR, Estimated: 59 mL/min — ABNORMAL LOW (ref >60)
Glucose, Bld: 84 mg/dL (ref 70–99)
Potassium: 4.1 mmol/L (ref 3.5–5.1)
Sodium: 136 mmol/L (ref 135–145)
Total Bilirubin: 0.8 mg/dL (ref 0.3–1.2)
Total Protein: 7.6 g/dL (ref 6.5–8.1)

## 2022-07-08 LAB — CBC WITH DIFFERENTIAL/PLATELET
Abs Immature Granulocytes: 0.02 10*3/uL (ref 0.00–0.07)
Basophils Absolute: 0 10*3/uL (ref 0.0–0.1)
Basophils Relative: 1 %
Eosinophils Absolute: 0 10*3/uL (ref 0.0–0.5)
Eosinophils Relative: 1 %
HCT: 24.5 % — ABNORMAL LOW (ref 39.0–52.0)
Hemoglobin: 7.3 g/dL — ABNORMAL LOW (ref 13.0–17.0)
Immature Granulocytes: 0 %
Lymphocytes Relative: 39 %
Lymphs Abs: 1.9 10*3/uL (ref 0.7–4.0)
MCH: 27.1 pg (ref 26.0–34.0)
MCHC: 29.8 g/dL — ABNORMAL LOW (ref 30.0–36.0)
MCV: 91.1 fL (ref 80.0–100.0)
Monocytes Absolute: 0.6 10*3/uL (ref 0.1–1.0)
Monocytes Relative: 12 %
Neutro Abs: 2.2 10*3/uL (ref 1.7–7.7)
Neutrophils Relative %: 47 %
Platelets: 95 10*3/uL — ABNORMAL LOW (ref 150–400)
RBC: 2.69 MIL/uL — ABNORMAL LOW (ref 4.22–5.81)
RDW: 21.8 % — ABNORMAL HIGH (ref 11.5–15.5)
WBC: 4.7 10*3/uL (ref 4.0–10.5)
nRBC: 1.3 % — ABNORMAL HIGH (ref 0.0–0.2)

## 2022-07-08 LAB — ETHANOL: Alcohol, Ethyl (B): 227 mg/dL — ABNORMAL HIGH (ref <10)

## 2022-07-08 LAB — CBG MONITORING, ED: Glucose-Capillary: 77 mg/dL (ref 70–99)

## 2022-07-08 LAB — LIPASE, BLOOD: Lipase: 51 U/L (ref 11–51)

## 2022-07-08 MED ORDER — LACTATED RINGERS IV BOLUS
1000.0000 mL | Freq: Once | INTRAVENOUS | Status: AC
  Administered 2022-07-08: 1000 mL via INTRAVENOUS

## 2022-07-08 NOTE — Discharge Instructions (Addendum)
You were seen today in the emergency department due to a fall.  Your blood pressure was very low and your hemoglobin was also very low at 7.3, I am concerned that this has been downtrending over the past year.  Worse case this could be a colon cancer or a GI bleed, it is very important you follow-up with your primary about this.  It would have been my preference to work this up more here in the emergency department but since you need to get home it is imperative you call your primary and set up an appointment to be seen as soon as possible.  Vous avez t vu aujourd'hui Engineer, building services. Votre tension artrielle tait trs basse et votre taux d'hmoglobine tait galement trs bas,  7,3. Je crains que cette tendance ait diminu au cours de la dernire [place]. Dans le pire des cas, il pourrait s'agir d'un cancer du clon ou d'une hmorragie gastro-intestinale, il est trs important que vous suiviez votre mdecin primaire  ce sujet. J'aurais prfr travailler davantage ici au service des [place], [place] comme vous devez rentrer Orma Render vous, il est impratif d'appeler votre primaire et de fixer un rendez-vous pour tre vu le plus tt possible.

## 2022-07-08 NOTE — ED Provider Notes (Signed)
Brandon Arias EMERGENCY DEPARTMENT AT [facility] Provider Note   CSN: [id number] Arrival date & time: 07/08/22  1546     History  Chief Complaint  Patient presents with   Alcohol Intoxication    Brandon Arias is a 52 y.o. male.  The history is provided by the patient, the EMS personnel and medical records. The history is limited by a language barrier. A language interpreter was used.  Alcohol Intoxication     Patient presents to the emergency department due to witnessed fall.  Patient states he drank alcohol prior to arrival, he was walking to the [employer]undromat. Injury.  Bystanders saw him collapse outside at the [facility], he did hit his head unsure if he lost consciousness.  Patient Brandon Arias is alcohol use prior to arrival, states otherwise he feels fine and would like to go home.  Nurse checked patient's blood pressure 3 times, systolic has been below 85 each time.  Patient put in room for additional workup.  Home Medications Prior to Admission medications   Medication Sig Start Date End Date Taking? Authorizing Provider  amLODipine (NORVASC) 10 MG tablet Take 1 tablet (10 mg total) by mouth daily. 05/06/22   Storm Frisk, MD  colchicine 0.6 MG tablet Take 2 tablet by mouth daily for a gout flare. 05/06/22   Storm Frisk, MD  cyclobenzaprine (FLEXERIL) 10 MG tablet Take 1 tablet (10 mg total) by mouth 3 (three) times daily as needed for muscle spasms. Patient not taking: Reported on 03/06/2022 11/10/21   Storm Frisk, MD  indomethacin (INDOCIN) 25 MG capsule Take 1 capsule (25 mg total) by mouth 3 (three) times daily with meals. Until bottle empty and STOP 05/06/22   Storm Frisk, MD  polyethylene glycol powder (GLYCOLAX/MIRALAX) 17 GM/SCOOP powder Take 17 g by mouth daily. Patient not taking: Reported on 05/06/2022 03/17/22   Hoy Register, MD  valsartan (DIOVAN) 160 MG tablet Take 1 tablet (160 mg total) by mouth daily. 05/06/22   Storm Frisk, MD       Allergies    Patient has no known allergies.    Review of Systems   Review of Systems  Physical Exam Updated Vital Signs BP 122/83   Pulse 84   Temp 98.2 F (36.8 C) (Oral)   Resp 15   SpO2 100%  Physical Exam Vitals and nursing note reviewed. Exam conducted with a chaperone present.  Constitutional:      Appearance: Normal appearance.  HENT:     Head: Normocephalic and atraumatic.  Eyes:     General: No scleral icterus.       Right eye: No discharge.        Left eye: No discharge.     Extraocular Movements: Extraocular movements intact.     Pupils: Pupils are equal, round, and reactive to light.  Cardiovascular:     Rate and Rhythm: Normal rate and regular rhythm.     Pulses: Normal pulses.     Heart sounds: Normal heart sounds.     No friction rub. No gallop.  Pulmonary:     Effort: Pulmonary effort is normal. No respiratory distress.     Breath sounds: Normal breath sounds.  Abdominal:     General: Abdomen is flat. Bowel sounds are normal. There is no distension.     Palpations: Abdomen is soft.     Tenderness: There is no abdominal tenderness.  Musculoskeletal:     Cervical back: Normal range of motion. No  tenderness.  Skin:    General: Skin is warm and dry.     Coloration: Skin is not jaundiced.  Neurological:     Mental Status: He is alert. Mental status is at baseline.     Coordination: Coordination normal.     ED Results / Procedures / Treatments   Labs (all labs ordered are listed, but only abnormal results are displayed) Labs Reviewed  CBC WITH DIFFERENTIAL/PLATELET - Abnormal; Notable for the following components:      Result Value   RBC 2.69 (*)    Hemoglobin 7.3 (*)    HCT 24.5 (*)    MCHC 29.8 (*)    RDW 21.8 (*)    Platelets 95 (*)    nRBC 1.3 (*)    All other components within normal limits  COMPREHENSIVE METABOLIC PANEL - Abnormal; Notable for the following components:   CO2 18 (*)    Creatinine, Ser 1.43 (*)    Calcium 8.1  (*)    Albumin 2.3 (*)    AST 124 (*)    ALT 50 (*)    Alkaline Phosphatase 144 (*)    GFR, Estimated 59 (*)    All other components within normal limits  ETHANOL - Abnormal; Notable for the following components:   Alcohol, Ethyl (B) 227 (*)    All other components within normal limits  LIPASE, BLOOD  OCCULT BLOOD X 1 CARD TO LAB, STOOL  CBG MONITORING, ED    EKG None  Radiology CT Head Wo Contrast  Result Date: 07/08/2022 CLINICAL DATA:  Head trauma EXAM: CT HEAD WITHOUT CONTRAST TECHNIQUE: Contiguous axial images were obtained from the base of the skull through the vertex without intravenous contrast. RADIATION DOSE REDUCTION: This exam was performed according to the departmental dose-optimization program which includes automated exposure control, adjustment of the mA and/or kV according to patient size and/or use of iterative reconstruction technique. COMPARISON:  Head CT 06/23/2019 FINDINGS: Brain: No evidence of acute infarction, hemorrhage, hydrocephalus, extra-axial collection or mass lesion/mass effect. Again seen is mild diffuse atrophy. There is stable mild periventricular white matter hypodensity, likely chronic small vessel ischemic change. Basal ganglia calcifications are again noted. Vascular: Atherosclerotic calcifications are present within the cavernous internal carotid arteries. Skull: Normal. Negative for fracture or focal lesion. Sinuses/Orbits: No acute finding. Other: None. IMPRESSION: 1. No acute intracranial process. 2. Stable mild atrophy and chronic small vessel ischemic changes. Electronically Signed   By: Darliss Cheney M.D.   On: 07/08/2022 17:29   CT Cervical Spine Wo Contrast  Result Date: 07/08/2022 CLINICAL DATA:  Trauma EXAM: CT CERVICAL SPINE WITHOUT CONTRAST TECHNIQUE: Multidetector CT imaging of the cervical spine was performed without intravenous contrast. Multiplanar CT image reconstructions were also generated. RADIATION DOSE REDUCTION: This exam was  performed according to the departmental dose-optimization program which includes automated exposure control, adjustment of the mA and/or kV according to patient size and/or use of iterative reconstruction technique. COMPARISON:  CT C SPine 06/23/19 FINDINGS: Alignment: Straightening of the normal cervical lordosis. Skull base and vertebrae: No acute fracture. No primary bone lesion or focal pathologic process. There are degenerative endplate changes at the inferior and superior endplates of C4 on C5. Soft tissues and spinal canal: No prevertebral fluid or swelling. No visible canal hematoma. Disc levels: There is likely at least mild spinal canal narrowing at C5-C6 secondary to a disc bulge. Upper chest: Negative. Other: None. IMPRESSION: No acute cervical spine fracture. Electronically Signed   By: Elige Radon.D.  On: 07/08/2022 17:28   DG Chest Portable 1 View  Result Date: 07/08/2022 CLINICAL DATA:  Weakness EXAM: PORTABLE CHEST 1 VIEW COMPARISON:  None Available. FINDINGS: Normal mediastinum and cardiac silhouette. Normal pulmonary vasculature. No evidence of effusion, infiltrate, or pneumothorax. No acute bony abnormality. IMPRESSION: No acute cardiopulmonary process. Electronically Signed   By: Genevive Bi M.D.   On: 07/08/2022 16:31    Procedures Procedures    Medications Ordered in ED Medications  lactated ringers bolus 1,000 mL (0 mLs Intravenous Stopped 07/08/22 1803)    ED Course/ Medical Decision Making/ A&P Clinical Course as of 07/08/22 1951  Wed Jul 08, 2022  1600 Check the patient's blood pressure [HS]  1600 Stable 52 YOM with GLF outside store.  Endorsed intoxication Slightly hypotensive today.  IVF and reassess/Workup pending at this time.  [CC]  1614 POC CBG, ED wnl [HS]  1657 DG Chest Portable 1 View No acute process  [HS]  1657 Ethanol(!) High  [HS]  1657 POC CBG, ED wnl [HS]  1703 CBC with Differential(!) Patient is notably very anemic with hemoglobin of  7.3.  Per chart review this has been downtrending over the last year. [HS]  1703 Lipase, blood wnl [HS]  1704 Comprehensive metabolic panel(!) New AKI, transaminitis roughly at baseline [HS]  1747 CT Head Wo Contrast Negative  [HS]  1747 CT Cervical Spine Wo Contrast Negative  [HS]  1818 Workup was discussed with the patient including my concerns about his anemia.  His blood pressure has been stable and improving while here in the ED, only 1 episode of significant hypotension otherwise within normal limits.  I am concerned about worsening anemia has never been worked up before.  Discussed with the patient would like to do a fecal occult and further evaluation, patient declined.  He states he has family obligations at home is seeing an orthopedic doctor later this week.  I discussed with the patient that I am concerned about worsening anemia that could be internal bleeding, colon cancer, emergent condition.  Patient is adamant he needs to get home.  He is able to verbalize understanding of the situation and risk, he is adamant he has a plan for follow-up.  Given that I do not think he would meet any IVC criteria and we will have him follow-up closely with his primary.  leaving AMA. [HS]    Clinical Course User Index [CC] Glyn Ade, MD [HS] Theron Arista, [place]                             Medical Decision Making Amount and/or Complexity of Data Reviewed Labs: ordered. Decision-making details documented in ED Course. Radiology: ordered. Decision-making details documented in ED Course.   This is a 52 year old male presenting to the emergency department due to alcohol intoxication and fall.  During triage she was found to be very hypotensive.  Differential includes but not limited to alcohol tox occasion, intracranial injury, gross lecture light derangement, AKI, symptomatic anemia, GI bleed, dehydration, sepsis.  On exam exam there were no physical signs that I can find trauma.  No  indication of basilar skull fracture.  He is following commands although slurring his words and endorses alcohol use prior to arrival.  Oriented x 3, follows commands.  He was very hypotensive but no SIRS criteria, but in room with cardiac monitoring showing sinus rhythm.  Will resuscitate with fluids, check labs and CT head and cervical spine.  I ordered, viewed and interpreted laboratory workup as documented ED course.  I reviewed imaging studies which are negative for any acute process, great radiologist. Notably, patient is very anemic with a hemoglobin of 7.3.  Per chart review this has been downtrending for the last few years, cutely low today.  He adamantly denies any rectal bleeding, melanic stool, change in bowel habits but I am concerned about potential for GI bleed versus colon cancer versus alternative etiology.    Hemoglobin  Date Value Ref Range Status  07/08/2022 7.3 (L) 13.0 - 17.0 g/dL Final  16/01/9603 8.6 (L) 13.0 - 17.7 g/dL Final  54/12/8117 9.1 (L) 13.2 - 17.1 g/dL Final  14/78/2956 21.3 (L) 13.0 - 17.7 g/dL Final  08/65/7846 96.2 (L) 13.0 - 17.0 g/dL Final  95/28/4132 44.0 (L) 13.0 - 17.7 g/dL Final  02/21/2535 9.7 (L) 13.0 - 17.7 g/dL Final    Workup discussed with the patient at length as documented ED course.  Patient is not in agreement to stay, he is clinically sober and able to make decisions.  I discussed that this is AGAINST MEDICAL ADVICE as I have concerns about blood loss, he is adamant he can follow-up with his primary and will do so but needs to get home to his children now.  I provided copy of the laboratory workup as well as lab abnormalities on his discharge paperwork.        Final Clinical Impression(s) / ED Diagnoses Final diagnoses:  Alcoholic intoxication without complication (HCC)  AKI (acute kidney injury) (HCC)  Anemia, unspecified type    Rx / DC Orders ED Discharge Orders     None         Theron Arista, PA-C 07/08/22 1952     Glyn Ade, MD 07/09/22 1455

## 2022-07-08 NOTE — ED Triage Notes (Signed)
[place] [employer]; called out by bystanders; pt speaks french; etoh on board; unsure how much, pt swaying, unsteady on feet; multiple falls while in store; did not hit head, no loc, not on thinners; denies head/neck//back ain; 108/50, P 86, 97% RA, cbg 95; pt has no complaints

## 2022-07-09 ENCOUNTER — Ambulatory Visit (INDEPENDENT_AMBULATORY_CARE_PROVIDER_SITE_OTHER): Payer: Medicaid Other | Admitting: Physical Medicine and Rehabilitation

## 2022-07-09 VITALS — BP 163/102 | HR 78

## 2022-07-09 DIAGNOSIS — D649 Anemia, unspecified: Secondary | ICD-10-CM

## 2022-07-09 DIAGNOSIS — M47816 Spondylosis without myelopathy or radiculopathy, lumbar region: Secondary | ICD-10-CM

## 2022-07-09 DIAGNOSIS — M48062 Spinal stenosis, lumbar region with neurogenic claudication: Secondary | ICD-10-CM

## 2022-07-09 DIAGNOSIS — M5416 Radiculopathy, lumbar region: Secondary | ICD-10-CM

## 2022-07-09 NOTE — Progress Notes (Signed)
Functional Pain Scale - descriptive words and definitions  Mild (2)   Noticeable when not distracted/no impact on ADL's/sleep only slightly affected and able to   use both passive and active distraction for comfort. Mild range order  Average Pain 6   +Driver, -BT, -Dye Allergies.  Lower back pain in the middle with no radiation

## 2022-07-10 ENCOUNTER — Other Ambulatory Visit: Payer: Self-pay

## 2022-07-10 ENCOUNTER — Encounter: Payer: Self-pay | Admitting: Physical Medicine and Rehabilitation

## 2022-07-10 ENCOUNTER — Emergency Department (HOSPITAL_COMMUNITY)
Admission: EM | Admit: 2022-07-10 | Discharge: 2022-07-10 | Disposition: A | Discharge status: Home / Self Care | Payer: Medicaid Other | Attending: Emergency Medicine | Admitting: Emergency Medicine

## 2022-07-10 ENCOUNTER — Encounter (HOSPITAL_COMMUNITY): Payer: Self-pay

## 2022-07-10 DIAGNOSIS — D649 Anemia, unspecified: Secondary | ICD-10-CM | POA: Insufficient documentation

## 2022-07-10 DIAGNOSIS — M549 Dorsalgia, unspecified: Secondary | ICD-10-CM | POA: Diagnosis present

## 2022-07-10 LAB — BASIC METABOLIC PANEL
Anion gap: 9 (ref 5–15)
BUN: 13 mg/dL (ref 6–20)
CO2: 22 mmol/L (ref 22–32)
Calcium: 8.7 mg/dL — ABNORMAL LOW (ref 8.9–10.3)
Chloride: 103 mmol/L (ref 98–111)
Creatinine, Ser: 0.8 mg/dL (ref 0.61–1.24)
GFR, Estimated: >60 mL/min (ref >60)
Glucose, Bld: 97 mg/dL (ref 70–99)
Potassium: 3.6 mmol/L (ref 3.5–5.1)
Sodium: 134 mmol/L — ABNORMAL LOW (ref 135–145)

## 2022-07-10 LAB — HEPATIC FUNCTION PANEL
ALT: 45 U/L — ABNORMAL HIGH (ref 0–44)
AST: 90 U/L — ABNORMAL HIGH (ref 15–41)
Albumin: 2.6 g/dL — ABNORMAL LOW (ref 3.5–5.0)
Alkaline Phosphatase: 136 U/L — ABNORMAL HIGH (ref 38–126)
Bilirubin, Direct: 0.5 mg/dL — ABNORMAL HIGH (ref 0.0–0.2)
Indirect Bilirubin: 1.3 mg/dL — ABNORMAL HIGH (ref 0.3–0.9)
Total Bilirubin: 1.8 mg/dL — ABNORMAL HIGH (ref 0.3–1.2)
Total Protein: 8.3 g/dL — ABNORMAL HIGH (ref 6.5–8.1)

## 2022-07-10 LAB — PROTIME-INR
INR: 1.6 — ABNORMAL HIGH (ref 0.8–1.2)
Prothrombin Time: 18.6 seconds — ABNORMAL HIGH (ref 11.4–15.2)

## 2022-07-10 LAB — CBC
HCT: 25.5 % — ABNORMAL LOW (ref 39.0–52.0)
Hemoglobin: 7.7 g/dL — ABNORMAL LOW (ref 13.0–17.0)
MCH: 28.3 pg (ref 26.0–34.0)
MCHC: 30.2 g/dL (ref 30.0–36.0)
MCV: 93.8 fL (ref 80.0–100.0)
Platelets: 90 10*3/uL — ABNORMAL LOW (ref 150–400)
RBC: 2.72 MIL/uL — ABNORMAL LOW (ref 4.22–5.81)
RDW: 23 % — ABNORMAL HIGH (ref 11.5–15.5)
WBC: 5.7 10*3/uL (ref 4.0–10.5)
nRBC: 0.4 % — ABNORMAL HIGH (ref 0.0–0.2)

## 2022-07-10 LAB — VITAMIN B12: Vitamin B-12: 487 pg/mL (ref 180–914)

## 2022-07-10 LAB — FERRITIN: Ferritin: 21 ng/mL — ABNORMAL LOW (ref 24–336)

## 2022-07-10 LAB — RETICULOCYTES
Immature Retic Fract: 46.7 % — ABNORMAL HIGH (ref 2.3–15.9)
RBC.: 2.69 MIL/uL — ABNORMAL LOW (ref 4.22–5.81)
Retic Count, Absolute: 129.1 10*3/uL (ref 19.0–186.0)
Retic Ct Pct: 4.8 % — ABNORMAL HIGH (ref 0.4–3.1)

## 2022-07-10 LAB — FOLATE: Folate: 11.3 ng/mL (ref >5.9)

## 2022-07-10 LAB — IRON AND TIBC
Iron: 32 ug/dL — ABNORMAL LOW (ref 45–182)
Saturation Ratios: 6 % — ABNORMAL LOW (ref 17.9–39.5)
TIBC: 515 ug/dL — ABNORMAL HIGH (ref 250–450)
UIBC: 483 ug/dL

## 2022-07-10 LAB — POC OCCULT BLOOD, ED: Fecal Occult Bld: NEGATIVE

## 2022-07-10 MED ORDER — FERROUS SULFATE 325 (65 FE) MG PO TABS: 325.0000 mg | ORAL_TABLET | Freq: Every day | ORAL | 0 refills | Status: DC

## 2022-07-10 NOTE — Discharge Instructions (Addendum)
Your blood count today is low but improved from 2 days ago.  You are being started on iron which can help bring up your blood stores.  You need to get your blood counts rechecked next week from your family doctor.  If you develop fever, trouble breathing, chest pain, dizziness, fatigue, or any other new/concerning symptoms then return to the ER or call 911.  Votre numration globulaire est faible aujourd'hui, mais elle s'est amliore par rapport  il y a 2 jours. Vous commencez  prendre Systems developer, ce qui peut vous aider  augmenter vos rserves sanguines. Vous devrez Editor, commissioning formule sanguine la semaine prochaine auprs de votre mdecin de famille.  Si vous dveloppez de la fivre, des difficults respiratoires, des douleurs thoraciques, des tourdissements, de la fatigue ou tout autre symptme nouveau/proccupant, retournez aux urgences ou Building surveyor 911.

## 2022-07-10 NOTE — Congregational Nurse Program (Signed)
  Dept: [phone]   Congregational Nurse Program Note  Date of Encounter: 07/07/2022  Past Medical History: Past Medical History:  Diagnosis Date   Gout 12/30/2017    Encounter Details:  CNP Questionnaire - 07/07/22 0900       Questionnaire   Ask client: Do you give verbal consent for me to treat you today? Yes    Student Assistance N/A    Location Patient [place]    Visit Setting with Client Home    Patient Status Migrant    Insurance Medicaid    Insurance/Financial Assistance Referral N/A    Medication Have Medication Insecurities    Medical Provider Yes    Screening Referrals Made N/A    Medical Referrals Made N/A    Medical Appointment Made N/A    Recently w/o PCP, now 1st time PCP visit completed due to CNs referral or appointment made N/A    Food Have Food Insecurities    Transportation Need transportation assistance;Provided transportation assistance    Housing/Utilities No permanent housing    Interpersonal Safety N/A    Interventions Advocate/Support;Counsel;Educate;Navigate Healthcare System    Abnormal to Normal Screening Since Last CN Visit N/A    Screenings CN Performed N/A    Sent Client to Lab for: N/A    Did client attend any of the following based off CNs referral or appointments made? N/A    ED Visit Averted Yes    Life-Saving Intervention Made N/A            Accompanied client to food stamp services to begin food stamp renewal process.  Application was received by client.  Accompanied client on several errands today to get his bus pass, pay for his phone.  Discussed with client all upcoming appointments and went over addresses for each scheduled appointment.  Will follow up with this CN as needed.  Karene Fry, RN, MSN, [place] [facility] [phone] Cell

## 2022-07-10 NOTE — Progress Notes (Signed)
Brandon Arias - 52 y.o. male MRN [id number]  Date of birth: January 29, 1971  Office Visit Note: Visit Date: 07/09/2022 PCP: Elsie Stain, MD Referred by: Callie Fielding, MD  Subjective: Chief Complaint  Patient presents with   Lower Back - Pain   HPI: Brandon Arias is a 52 y.o. male who comes in today for evaluation and management at the request of Dr. Ileene Rubens for chronic worsening severe low back pain with bilateral radicular leg pain.  Patient has known lumbar stenosis severe at L4-5.  Pretty consistent symptoms of neurogenic claudication.  No focal weakness.  Has really failed conservative care with activity modification and medications.  No prior injections or surgery.  No history of diabetes but does have history of gout.  He has not reported any focal weakness.  There is an interpreter present today.  We did note that he was in the emergency department yesterday for evaluation after a fall where he felt dizzy and fell down.  He was noted in the emergency department to have had alcohol with alcohol intoxication.  More concerning was his lab values were essentially almost all abnormal.  The biggest factor was his hemoglobin has steadily declined over the last several months and is down to 7.3.  I discussed this at length with him today and did advise him through the interpreter that he really needs to go back to the emergency department to have this evaluated as he may have some type of internal bleeding or some type of process or is not producing blood cells.  He understands this and is going to go back to the emergency department.    Review of Systems  Constitutional:  Positive for malaise/fatigue.  Cardiovascular:  Negative for chest pain and leg swelling.  Musculoskeletal:  Positive for back pain.  Neurological:  Positive for tingling.  All other systems reviewed and are negative.  Otherwise per HPI.  Assessment & Plan: Visit Diagnoses:    ICD-10-CM   1. Spinal  stenosis of lumbar region with neurogenic claudication  M48.062     2. Lumbar radiculopathy  M54.16     3. Spondylosis without myelopathy or radiculopathy, lumbar region  M47.816     4. Anemia, unspecified type  D64.9        Plan: Findings:  1.  Progressive anemia that needs further workup at the emergency department.  He does feel tired with some orthostatic type issues and he had a fall.  Today his blood pressure was good and he was not having any orthostatic intolerance.  Really a lot of his lab values are abnormal and he really needs to be looked at the emergency department probably admitted to have this further looked at.  I really made it clear to him that this is what needed to happen for his health.  He did understand this and endorse that he will go back to the emergency department.  He was in no imminent health risk in the clinic and this was not an emergent situation.  2.  Lumbar stenosis with low back pain and radicular leg pain consistent with neurogenic claudication.  I think in the future depending on his workup for the anemia and lab value discrepancies he would be a good candidate for transforaminal epidural steroid injection at L4-5 versus eventually potential for decompression laminectomy.    Meds & Orders: No orders of the defined types were placed in this encounter.  No orders of the defined types were placed in this  encounter.   Follow-up: No follow-ups on file.   Procedures: No procedures performed      Clinical History: No specialty comments available.   He reports that he has never smoked. He has never been exposed to tobacco smoke. He has never used smokeless tobacco.  Recent Labs    03/06/22 1116  LABURIC 10.3*    Objective:  VS:  HT:    WT:   BMI:     BP:(!) 163/102  HR:78bpm  TEMP: ( )  RESP:  Physical Exam Vitals and nursing note reviewed.  Constitutional:      General: He is not in acute distress.    Appearance: Normal appearance. He is  well-developed.  HENT:     Head: Normocephalic and atraumatic.  Eyes:     Conjunctiva/sclera: Conjunctivae normal.     Pupils: Pupils are equal, round, and reactive to light.  Cardiovascular:     Rate and Rhythm: Normal rate.     Pulses: Normal pulses.     Heart sounds: Normal heart sounds.  Pulmonary:     Effort: Pulmonary effort is normal. No respiratory distress.  Musculoskeletal:     Cervical back: Normal range of motion and neck supple. No rigidity.     Right lower leg: No edema.     Left lower leg: No edema.     Comments: He ambulates without aid.  He has good orthostatic tolerance he does have pain with standing and extension.  No pain with hip rotation good distal strength.  Skin:    General: Skin is warm and dry.     Findings: No erythema or rash.  Neurological:     General: No focal deficit present.     Mental Status: He is alert and oriented to person, place, and time.     Sensory: No sensory deficit.     Coordination: Coordination normal.     Gait: Gait normal.  Psychiatric:        Mood and Affect: Mood normal.        Behavior: Behavior normal.     Ortho Exam  Imaging: No results found.  Past Medical/Family/Surgical/Social History: Medications & Allergies reviewed per EMR, new medications updated. 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 No pertinent family history. Past Surgical History:  Procedure Laterality Date   no known surgeries     Social History   Occupational History   Not on file  Tobacco Use   Smoking status: Never    Passive exposure: Never   Smokeless tobacco: Never  Vaping Use   Vaping Use: Never used  Substance  and Sexual Activity   Alcohol use: Yes   Drug use: Never   Sexual activity: Not Currently

## 2022-07-10 NOTE — Congregational Nurse Program (Signed)
  Dept: [phone]   Congregational Nurse Program Note  Date of Encounter: 07/06/2022  Past Medical History: Past Medical History:  Diagnosis Date   Gout 12/30/2017    Encounter Details:  CNP Questionnaire - 07/06/22 0900       Questionnaire   Ask client: Do you give verbal consent for me to treat you today? Yes    Student Assistance N/A    Location Patient [facility]    Visit Setting with Client Home    Patient Status Migrant    Insurance Medicaid    Insurance/Financial Assistance Referral N/A    Medication Have Medication Insecurities    Medical Provider Yes    Screening Referrals Made N/A    Medical Referrals Made N/A    Medical Appointment Made N/A    Recently w/o PCP, now 1st time PCP visit completed due to CNs referral or appointment made N/A    Food Have Food Insecurities    Transportation Need transportation assistance;Provided transportation assistance    Housing/Utilities No permanent housing    Interpersonal Safety N/A    Interventions Advocate/Support;Counsel;Educate;Navigate Healthcare System    Abnormal to Normal Screening Since Last CN Visit N/A    Screenings CN Performed N/A    Sent Client to Lab for: N/A    Did client attend any of the following based off CNs referral or appointments made? N/A    ED Visit Averted Yes    Life-Saving Intervention Made N/A            Transportation provided to client for appointment this morning at 10:00 am to and from appointment.  Client will follow up with this CN as needed.

## 2022-07-10 NOTE — ED Triage Notes (Signed)
Pt came in via POV as recommended by his doctor d/t his "blood being low" when labs were checked here in the ED on Wednesday (07/08/22). He was supposed to have a surgery on a "hole in his spine" but was told he needed to come get blood before the surgery can be performed. Pt endorses nausea & dizziness, unable to rate his pain level while win triage.

## 2022-07-10 NOTE — ED Provider Notes (Signed)
[facility] Provider Note   CSN: [id number] Arrival date & time: 07/10/22  0818     History  Chief Complaint  Patient presents with   Low Hgb   Back Pain    Brandon Arias is a 52 y.o. male.  HPI 52 year old male presents as with anemia.  History is taken with the [place] interpreter. He was recommended to come in due to anemia seen on blood work 2 days ago.  Patient had to come to the ER after a fall while drinking alcohol.  He states he was celebrating his birthday.  He denies drinking every day.  He denies any acute dizziness except when he drank too much alcohol.  He has chronic back pain and he saw physician yesterday who recommended he come back to the ER for possible blood transfusion due to the low blood counts.  He denies any blood in his stool though the day he drank alcohol he saw some black stool but that is no longer present.  He denies that he is currently on iron at this time.  He denies chest pain, dizziness, shortness of breath, abdominal pain or acute bleeding.  Home Medications Prior to Admission medications   Medication Sig Start Date End Date Taking? Authorizing Provider  ferrous sulfate 325 (65 FE) MG tablet Take 1 tablet (325 mg total) by mouth daily. 07/10/22  Yes Sherwood Gambler, MD  amLODipine (NORVASC) 10 MG tablet Take 1 tablet (10 mg total) by mouth daily. 05/06/22   Elsie Stain, MD  colchicine 0.6 MG tablet Take 2 tablet by mouth daily for a gout flare. 05/06/22   Elsie Stain, MD  cyclobenzaprine (FLEXERIL) 10 MG tablet Take 1 tablet (10 mg total) by mouth 3 (three) times daily as needed for muscle spasms. Patient not taking: Reported on 03/06/2022 11/10/21   Elsie Stain, MD  indomethacin (INDOCIN) 25 MG capsule Take 1 capsule (25 mg total) by mouth 3 (three) times daily with meals. Until bottle empty and STOP 05/06/22   Elsie Stain, MD  polyethylene glycol powder (GLYCOLAX/MIRALAX) 17  GM/SCOOP powder Take 17 g by mouth daily. Patient not taking: Reported on 05/06/2022 03/17/22   Charlott Rakes, MD  valsartan (DIOVAN) 160 MG tablet Take 1 tablet (160 mg total) by mouth daily. 05/06/22   Elsie Stain, MD      Allergies    Patient has no known allergies.    Review of Systems   Review of Systems  Constitutional:  Negative for fatigue.  Respiratory:  Negative for shortness of breath.   Cardiovascular:  Negative for chest pain.  Gastrointestinal:  Negative for abdominal pain and blood in stool.  Neurological:  Negative for dizziness and light-headedness.    Physical Exam Updated Vital Signs BP (!) 137/90   Pulse 72   Temp 98.6 F (37 C) (Oral)   Resp 18   Ht 5\' 8"  (1.727 m)   Wt 72 kg   SpO2 100%   BMI 24.14 kg/m  Physical Exam Vitals and nursing note reviewed. Exam conducted with a chaperone present.  Constitutional:      General: He is not in acute distress.    Appearance: He is well-developed. He is not ill-appearing or diaphoretic.  HENT:     Head: Normocephalic and atraumatic.  Cardiovascular:     Rate and Rhythm: Normal rate and regular rhythm.     Comments: Questionable soft systolic murmur Pulmonary:     Effort:  Pulmonary effort is normal.     Breath sounds: Normal breath sounds.  Abdominal:     Palpations: Abdomen is soft.     Tenderness: There is no abdominal tenderness.  Genitourinary:    Rectum: Guaiac result negative.     Comments: Light brown stool on DRE Skin:    General: Skin is warm and dry.  Neurological:     Mental Status: He is alert.     ED Results / Procedures / Treatments   Labs (all labs ordered are listed, but only abnormal results are displayed) Labs Reviewed  CBC - Abnormal; Notable for the following components:      Result Value   RBC 2.72 (*)    Hemoglobin 7.7 (*)    HCT 25.5 (*)    RDW 23.0 (*)    Platelets 90 (*)    nRBC 0.4 (*)    All other components within normal limits  BASIC METABOLIC PANEL -  Abnormal; Notable for the following components:   Sodium 134 (*)    Calcium 8.7 (*)    All other components within normal limits  RETICULOCYTES - Abnormal; Notable for the following components:   Retic Ct Pct 4.8 (*)    RBC. 2.69 (*)    Immature Retic Fract 46.7 (*)    All other components within normal limits  HEPATIC FUNCTION PANEL - Abnormal; Notable for the following components:   Total Protein 8.3 (*)    Albumin 2.6 (*)    AST 90 (*)    ALT 45 (*)    Alkaline Phosphatase 136 (*)    Total Bilirubin 1.8 (*)    Bilirubin, Direct 0.5 (*)    Indirect Bilirubin 1.3 (*)    All other components within normal limits  PROTIME-INR - Abnormal; Notable for the following components:   Prothrombin Time 18.6 (*)    INR 1.6 (*)    All other components within normal limits  VITAMIN B12  FOLATE  IRON AND TIBC  FERRITIN  POC OCCULT BLOOD, ED    EKG None  Radiology CT Head Wo Contrast  Result Date: 07/08/2022 CLINICAL DATA:  Head trauma EXAM: CT HEAD WITHOUT CONTRAST TECHNIQUE: Contiguous axial images were obtained from the base of the skull through the vertex without intravenous contrast. RADIATION DOSE REDUCTION: This exam was performed according to the departmental dose-optimization program which includes automated exposure control, adjustment of the mA and/or kV according to patient size and/or use of iterative reconstruction technique. COMPARISON:  Head CT 06/23/2019 FINDINGS: Brain: No evidence of acute infarction, hemorrhage, hydrocephalus, extra-axial collection or mass lesion/mass effect. Again seen is mild diffuse atrophy. There is stable mild periventricular white matter hypodensity, likely chronic small vessel ischemic change. Basal ganglia calcifications are again noted. Vascular: Atherosclerotic calcifications are present within the cavernous internal carotid arteries. Skull: Normal. Negative for fracture or focal lesion. Sinuses/Orbits: No acute finding. Other: None. IMPRESSION: 1.  No acute intracranial process. 2. Stable mild atrophy and chronic small vessel ischemic changes. Electronically Signed   By: Ronney Asters M.D.   On: 07/08/2022 17:29   CT Cervical Spine Wo Contrast  Result Date: 07/08/2022 CLINICAL DATA:  Trauma EXAM: CT CERVICAL SPINE WITHOUT CONTRAST TECHNIQUE: Multidetector CT imaging of the cervical spine was performed without intravenous contrast. Multiplanar CT image reconstructions were also generated. RADIATION DOSE REDUCTION: This exam was performed according to the departmental dose-optimization program which includes automated exposure control, adjustment of the mA and/or kV according to patient size and/or use of iterative  reconstruction technique. COMPARISON:  CT C SPine 06/23/19 FINDINGS: Alignment: Straightening of the normal cervical lordosis. Skull base and vertebrae: No acute fracture. No primary bone lesion or focal pathologic process. There are degenerative endplate changes at the inferior and superior endplates of C4 on C5. Soft tissues and spinal canal: No prevertebral fluid or swelling. No visible canal hematoma. Disc levels: There is likely at least mild spinal canal narrowing at C5-C6 secondary to a disc bulge. Upper chest: Negative. Other: None. IMPRESSION: No acute cervical spine fracture. Electronically Signed   By: Marin Roberts M.D.   On: 07/08/2022 17:28   DG Chest Portable 1 View  Result Date: 07/08/2022 CLINICAL DATA:  Weakness EXAM: PORTABLE CHEST 1 VIEW COMPARISON:  None Available. FINDINGS: Normal mediastinum and cardiac silhouette. Normal pulmonary vasculature. No evidence of effusion, infiltrate, or pneumothorax. No acute bony abnormality. IMPRESSION: No acute cardiopulmonary process. Electronically Signed   By: Suzy Bouchard M.D.   On: 07/08/2022 16:31    Procedures Procedures    Medications Ordered in ED Medications - No data to display  ED Course/ Medical Decision Making/ A&P                             Medical  Decision Making Amount and/or Complexity of Data Reviewed External Data Reviewed: notes.    Details: PCP notes.  Is supposed to be on iron. Labs: ordered.    Details: Hemoglobin 7.7 which is increased from a couple days ago of 7.3.  Normal creatinine and resolved AKI.  Risk OTC drugs.   Patient presents with anemia.  He was told to come here for possible blood transfusion.  However his hemoglobin is increased a couple points since a couple days ago and he is having no acute GI bleeding or other bleeding symptoms.  Occult blood test is negative with normal-appearing stool.  He is completely asymptomatic from a anemia perspective.  He has had some downtrending hemoglobins over several months but is unclear of what time course this has happened.  He has not been eating much red meat as he was told to avoid this due to his gout which might be contributing.  Also discussed through the interpreter that the alcohol abuse is probably not helping.  Will start on iron but I do not see an indication for a blood transfusion and I do not see an indication for an admission at this point as I think this can be worked up and treated as an outpatient, especially with an improving hemoglobin and stable vitals.  Discussed this through the interpreter.  Will give return precautions and a iron prescription.        Final Clinical Impression(s) / ED Diagnoses Final diagnoses:  Anemia, unspecified type    Rx / DC Orders ED Discharge Orders          Ordered    ferrous sulfate 325 (65 FE) MG tablet  Daily        07/10/22 1334              Sherwood Gambler, MD 07/10/22 1343

## 2022-07-28 ENCOUNTER — Telehealth: Payer: Self-pay | Admitting: Critical Care Medicine

## 2022-07-28 NOTE — Telephone Encounter (Signed)
Pt was calling for lab results and upcoming appt. Advised pt of labs from hospital on 07/10/22 and pt has upcoming appt on 08/14/22. Advised pt to keep that appt and provider can go over results more in detail. No further assistance needed.

## 2022-07-29 ENCOUNTER — Ambulatory Visit (INDEPENDENT_AMBULATORY_CARE_PROVIDER_SITE_OTHER): Payer: Medicaid Other | Admitting: Orthopedic Surgery

## 2022-07-29 DIAGNOSIS — M5416 Radiculopathy, lumbar region: Secondary | ICD-10-CM

## 2022-07-29 NOTE — Progress Notes (Signed)
Orthopedic Spine Surgery Office Note  Assessment: Patient is a 52 y.o. male with low back pain that radiates into bilateral buttocks.  His back and buttock pain is gotten better with time but has not complete resolved   Plan: -Explained that initially conservative treatment is tried as a significant number of patients may experience relief with these treatment modalities. Discussed that the conservative treatments include:  -activity modification  -physical therapy  -over the counter pain medications  -medrol dosepak  -lumbar steroid injections -Patient has tried over-the-counter medications, narcotics, indomethacin, Tylenol  -Since he has been getting better with over-the-counter medications, I told him to continue to take those -Most recent hemoglobin was 7.7.  Encouraged him to see his PCP to continue with the workup and trend for improvement.  If his hemoglobin improves, will refer him for injection for diagnostic and therapeutic purposes -Patient should return to office in 12 weeks, x-rays at next visit: none   Patient expressed understanding of the plan and all questions were answered to the patient's satisfaction.   ___________________________________________________________________________  History: Patient is a 52 y.o. male who has been previously seen in the office for symptoms of low back pain radiating to bilateral buttocks that improved when he was in a seated position.  Patient is still having low back pain radiating to bilateral buttocks.  He states that it has gotten better since last time I saw him.  It is not completely resolved but is more tolerable.  He notices the pain on a daily basis.  It does not radiate past the buttock on either side.  No bowel or bladder incontinence.  No saddle anesthesia.  Previous treatments: over-the-counter medications, narcotics, indomethacin, Tylenol   Physical Exam:  General: no acute distress, appears stated age Neurologic: alert,  answering questions appropriately, following commands Respiratory: unlabored breathing on room air, symmetric chest rise Psychiatric: appropriate affect, normal cadence to speech   MSK (spine):   -Strength exam                                                   Left                  Right EHL                              5/5                  5/5 TA                                 5/5                  5/5 GSC                             5/5                  5/5 Knee extension            5/5                  5/5 Hip flexion  5/5                  5/5   -Sensory exam                           Sensation intact to light touch in L3-S1 nerve distributions of bilateral lower extremities   -Achilles DTR: 1/4 on the left, 1/4 on the right -Patellar tendon DTR: 1/4 on the left, 1/4 on the right   -Straight leg raise: Negative -Contralateral straight leg raise: Negative -Femoral nerve stretch test: Negative bilaterally -Clonus: no beats bilaterally   -Left hip exam: No pain through range of motion, negative Stinchfield, negative FABER -Right hip exam: No pain through range of motion, negative Stinchfield, negative FABER  Imaging: XR of the lumbar spine from 06/16/2022 was previously independently reviewed and interpreted, showing disc height loss at L4/5. No fracture or dislocation. No evidence of instability on flexion/extension.    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   Patient name: Brandon Arias Patient MRN: [id number] Date of visit: 07/29/22

## 2022-07-30 ENCOUNTER — Telehealth: Payer: Self-pay | Admitting: Critical Care Medicine

## 2022-07-30 NOTE — Telephone Encounter (Signed)
Patient has not been seen in some time he no showed appointment  He has an appointment April 19 and he will keep that appointment I cannot return to work until he is seen

## 2022-07-30 NOTE — Telephone Encounter (Signed)
Patient came into the office requesting to return back to work saying that he wants to apply for another job.

## 2022-07-31 NOTE — Telephone Encounter (Signed)
Called patient and he is aware of this   Interpreter [phone]

## 2022-08-14 ENCOUNTER — Encounter: Payer: Medicaid Other | Admitting: Nurse Practitioner

## 2022-08-14 NOTE — Progress Notes (Signed)
No show

## 2022-08-18 ENCOUNTER — Ambulatory Visit: Payer: Medicaid Other | Admitting: Family Medicine

## 2022-10-17 ENCOUNTER — Telehealth: Payer: Self-pay | Admitting: Critical Care Medicine

## 2022-10-17 ENCOUNTER — Ambulatory Visit: Payer: Medicaid Other | Admitting: Family

## 2022-10-17 NOTE — Telephone Encounter (Signed)
Called pt using interpreter services; pt did not understand appt reminder message from [facility] and got appt date mixed up. Rescheduled pt with pcp; has issues with transportation and will try to make it for appt booked on 06/27.

## 2022-10-22 ENCOUNTER — Ambulatory Visit: Payer: Medicaid Other | Admitting: Critical Care Medicine

## 2022-10-22 NOTE — Progress Notes (Deleted)
Established Patient Office Visit  Subjective:  Patient ID: Brandon Arias, male    DOB: 1970/12/30  Age: 52 y.o. MRN: [id number]  CC:  Primary care follow-up  HPI  05/2021 Hasson Gaspard presents for evaluation of right lower back pain which the patient subscribes to being his kidney.  This visit was assisted with [place] interpreter Renae Fickle [phone] on video  Patient originally is from [place] and was brought to the clinic under the guidance of the Congregational and community nurse program.  The patient has had 3 COVID vaccinations on file.  Patient has a history of tophaceous gout involving both elbows for which she has had multiple encounters in urgent care emergency room and other providers.  The patient is due colon cancer screening.  He does now have Medicaid.  Patient does drink alcohol with various parties and birthday events on weekends.  He has been in the emergency room for this previously with elevated liver function tests.  He now complains of difficulty with balance and carrying objects he is not able to work.  He has severe pain in the lower back which results in difficulty standing and sitting.  He also has gout flares in the elbows and states the indomethacin has been given and the low-dose allopurinol has not been of any benefit.  He does have a very high uric acid level but no renal insufficiency.  He also is requesting some help in his home environment.  He has been on prednisone Indocin and Allender Purinol along with methocarbamol without much relief at this time.  Patient has no other complaints.  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  The patient has since last being seen here has been evaluated by spinal surgeon who is planning spinal surgery in the next 2 months.  He is yet to get him to rheumatology because of his tophaceous gout Patient still has elbow pain.  He also did not pick up his allopurinol.  He has been using the meloxicam.  On  arrival blood pressure is good 130/85.  He has been using meloxicam and the Flexeril. The patient has stopped drinking alcohol.  He is less anxious at this visit compared to prior visits.  7/17 This patient is seen in return visit and visit was assisted with video [place] interpreter Christella [phone]  On arrival blood pressure elevated 150/90.  Patient still has chronic back pain was supposed of had surgery but has been postponed till November.  Patient was positive seeing rheumatology for his tophaceous gout however he did not respond when attempts to make appointment was made.  I will need to get his nurse case manager to assist in this in the Congregational nurse program.  Patient needs a letter stating he cannot work due to his back pain.  We did do a TB screen at the last visit it was negative.  05/06/22 Patient seen in return follow-up I have not seen him since July 23 but he was seen in November by clinical pharmacy and my partner Dr. Alvis Lemmings.  He has had problems with abdominal complaints with constipation.  The patient also has continued low back pain and today comes in with severely swollen left elbow.  He has tophaceous gout but it appears he is having a gout flare in the olecranon bursa of the left elbow.  Note this visit was assisted by a [place] interpreter Latifa [phone].  He had a liver ultrasound scheduled but has yet to complete this.  He has  had elevated liver enzymes and has been on allopurinol.  He has been followed by rheumatology but did not follow-up with him.  They started him on allopurinol with a titrating dose up they wanted him to come back for labs he did not return.  He was seen by primary care here in November had labs done showing elevated enzymes is still taking the allopurinol.  He also takes colchicine.  10/22/22 Power chair assessment  Past Medical History:  Diagnosis Date   Gout 12/30/2017    Past Surgical History:  Procedure Laterality Date   no known surgeries       No family history on file.  Social History   Socioeconomic History   Marital status: Married    Spouse name: Not on file   Number of children: Not on file   Years of education: Not on file   Highest education level: Not on file  Occupational History   Not on file  Tobacco Use   Smoking status: Never    Passive exposure: Never   Smokeless tobacco: Never  Vaping Use   Vaping Use: Never used  Substance and Sexual Activity   Alcohol use: Yes   Drug use: Never   Sexual activity: Not Currently  Other Topics Concern   Not on file  Social History Narrative   Not on file   Social Determinants of Health   Financial Resource Strain: Not on file  Food Insecurity: Not on file  Transportation Needs: Not on file  Physical Activity: Not on file  Stress: Not on file  Social Connections: Not on file  Intimate Partner Violence: Not on file    Outpatient Medications Prior to Visit  Medication Sig Dispense Refill   amLODipine (NORVASC) 10 MG tablet Take 1 tablet (10 mg total) by mouth daily. 90 tablet 1   colchicine 0.6 MG tablet Take 2 tablet by mouth daily for a gout flare. 60 tablet 2   cyclobenzaprine (FLEXERIL) 10 MG tablet Take 1 tablet (10 mg total) by mouth 3 (three) times daily as needed for muscle spasms. (Patient not taking: Reported on 03/06/2022) 90 tablet 2   ferrous sulfate 325 (65 FE) MG tablet Take 1 tablet (325 mg total) by mouth daily. 30 tablet 0   indomethacin (INDOCIN) 25 MG capsule Take 1 capsule (25 mg total) by mouth 3 (three) times daily with meals. Until bottle empty and STOP 60 capsule 0   polyethylene glycol powder (GLYCOLAX/MIRALAX) 17 GM/SCOOP powder Take 17 g by mouth daily. (Patient not taking: Reported on 05/06/2022) 3350 g 1   valsartan (DIOVAN) 160 MG tablet Take 1 tablet (160 mg total) by mouth daily. 90 tablet 3   No facility-administered medications prior to visit.    No Known Allergies  ROS Review of Systems  Constitutional:  Negative  for chills, diaphoresis and fever.  HENT:  Negative for congestion, hearing loss, nosebleeds, sore throat and tinnitus.   Eyes:  Negative for photophobia and redness.  Respiratory:  Negative for cough, shortness of breath, wheezing and stridor.   Cardiovascular:  Negative for chest pain, palpitations and leg swelling.  Gastrointestinal:  Positive for constipation. Negative for abdominal pain, blood in stool, diarrhea, nausea and vomiting.  Endocrine: Negative for polydipsia.  Genitourinary:  Negative for dysuria, flank pain, frequency, hematuria and urgency.  Musculoskeletal:  Positive for arthralgias, back pain, gait problem and joint swelling. Negative for myalgias and neck pain.  Skin:  Negative for rash.  Allergic/Immunologic: Negative for environmental allergies.  Neurological:  Negative for dizziness, tremors, seizures, weakness and headaches.  Hematological:  Does not bruise/bleed easily.  Psychiatric/Behavioral:  Negative for dysphoric mood and suicidal ideas. The patient is not nervous/anxious.       Objective:    Physical Exam Vitals reviewed.  Constitutional:      General: He is not in acute distress.    Appearance: Normal appearance. He is well-developed. He is not ill-appearing, toxic-appearing or diaphoretic.     Comments: Significant push of speech somewhat angry at times restless  HENT:     Head: Normocephalic and atraumatic.     Nose: Nose normal. No nasal deformity, septal deviation, mucosal edema or rhinorrhea.     Right Sinus: No maxillary sinus tenderness or frontal sinus tenderness.     Left Sinus: No maxillary sinus tenderness or frontal sinus tenderness.     Mouth/Throat:     Mouth: Mucous membranes are moist.     Pharynx: Oropharynx is clear. No oropharyngeal exudate.     Comments: Mild dental caries Eyes:     General: No scleral icterus.    Conjunctiva/sclera: Conjunctivae normal.     Pupils: Pupils are equal, round, and reactive to light.  Neck:      Thyroid: No thyromegaly.     Vascular: No carotid bruit or JVD.     Trachea: Trachea normal. No tracheal tenderness or tracheal deviation.  Cardiovascular:     Rate and Rhythm: Normal rate and regular rhythm.     Chest Wall: PMI is not displaced.     Pulses: Normal pulses. No decreased pulses.     Heart sounds: Normal heart sounds, S1 normal and S2 normal. Heart sounds not distant. No murmur heard.    No systolic murmur is present.     No diastolic murmur is present.     No friction rub. No gallop. No S3 or S4 sounds.  Pulmonary:     Effort: No tachypnea, accessory muscle usage or respiratory distress.     Breath sounds: No stridor. No decreased breath sounds, wheezing, rhonchi or rales.  Chest:     Chest wall: No tenderness.  Abdominal:     General: Bowel sounds are normal. There is no distension.     Palpations: Abdomen is soft. Abdomen is not rigid.     Tenderness: There is no abdominal tenderness. There is no guarding or rebound.  Musculoskeletal:        General: Tenderness present. No swelling or deformity. Normal range of motion.     Cervical back: Normal range of motion and neck supple. No edema, erythema or rigidity. No muscular tenderness. Normal range of motion.     Right lower leg: No edema.     Left lower leg: No edema.     Comments: Point tenderness in the left lower back with reproducible pain.  The patient is neurologically intact in the lower extremities.  Significantly inflamed tophaceous gout left elbow olecranon bursa  Lymphadenopathy:     Head:     Right side of head: No submental or submandibular adenopathy.     Left side of head: No submental or submandibular adenopathy.     Cervical: No cervical adenopathy.  Skin:    General: Skin is warm and dry.     Coloration: Skin is not pale.     Findings: No rash.     Nails: There is no clubbing.  Neurological:     General: No focal deficit present.     Mental Status: He  is alert and oriented to person, place, and  time. Mental status is at baseline.     Cranial Nerves: No cranial nerve deficit.     Motor: No weakness.     Coordination: Coordination normal.     Gait: Gait normal.     Deep Tendon Reflexes: Reflexes normal.     Comments: Patient has extreme difficulty arising from a sitting position in the chair.  Psychiatric:        Mood and Affect: Mood normal.        Speech: Speech normal.        Behavior: Behavior normal.        Thought Content: Thought content normal.        Judgment: Judgment normal.     There were no vitals taken for this visit. Wt Readings from Last 3 Encounters:  07/10/22 158 lb 11.7 oz (72 kg)  06/16/22 158 lb (71.7 kg)  05/06/22 157 lb 6.4 oz (71.4 kg)     Health Maintenance Due  Topic Date Due   COVID-19 Vaccine (1) Never done    There are no preventive care reminders to display for this patient.  Lab Results  Component Value Date   TSH 0.714 12/31/2020   Lab Results  Component Value Date   WBC 5.7 07/10/2022   HGB 7.7 (L) 07/10/2022   HCT 25.5 (L) 07/10/2022   MCV 93.8 07/10/2022   PLT 90 (L) 07/10/2022   Lab Results  Component Value Date   NA 134 (L) 07/10/2022   K 3.6 07/10/2022   CO2 22 07/10/2022   GLUCOSE 97 07/10/2022   BUN 13 07/10/2022   CREATININE 0.80 07/10/2022   BILITOT 1.8 (H) 07/10/2022   ALKPHOS 136 (H) 07/10/2022   AST 90 (H) 07/10/2022   ALT 45 (H) 07/10/2022   PROT 8.3 (H) 07/10/2022   ALBUMIN 2.6 (L) 07/10/2022   CALCIUM 8.7 (L) 07/10/2022   ANIONGAP 9 07/10/2022   EGFR 110 05/06/2022   Lab Results  Component Value Date   CHOL 78 (L) 12/31/2020   Lab Results  Component Value Date   HDL 30 (L) 12/31/2020   Lab Results  Component Value Date   LDLCALC 34 12/31/2020   Lab Results  Component Value Date   TRIG 58 12/31/2020   Lab Results  Component Value Date   CHOLHDL 2.6 12/31/2020   No results found for: "HGBA1C" 01/2021 MRI Lumbar: IMPRESSION: 1. At L4-5 there is a broad-based disc bulge. Mild  bilateral facet arthropathy. Severe spinal stenosis. Mild bilateral foraminal stenosis. Bilateral subarticular recess stenosis. 2.  No acute osseous injury of the lumbar spine.   Assessment & Plan:   Problem List Items Addressed This Visit   None No orders of the defined types were placed in this encounter. 38 minutes spent extra time needed because of language barrier multisystems assessed Follow-up: No follow-ups on file.    Shan Levans, MD

## 2022-10-28 ENCOUNTER — Ambulatory Visit: Payer: Medicaid Other | Admitting: Orthopedic Surgery

## 2022-12-02 ENCOUNTER — Other Ambulatory Visit (HOSPITAL_COMMUNITY): Payer: Self-pay

## 2022-12-06 ENCOUNTER — Other Ambulatory Visit: Payer: Self-pay

## 2022-12-06 ENCOUNTER — Emergency Department (HOSPITAL_COMMUNITY)
Admission: EM | Admit: 2022-12-06 | Discharge: 2022-12-06 | Disposition: A | Discharge status: Home / Self Care | Payer: Medicaid Other | Attending: Emergency Medicine | Admitting: Emergency Medicine

## 2022-12-06 ENCOUNTER — Encounter (HOSPITAL_COMMUNITY): Payer: Self-pay

## 2022-12-06 DIAGNOSIS — M2540 Effusion, unspecified joint: Secondary | ICD-10-CM | POA: Diagnosis present

## 2022-12-06 DIAGNOSIS — M109 Gout, unspecified: Secondary | ICD-10-CM | POA: Diagnosis not present

## 2022-12-06 DIAGNOSIS — M1A9XX1 Chronic gout, unspecified, with tophus (tophi): Secondary | ICD-10-CM

## 2022-12-06 LAB — CBC WITH DIFFERENTIAL/PLATELET
Abs Immature Granulocytes: 0.01 10*3/uL (ref 0.00–0.07)
Basophils Absolute: 0 10*3/uL (ref 0.0–0.1)
Basophils Relative: 1 %
Eosinophils Absolute: 0 10*3/uL (ref 0.0–0.5)
Eosinophils Relative: 1 %
HCT: 24.7 % — ABNORMAL LOW (ref 39.0–52.0)
Hemoglobin: 7.6 g/dL — ABNORMAL LOW (ref 13.0–17.0)
Immature Granulocytes: 0 %
Lymphocytes Relative: 37 %
Lymphs Abs: 1.6 10*3/uL (ref 0.7–4.0)
MCH: 28.4 pg (ref 26.0–34.0)
MCHC: 30.8 g/dL (ref 30.0–36.0)
MCV: 92.2 fL (ref 80.0–100.0)
Monocytes Absolute: 0.7 10*3/uL (ref 0.1–1.0)
Monocytes Relative: 16 %
Neutro Abs: 2 10*3/uL (ref 1.7–7.7)
Neutrophils Relative %: 45 %
Platelets: 100 10*3/uL — ABNORMAL LOW (ref 150–400)
RBC: 2.68 MIL/uL — ABNORMAL LOW (ref 4.22–5.81)
RDW: 22 % — ABNORMAL HIGH (ref 11.5–15.5)
Smear Review: DECREASED
WBC: 4.4 10*3/uL (ref 4.0–10.5)
nRBC: 0 % (ref 0.0–0.2)

## 2022-12-06 LAB — BASIC METABOLIC PANEL
Anion gap: 10 (ref 5–15)
BUN: 9 mg/dL (ref 6–20)
CO2: 21 mmol/L — ABNORMAL LOW (ref 22–32)
Calcium: 8 mg/dL — ABNORMAL LOW (ref 8.9–10.3)
Chloride: 106 mmol/L (ref 98–111)
Creatinine, Ser: 0.6 mg/dL — ABNORMAL LOW (ref 0.61–1.24)
GFR, Estimated: >60 mL/min (ref >60)
Glucose, Bld: 120 mg/dL — ABNORMAL HIGH (ref 70–99)
Potassium: 3.3 mmol/L — ABNORMAL LOW (ref 3.5–5.1)
Sodium: 137 mmol/L (ref 135–145)

## 2022-12-06 MED ORDER — COLCHICINE 0.6 MG PO TABS
0.6000 mg | ORAL_TABLET | Freq: Once | ORAL | Status: AC
  Administered 2022-12-06: 0.6 mg via ORAL
  Filled 2022-12-06: qty 1

## 2022-12-06 MED ORDER — COLCHICINE 0.6 MG PO TABS: ORAL_TABLET | ORAL | 0 refills | Status: DC

## 2022-12-06 NOTE — Discharge Instructions (Addendum)
Vous avez t vu aujourd'hui pour un gonflement des articulations. Pendant que vous tiez ici, nous avons surveill vos signes vitaux, effectu un examen physique et vous avons donn des [employer]	[place] soulager vos symptmes.  Nous vous avons galement fourni des bquilles pour vous Immunologist. Tous ces lments taient rassurants et il n'y a aucune indication pour des tests supplmentaires ou une intervention au service des urgences [employer] moment.   Choses  faire :  - Faites un suivi auprs de votre fournisseur de soins primaires dans les 1  2 prochaines semaines afin qu'il puisse vous aider  remplir vos futures ordonnances et guider votre gestion des mdicaments si ncessaire.  Retournez au service des urgences si vous prsentez des symptmes nouveaux ou qui s'aggravent, notamment de la fivre, des frissons, des difficults d'amplitude de mouvement, une incapacit  marcher ou si vous avez d'autres inquitudes.

## 2022-12-06 NOTE — ED Provider Notes (Signed)
Brandon Arias EMERGENCY DEPARTMENT AT [facility] Provider Note  MDM   HPI/ROS:  Brandon Arias is a 52 y.o. male with past medical history of gout, unspecified anemia and thrombocytopenia presenting with chief complaint of joint swelling.  Patient has been experiencing joint swelling for weeks to months, but it has worsened recently and he is now unable to walk without severe pain.  Patient has been prescribed colchicine and indomethacin in the past, but is out of his medications.  He denies fevers, chills, difficulty with range of motion, joint redness or warmth.  Denies any recent trauma  Per chart review, patient has been anemic with hemoglobin in the sevens for quite some time now.  He has been worked up with iron studies in the past which have revealed results consistent with iron deficiency anemia.  Physical exam is notable for: - Overall well-appearing, no acute distress - Diffuse joint swelling involving bilateral elbows, left knee, right foot - No difficulty or pain with passive or active range of motion - No erythema or warmth of affected joints  On my initial evaluation, patient is:  -Vital signs stable. Patient afebrile, hemodynamically stable, and non-toxic appearing. -Additional history obtained from chart review  Given the patient's history and physical exam, differential diagnosis includes but is not limited to gout, septic arthritis, unspecified crystalloid arthropathy, transient synovitis, infection, etc.  Interpretations, interventions, and the patient's course of care are documented below.    Clinical Course as of 12/06/22 1950  Sun Dec 06, 2022  1920 Stable 3 days of foot and leg swelling  HX of gout and worsening. [CC]    Clinical Course User Index [CC] Glyn Ade, MD    Initial workup obtained in triage includes CBC, BMP.  Remarkable for hemoglobin of 7.6, relatively baseline per chart review.  Platelet count of 100, also baseline per chart  review.  Normal renal function, potassium slightly low at 3.3.  Exceedingly low concern for infectious process/septic arthritis given that joint involvement is diffuse, there are no signs of joint infection on physical exam, patient is able to range joints without significant pain, and he has been afebrile and without infectious symptoms as of recently.  Most likely represents acute gout flare especially in the context of patient having run out of his medications.  Patient was given a single dose of colchicine here in the emergency department and an outpatient prescription for additional colchicine at the dose prescribed previously.  He was also provided with some crutches for ambulation.  At this time, patient has been deemed safe and stable for discharge.  Strict return precautions discussed.  Instructed to take medication as prescribed.  Instructed follow-up with primary care provider.  No additional concerns at this time.  Disposition:  I discussed the plan for discharge with the patient and/or their surrogate at bedside prior to discharge and they were in agreement with the plan and verbalized understanding of the return precautions provided. All questions answered to the best of my ability. Ultimately, the patient was discharged in stable condition with stable vital signs. I am reassured that they are capable of close follow up and good social support at home.   Clinical Impression:  1. Gout, unspecified cause, unspecified chronicity, unspecified site   2. Gout, tophaceous     Rx / DC Orders ED Discharge Orders          Ordered    colchicine 0.6 MG tablet        12/06/22 1941  The plan for this patient was discussed with Dr. Doran Durand, who voiced agreement and who oversaw evaluation and treatment of this patient.   Clinical Complexity A medically appropriate history, review of systems, and physical exam was performed.  My independent interpretations of EKG, labs, and  radiology are documented in the ED course above.   Click here for ABCD2, HEART and other calculatorsREFRESH Note before signing   Patient's presentation is most consistent with acute complicated illness / injury requiring diagnostic workup.  Medical Decision Making Amount and/or Complexity of Data Reviewed Labs: ordered.  Risk Prescription drug management.    HPI/ROS      See MDM section for pertinent HPI and ROS. A complete ROS was performed with pertinent positives/negatives noted above.   Past Medical History:  Diagnosis Date   Gout 12/30/2017    Past Surgical History:  Procedure Laterality Date   no known surgeries        Physical Exam   Vitals:   12/06/22 1639 12/06/22 1643  BP: (!) 134/96   Pulse: 83   Resp: 16   Temp: 98.6 F (37 C)   TempSrc: Oral   SpO2: 99%   Weight:  72 kg  Height:  5\' 8"  (1.727 m)    Physical Exam Vitals and nursing note reviewed.  Constitutional:      General: He is not in acute distress.    Appearance: He is well-developed.  HENT:     Head: Normocephalic and atraumatic.  Eyes:     Conjunctiva/sclera: Conjunctivae normal.  Cardiovascular:     Rate and Rhythm: Normal rate and regular rhythm.     Heart sounds: No murmur heard. Pulmonary:     Effort: Pulmonary effort is normal. No respiratory distress.     Breath sounds: Normal breath sounds.  Abdominal:     Palpations: Abdomen is soft.     Tenderness: There is no abdominal tenderness.  Musculoskeletal:        General: Swelling and tenderness present. Normal range of motion.     Cervical back: Neck supple.  Skin:    General: Skin is warm and dry.     Capillary Refill: Capillary refill takes less than 2 seconds.  Neurological:     Mental Status: He is alert.  Psychiatric:        Mood and Affect: Mood normal.    Starleen Arms, MD Department of Emergency Medicine   Please note that this documentation was produced with the assistance of voice-to-text technology and may  contain errors.    Dyanne Iha, MD 12/06/22 1950    Glyn Ade, MD 12/11/22 1453

## 2022-12-06 NOTE — ED Triage Notes (Signed)
Pt c/o swelling in left knee. Left elbow and anterior of right footx3d. PT has 2+ swelling of left knee. 2+ swelling of left elbow and 2+ swelling of right anterior of foot.

## 2023-01-06 ENCOUNTER — Telehealth: Payer: Self-pay | Admitting: *Deleted

## 2023-01-06 NOTE — Telephone Encounter (Signed)
Called patient to schedule for OV to return for BP check. Please schedule if he calls back for HTN f/u As a walkin or Saturday 01/16/2023 if possible.  If not, first available will good as well.   Attempted to call on 3 numbers.  [phone] (Mobile) - not  Left message on voicemail to return call. [phone] (Home Phone) [phone] [facility] Phone)    Due to language barrier, an interpreter was used.  Interpreter with [employer] ID [phone], Reason for encounter- HTN appt.

## 2023-03-16 ENCOUNTER — Telehealth: Payer: Self-pay | Admitting: Critical Care Medicine

## 2023-03-16 NOTE — Telephone Encounter (Signed)
Mrs Brandon Arias from [place] calling for updated appt notes for the ot,. Pt only seen 1 time since the appt Dr Delford Field wrote him out of work in [employer] 2024.  He came to her offfice today w/ paperwork asking to apply for disability.  However, since pt has not been seen since Jan, she cannot help the pt.

## 2023-03-17 NOTE — Telephone Encounter (Signed)
Interpreter # [phone] .  Call placed to patient unable to reach unable to leave a message  on VM.

## 2023-03-19 NOTE — Telephone Encounter (Signed)
Call placed to patient unable to reach message left on VM.  Letter mailed in french  March 19, 2023   Brandon Arias [address] [place] [address]   Dear [name],  The [facility] and Wellness Department is trying to contact you about the call we received on 19/02/2023. We have an additional question regarding your issue. We would be able to help you. Please contact us at [phone] and ask to speak to the nurse.  If you have any questions or concerns, please feel free to call.  Sincerely

## 2023-06-10 NOTE — Congregational Nurse Program (Signed)
  Dept: [phone]   Congregational Nurse Program Note  Date of Encounter: 06/10/2023  Past Medical History: Past Medical History:  Diagnosis Date   Gout 12/30/2017    Encounter Details:  Community Questionnaire - 06/10/23 1608       Questionnaire   Ask client: Do you give verbal consent for me to treat you today? Yes    Student Assistance [employer]    Location Patient Served  [facility]    Encounter Setting CN site    Population Status Unhoused    Insurance Medicaid    Insurance/Financial Assistance Referral N/A    Medication N/A    Medical Provider Yes    Screening Referrals Made N/A    Medical Referrals Made N/A    Medical Appointment Completed N/A    CNP Interventions Advocate/Support;Navigate Healthcare System;Case Management;Educate;Counsel    Screenings CN Performed N/A    ED Visit Averted N/A    Life-Saving Intervention Made N/A             Client referred to RN by [employer], he need help filling out the paperwork for disability program with [employer]. RN able to assist and listen to client concerns. [place] interpreter used to make sure client and I could communicate effectively. He was also given resources for [facility] but he states he went to them already and they said that place is only for those newly here in [place] and they dismissed him. He was also given [employer] as a resource to use.

## 2023-06-15 ENCOUNTER — Emergency Department (HOSPITAL_COMMUNITY): Payer: Medicaid Other

## 2023-06-15 ENCOUNTER — Other Ambulatory Visit: Payer: Self-pay

## 2023-06-15 ENCOUNTER — Encounter (HOSPITAL_COMMUNITY): Payer: Self-pay

## 2023-06-15 ENCOUNTER — Emergency Department (HOSPITAL_COMMUNITY)
Admission: EM | Admit: 2023-06-15 | Discharge: 2023-06-15 | Disposition: A | Discharge status: Home / Self Care | Payer: Medicaid Other | Attending: Emergency Medicine | Admitting: Emergency Medicine

## 2023-06-15 DIAGNOSIS — R1084 Generalized abdominal pain: Secondary | ICD-10-CM | POA: Insufficient documentation

## 2023-06-15 DIAGNOSIS — M2550 Pain in unspecified joint: Secondary | ICD-10-CM | POA: Insufficient documentation

## 2023-06-15 DIAGNOSIS — Z79899 Other long term (current) drug therapy: Secondary | ICD-10-CM | POA: Diagnosis not present

## 2023-06-15 DIAGNOSIS — I10 Essential (primary) hypertension: Secondary | ICD-10-CM | POA: Diagnosis not present

## 2023-06-15 DIAGNOSIS — R030 Elevated blood-pressure reading, without diagnosis of hypertension: Secondary | ICD-10-CM

## 2023-06-15 DIAGNOSIS — R109 Unspecified abdominal pain: Secondary | ICD-10-CM | POA: Diagnosis present

## 2023-06-15 DIAGNOSIS — M1A9XX1 Chronic gout, unspecified, with tophus (tophi): Secondary | ICD-10-CM

## 2023-06-15 HISTORY — DX: Essential (primary) hypertension: I10

## 2023-06-15 HISTORY — DX: Unspecified osteoarthritis, unspecified site: M19.90

## 2023-06-15 LAB — CBC
HCT: 26.1 % — ABNORMAL LOW (ref 39.0–52.0)
Hemoglobin: 8.5 g/dL — ABNORMAL LOW (ref 13.0–17.0)
MCH: 30.1 pg (ref 26.0–34.0)
MCHC: 32.6 g/dL (ref 30.0–36.0)
MCV: 92.6 fL (ref 80.0–100.0)
Platelets: 125 10*3/uL — ABNORMAL LOW (ref 150–400)
RBC: 2.82 MIL/uL — ABNORMAL LOW (ref 4.22–5.81)
RDW: 18.6 % — ABNORMAL HIGH (ref 11.5–15.5)
WBC: 4.9 10*3/uL (ref 4.0–10.5)
nRBC: 0 % (ref 0.0–0.2)

## 2023-06-15 LAB — URINALYSIS, ROUTINE W REFLEX MICROSCOPIC
Bacteria, UA: NONE SEEN
Bilirubin Urine: NEGATIVE
Glucose, UA: NEGATIVE mg/dL
Ketones, ur: NEGATIVE mg/dL
Leukocytes,Ua: NEGATIVE
Nitrite: NEGATIVE
Protein, ur: 30 mg/dL — AB
Specific Gravity, Urine: 1.012 (ref 1.005–1.030)
pH: 7 (ref 5.0–8.0)

## 2023-06-15 LAB — COMPREHENSIVE METABOLIC PANEL
ALT: 36 U/L (ref 0–44)
AST: 79 U/L — ABNORMAL HIGH (ref 15–41)
Albumin: 2.1 g/dL — ABNORMAL LOW (ref 3.5–5.0)
Alkaline Phosphatase: 148 U/L — ABNORMAL HIGH (ref 38–126)
Anion gap: 9 (ref 5–15)
BUN: 11 mg/dL (ref 6–20)
CO2: 22 mmol/L (ref 22–32)
Calcium: 8.3 mg/dL — ABNORMAL LOW (ref 8.9–10.3)
Chloride: 102 mmol/L (ref 98–111)
Creatinine, Ser: 0.67 mg/dL (ref 0.61–1.24)
GFR, Estimated: >60 mL/min (ref >60)
Glucose, Bld: 99 mg/dL (ref 70–99)
Potassium: 3.7 mmol/L (ref 3.5–5.1)
Sodium: 133 mmol/L — ABNORMAL LOW (ref 135–145)
Total Bilirubin: 1.1 mg/dL (ref 0.0–1.2)
Total Protein: 8 g/dL (ref 6.5–8.1)

## 2023-06-15 LAB — RESP PANEL BY RT-PCR (RSV, FLU A&B, COVID)  RVPGX2
Influenza A by PCR: NEGATIVE
Influenza B by PCR: NEGATIVE
Resp Syncytial Virus by PCR: NEGATIVE
SARS Coronavirus 2 by RT PCR: NEGATIVE

## 2023-06-15 LAB — LIPASE, BLOOD: Lipase: 51 U/L (ref 11–51)

## 2023-06-15 LAB — URIC ACID: Uric Acid, Serum: 9.2 mg/dL — ABNORMAL HIGH (ref 3.7–8.6)

## 2023-06-15 MED ORDER — FENTANYL CITRATE PF 50 MCG/ML IJ SOSY
50.0000 ug | PREFILLED_SYRINGE | Freq: Once | INTRAMUSCULAR | Status: AC
  Administered 2023-06-15: 50 ug via INTRAVENOUS
  Filled 2023-06-15: qty 1

## 2023-06-15 MED ORDER — COLCHICINE 0.6 MG PO TABS: ORAL_TABLET | ORAL | 0 refills | Status: DC

## 2023-06-15 MED ORDER — ACETAMINOPHEN 500 MG PO TABS: 1000.0000 mg | ORAL_TABLET | Freq: Once | ORAL | Status: DC

## 2023-06-15 MED ORDER — IRBESARTAN 300 MG PO TABS
150.0000 mg | ORAL_TABLET | Freq: Once | ORAL | Status: AC
  Administered 2023-06-15: 150 mg via ORAL
  Filled 2023-06-15: qty 1

## 2023-06-15 MED ORDER — AMLODIPINE BESYLATE 5 MG PO TABS
10.0000 mg | ORAL_TABLET | Freq: Once | ORAL | Status: AC
  Administered 2023-06-15: 10 mg via ORAL
  Filled 2023-06-15: qty 2

## 2023-06-15 MED ORDER — METHYLPREDNISOLONE 4 MG PO TBPK: ORAL_TABLET | ORAL | 0 refills | Status: DC

## 2023-06-15 MED ORDER — COLCHICINE 0.6 MG PO TABS
0.6000 mg | ORAL_TABLET | Freq: Once | ORAL | Status: AC
  Administered 2023-06-15: 0.6 mg via ORAL
  Filled 2023-06-15: qty 1

## 2023-06-15 MED ORDER — IOHEXOL 350 MG/ML SOLN
80.0000 mL | Freq: Once | INTRAVENOUS | Status: AC | PRN
  Administered 2023-06-15: 80 mL via INTRAVENOUS

## 2023-06-15 NOTE — ED Notes (Signed)
 Patient transported to CT

## 2023-06-15 NOTE — Discharge Instructions (Signed)
 You were seen in the ER for abdominal and back pain today.  The scan of your chest and abdomen did not show any concerning findings today. Your blood work showed some elevated liver tests, which you have a history of from before.  We gave you pain medication, your home blood pressure medicine, and medicine for gout. I will send medicine for gout to your pharmacy.   Continue to monitor how you're doing and return to the ER for new or worsening symptoms. ____________________________________________________________  Rulon Eisenmenger t vu aux urgences aujourd'hui pour des douleurs abdominales et dorsales.  L'analyse de votre poitrine et de votre abdomen n'a montr aucun rsultat proccupant aujourd'hui. Vos analyses de sang [employer] des tests hpatiques levs, dont vous avez dj eu des antcdents.  Nous vous avons donn des [place], des [employer] la tension artrielle  domicile et des mdicaments contre la goutte. J'enverrai des [employer] la goutte  votre [place].   Continuez  surveiller votre tat et retournez aux urgences en cas de symptmes nouveaux ou aggravs.

## 2023-06-15 NOTE — ED Triage Notes (Signed)
 Pt arrived from home via [facility] c/o left sided flank pain radiates from back to left side.

## 2023-06-15 NOTE — ED Provider Notes (Signed)
 [place] EMERGENCY DEPARTMENT AT [facility] Provider Note   CSN: [id number] Arrival date & time: 06/15/23  4098     History  Chief Complaint  Patient presents with   Flank Pain    Brandon Arias is a 53 y.o. male with history of gout, hypertension, who presents to the emergency department with several complaints.  He is complaining of abdominal pain that radiates to his back, joint pain.  He describes his abdominal pain as "around his colon".  Initially he had mentioned to triage that it was in his left flank, but he specifies that it is across his whole back as well, in the lower region.  He states pain started earlier yesterday.  He denies any nausea or vomiting.  He feels his joints are swollen, specifically some of his knuckles on the bilateral hands.  Neck pain.  States that he cannot move his legs, but does ambulate to the bathroom unassisted.  States he used to drink alcohol but does not anymore as it makes his gout worse.     The history is provided by the patient. The history is limited by a language barrier. A language interpreter was used.  Flank Pain Associated symptoms include abdominal pain.       Home Medications Prior to Admission medications   Medication Sig Start Date End Date Taking? Authorizing Provider  methylPREDNISolone (MEDROL DOSEPAK) 4 MG TBPK tablet Take per package instructions. 06/15/23  Yes Anaia Frith T, PA-C  amLODipine (NORVASC) 10 MG tablet Take 1 tablet (10 mg total) by mouth daily. 05/06/22   Storm Frisk, MD  colchicine 0.6 MG tablet Take 1 tablet followed by another tablet 2 hours later. Repeat again 3 days later if still having pain. 06/15/23   Jackob Crookston T, PA-C  cyclobenzaprine (FLEXERIL) 10 MG tablet Take 1 tablet (10 mg total) by mouth 3 (three) times daily as needed for muscle spasms. Patient not taking: Reported on 03/06/2022 11/10/21   Storm Frisk, MD  ferrous sulfate 325 (65 FE) MG tablet Take 1 tablet  (325 mg total) by mouth daily. 07/10/22   Pricilla Loveless, MD  indomethacin (INDOCIN) 25 MG capsule Take 1 capsule (25 mg total) by mouth 3 (three) times daily with meals. Until bottle empty and STOP 05/06/22   Storm Frisk, MD  polyethylene glycol powder (GLYCOLAX/MIRALAX) 17 GM/SCOOP powder Take 17 g by mouth daily. Patient not taking: Reported on 05/06/2022 03/17/22   Hoy Register, MD  valsartan (DIOVAN) 160 MG tablet Take 1 tablet (160 mg total) by mouth daily. 05/06/22   Storm Frisk, MD      Allergies    Patient has no known allergies.    Review of Systems   Review of Systems  Gastrointestinal:  Positive for abdominal pain.  Musculoskeletal:  Positive for arthralgias, back pain and joint swelling.  All other systems reviewed and are negative.   Physical Exam Updated Vital Signs BP (!) 199/101   Pulse 65   Temp 98.6 F (37 C) (Oral)   Resp 16   Ht 5\' 8"  (1.727 m)   SpO2 99%   BMI 24.14 kg/m  Physical Exam Vitals and nursing note reviewed.  Constitutional:      Appearance: Normal appearance.  HENT:     Head: Normocephalic and atraumatic.  Eyes:     Conjunctiva/sclera: Conjunctivae normal.  Cardiovascular:     Rate and Rhythm: Normal rate and regular rhythm.  Pulmonary:     Effort: Pulmonary effort  is normal. No respiratory distress.     Breath sounds: Normal breath sounds.  Abdominal:     General: There is no distension.     Palpations: Abdomen is soft.     Tenderness: There is generalized abdominal tenderness and tenderness in the epigastric area.  Musculoskeletal:     Comments: Swollen left 5th PIP, swollen right 3rd and 5th PIP, no overlying erythema, tenderness, normal ROM but with some pain  Skin:    General: Skin is warm and dry.  Neurological:     General: No focal deficit present.     Mental Status: He is alert.     ED Results / Procedures / Treatments   Labs (all labs ordered are listed, but only abnormal results are displayed) Labs  Reviewed  COMPREHENSIVE METABOLIC PANEL - Abnormal; Notable for the following components:      Result Value   Sodium 133 (*)    Calcium 8.3 (*)    Albumin 2.1 (*)    AST 79 (*)    Alkaline Phosphatase 148 (*)    All other components within normal limits  CBC - Abnormal; Notable for the following components:   RBC 2.82 (*)    Hemoglobin 8.5 (*)    HCT 26.1 (*)    RDW 18.6 (*)    Platelets 125 (*)    All other components within normal limits  URINALYSIS, ROUTINE W REFLEX MICROSCOPIC - Abnormal; Notable for the following components:   Hgb urine dipstick SMALL (*)    Protein, ur 30 (*)    All other components within normal limits  URIC ACID - Abnormal; Notable for the following components:   Uric Acid, Serum 9.2 (*)    All other components within normal limits  RESP PANEL BY RT-PCR (RSV, FLU A&B, COVID)  RVPGX2  LIPASE, BLOOD    EKG None  Radiology CT Angio Chest/Abd/Pel for Dissection W and/or Wo Contrast Result Date: 06/15/2023 CLINICAL DATA:  Radiating left-sided flank pain, acute aortic syndrome suspected EXAM: CT ANGIOGRAPHY CHEST, ABDOMEN AND PELVIS TECHNIQUE: Non-contrast CT of the chest was initially obtained. Multidetector CT imaging through the chest, abdomen and pelvis was performed using the standard protocol during bolus administration of intravenous contrast. Multiplanar reconstructed images and MIPs were obtained and reviewed to evaluate the vascular anatomy. RADIATION DOSE REDUCTION: This exam was performed according to the departmental dose-optimization program which includes automated exposure control, adjustment of the mA and/or kV according to patient size and/or use of iterative reconstruction technique. CONTRAST:  80mL OMNIPAQUE IOHEXOL 350 MG/ML SOLN COMPARISON:  CT abdomen pelvis, 01/31/2021 FINDINGS: CTA CHEST FINDINGS VASCULAR Aorta: Satisfactory opacification of the aorta. Normal contour and caliber of the thoracic aorta. No evidence of aneurysm, dissection, or  other acute aortic pathology. Scattered aortic atherosclerosis Cardiovascular: No evidence of pulmonary embolism on limited non-tailored examination. Cardiomegaly. No pericardial effusion. Review of the MIP images confirms the above findings. NON VASCULAR Mediastinum/Nodes: No enlarged mediastinal, hilar, or axillary lymph nodes. Thyroid gland, trachea, and esophagus demonstrate no significant findings. Lungs/Pleura: Mild bibasilar scarring or atelectasis. No pleural effusion or pneumothorax. Musculoskeletal: No chest wall abnormality. No acute osseous findings. Review of the MIP images confirms the above findings. CTA ABDOMEN AND PELVIS FINDINGS VASCULAR Normal contour and caliber of the abdominal aorta. No evidence of aneurysm, dissection, or other acute aortic pathology. Standard branching pattern of the abdominal aorta with solitary bilateral renal arteries. No significant abdominal aortic atherosclerosis. Review of the MIP images confirms the above findings. NON-VASCULAR Hepatobiliary:  Coarse, nodular, cirrhotic morphology of the liver. No gallstones, gallbladder wall thickening, or biliary dilatation. Pancreas: Unremarkable. No pancreatic ductal dilatation or surrounding inflammatory changes. Spleen: Normal in size without significant abnormality. Adrenals/Urinary Tract: Adrenal glands are unremarkable. Kidneys are normal, without renal calculi, solid lesion, or hydronephrosis. Bladder is unremarkable. Stomach/Bowel: Stomach is within normal limits. Appendix appears normal. No evidence of bowel wall thickening, distention, or inflammatory changes. Lymphatic: No enlarged abdominal or pelvic lymph nodes. Reproductive: No mass or other significant abnormality. Other: No abdominal wall hernia or abnormality. Small volume ascites throughout the abdomen and pelvis. Musculoskeletal: No acute osseous findings. IMPRESSION: 1. Normal contour and caliber of the thoracic and abdominal aorta. No evidence of aneurysm,  dissection, or other acute aortic pathology. Scattered thoracic aortic atherosclerosis. 2. Cardiomegaly. 3. Cirrhosis. 4. Small volume ascites. Electronically Signed   By: Jearld Lesch M.D.   On: 06/15/2023 11:40    Procedures Procedures    Medications Ordered in ED Medications  fentaNYL (SUBLIMAZE) injection 50 mcg (50 mcg Intravenous Given 06/15/23 0929)  iohexol (OMNIPAQUE) 350 MG/ML injection 80 mL (80 mLs Intravenous Contrast Given 06/15/23 1124)  irbesartan (AVAPRO) tablet 150 mg (150 mg Oral Given 06/15/23 1242)  amLODipine (NORVASC) tablet 10 mg (10 mg Oral Given 06/15/23 1242)  colchicine tablet 0.6 mg (0.6 mg Oral Given 06/15/23 1242)    ED Course/ Medical Decision Making/ A&P                                 Medical Decision Making Amount and/or Complexity of Data Reviewed Labs: ordered. Radiology: ordered.  Risk Prescription drug management.  This patient is a 53 y.o. male  who presents to the ED for concern of abdominal pain, back pain, neck pain, joint swelling.   Differential diagnoses prior to evaluation: The emergent differential diagnosis includes, but is not limited to,  AAA, mesenteric ischemia, appendicitis, diverticulitis, DKA, gastroenteritis, nephrolithiasis, pancreatitis, constipation, UTI, bowel obstruction, biliary disease, IBD, PUD, hepatitis, gout, septic arthritis. This is not an exhaustive differential.   Past Medical History / Co-morbidities / Social History: gout, hypertension, iron deficiency anemia  Additional history: Chart reviewed. Pertinent results include: Reviewed ER visit note from August 2020: Patient presented with polyarthritic disease with diffuse myalgias, he was given colchicine and discharged.  Physical Exam: Physical exam performed. The pertinent findings include: Hypertensive to 192/109, otherwise normal vital signs.  Appears uncomfortable in exam bed.  Abdomen soft with generalized tenderness, slightly worse in the epigastrium.   Pain radiates to the bilateral flanks.  Normal sensation and movement of all extremities, ambulates without assistance.  Lab Tests/Imaging studies: I personally interpreted labs/imaging and the pertinent results include: CBC with low hemoglobin, stable compared to prior.  CMP with AST 79, alkaline phosphatase 148.  UA with small hemoglobin, normal lipase. Respiratory panel negative.  Uric acid 9.2.  CT dissection study without dissection or other acute cardiopulmonary abnormalities. I agree with the radiologist interpretation.  Cardiac monitoring: EKG obtained and interpreted by myself and attending physician which shows: NSR   Medications: I ordered medication including fentanyl, colchicine, home BP meds.  I have reviewed the patients home medicines and have made adjustments as needed.   Disposition: After consideration of the diagnostic results and the patients response to treatment, I feel that emergency department workup does not suggest an emergent condition requiring admission or immediate intervention beyond what has been performed at this time. The plan is: discharge  to home. No emergent etiology found for abdominal pain. Pt with stable blood work, reassuring imaging. Given colchicine and prednisone for gout flare. No evidence of end organ dysfunction to raise suspicion for hypertensive emergency. The patient is safe for discharge and has been instructed to return immediately for worsening symptoms, change in symptoms or any other concerns.  Final Clinical Impression(s) / ED Diagnoses Final diagnoses:  Elevated blood pressure reading  Polyarthralgia  Generalized abdominal pain    Rx / DC Orders ED Discharge Orders          Ordered    methylPREDNISolone (MEDROL DOSEPAK) 4 MG TBPK tablet        06/15/23 1307    colchicine 0.6 MG tablet        06/15/23 1307           Portions of this report may have been transcribed using voice recognition software. Every effort was made to  ensure accuracy; however, inadvertent computerized transcription errors may be present.    Su Monks, PA-C 06/15/23 1318    Sloan Leiter, DO 06/16/23 2053

## 2023-08-17 NOTE — Congregational Nurse Program (Signed)
 Client to RN office for scheduled appointment.  [place] interpreter used to assist with call.  He recently went to [facility] [employer] and client states they could not help him. He is asking for help with Job, also states FIA did not help. Submitted an application with [employer] but has not heard back. 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 Has been difficult finding a job due to language barrier.  Requesting help with Housing for self and kids.  Family is here. Two children and wife.  He staying at the [facility] ends May 19th 2025.  His family isn't stay with him there and he is having a difficult time answering to RN where they are.   Letter for MD about benefits for housing, directed through application, job, has medicaid, without work no income.   RN will refer client to [facility] and they will assist with housing needs. Client also in need of job and RN will have to look up resources for him to get a job that help with french speakers. RN requesting to speak to [employer] on clients behalf to understand what they assisted him with and client in agreement.  Client has no further questions at this time. RN will follow up with client.

## 2023-09-06 NOTE — Congregational Nurse Program (Signed)
 Client to RN office for help understanding documents. He has been denied social security disability and RN helping him understand the process for an appeal and that he needs to reschedule his mental health consultation, this may be a reason for his denial. RN requesting he work with social workers on this process on Monday May 19th. He needs a representative due to the language barrier. RN will reach out to CSW when they are available. No further concerns at this time.

## 2023-09-11 ENCOUNTER — Ambulatory Visit: Admitting: Family Medicine

## 2023-09-11 NOTE — Progress Notes (Signed)
 [place] Pt. Privacy practices reviewed. HIPAA form signed. Pt currently staying at [facility] but pass expires in 2 days. Case worker called on behalf of client r/t language barrier to inquire about bed status expiration. [employer] services used with french interpreter [phone]. Pt plans to call primary MD about acute gout flare up. Reviewed diet and homeopathic suggestions. Pt has experienced great loss with son and daughter in hospital. Pt is not homeless, he just cannot stay in the home in which the death of his son [facility]

## 2023-09-21 ENCOUNTER — Other Ambulatory Visit: Payer: Self-pay

## 2023-09-21 MED ORDER — VALSARTAN 160 MG PO TABS
160.0000 mg | ORAL_TABLET | Freq: Every day | ORAL | 1 refills | Status: DC
  Filled 2023-09-22: qty 90, 90d supply, fill #0

## 2023-09-21 MED ORDER — ALLOPURINOL 100 MG PO TABS
100.0000 mg | ORAL_TABLET | ORAL | 0 refills | Status: DC
  Filled 2023-09-22: qty 90, 90d supply, fill #0

## 2023-09-21 MED ORDER — AMLODIPINE BESYLATE 10 MG PO TABS
10.0000 mg | ORAL_TABLET | Freq: Every day | ORAL | 1 refills | Status: DC
  Filled 2023-09-22: qty 90, 90d supply, fill #0

## 2023-09-21 NOTE — Congregational Nurse Program (Signed)
 Client to RN office. He is need of help with disability paperwork and making appointment with [facility]. RN is unable to assist with request. He doesn't have correct paperwork and needs more assistance than RN can provide. RN suggested going to [facility] case Production designer, theatre/television/film. He did disclose that he is out medicine and needs help with assistance. RN was able to transfer medications to [facility] on [address] to help client with accessibility. Lastly, RN provided with information again on vision care at [facility] on [address] and three bus passes to get to vision care and [facility]. Additionally RN will pick up medication from client and deliver tomorrow. RN spoke to client about PCP care specifically virtual PCP so RN is able to advocate for clients needs and get a referral to [facility] or [facility]. No further needs at this time.

## 2023-09-22 ENCOUNTER — Other Ambulatory Visit: Payer: Self-pay

## 2023-10-24 ENCOUNTER — Encounter (HOSPITAL_COMMUNITY): Payer: Self-pay | Admitting: Emergency Medicine

## 2023-10-24 ENCOUNTER — Emergency Department (HOSPITAL_COMMUNITY)
Admission: EM | Admit: 2023-10-24 | Discharge: 2023-10-25 | Disposition: A | Discharge status: Home / Self Care | Attending: Emergency Medicine | Admitting: Emergency Medicine

## 2023-10-24 DIAGNOSIS — I10 Essential (primary) hypertension: Secondary | ICD-10-CM | POA: Diagnosis not present

## 2023-10-24 DIAGNOSIS — Z79899 Other long term (current) drug therapy: Secondary | ICD-10-CM | POA: Diagnosis not present

## 2023-10-24 DIAGNOSIS — M109 Gout, unspecified: Secondary | ICD-10-CM

## 2023-10-24 DIAGNOSIS — M1A9XX1 Chronic gout, unspecified, with tophus (tophi): Secondary | ICD-10-CM | POA: Insufficient documentation

## 2023-10-24 DIAGNOSIS — M25522 Pain in left elbow: Secondary | ICD-10-CM | POA: Diagnosis present

## 2023-10-24 NOTE — ED Triage Notes (Signed)
 Pt  [facility] with c/o gout to the left knee and elbow history of same

## 2023-10-25 ENCOUNTER — Other Ambulatory Visit: Payer: Self-pay

## 2023-10-25 MED ORDER — COLCHICINE 0.6 MG PO TABS: ORAL_TABLET | ORAL | 0 refills | Status: DC

## 2023-10-25 MED ORDER — OXYCODONE-ACETAMINOPHEN 5-325 MG PO TABS
1.0000 | ORAL_TABLET | Freq: Once | ORAL | Status: AC
  Administered 2023-10-25: 1 via ORAL
  Filled 2023-10-25: qty 1

## 2023-10-25 MED ORDER — COLCHICINE 0.6 MG PO TABS
ORAL_TABLET | ORAL | 0 refills | Status: DC
  Filled 2023-10-25: qty 10, 5d supply, fill #0

## 2023-10-25 MED ORDER — METHYLPREDNISOLONE 4 MG PO TBPK
ORAL_TABLET | ORAL | 0 refills | Status: DC
  Filled 2023-10-25 – 2023-10-26 (×2): qty 21, 6d supply, fill #0

## 2023-10-25 MED ORDER — METHYLPREDNISOLONE 4 MG PO TBPK
ORAL_TABLET | ORAL | 0 refills | Status: DC
Start: 2023-10-25 — End: 2023-10-25

## 2023-10-25 NOTE — Discharge Instructions (Addendum)
  Prenez votre prednisone  comme prescrit. Prenez votre colchicine  comme prescrit. [facility] consulter [employer].  Take prednisone  as prescribed.  Take colchicine  as prescribed.  Please follow-up with  and wellness.

## 2023-10-25 NOTE — ED Provider Notes (Signed)
 [place] EMERGENCY DEPARTMENT AT [facility] Provider Note   CSN: [id number] Arrival date & time: 10/24/23  2122     Patient presents with: No chief complaint on file.   Brandon Arias is a 53 y.o. male.   53 year old male with past medical history of gout, hypertension presents with complaint of pain in his left elbow, left hand, left knee, bilateral feet.  Reports similar to prior gout flares.  Denies trauma to the area or fever.       Prior to Admission medications   Medication Sig Start Date End Date Taking? Authorizing Provider  allopurinol  (ZYLOPRIM ) 100 MG tablet Take 1 tablet (100 mg total) by mouth every morning. 03/16/23     amLODipine  (NORVASC ) 10 MG tablet Take 1 tablet (10 mg total) by mouth daily. 05/06/22   Brien Belvie BRAVO, MD  amLODipine  (NORVASC ) 10 MG tablet Take 1 tablet (10 mg total) by mouth daily. 03/16/23     colchicine  0.6 MG tablet Take 1 tablet followed by another tablet 2 hours later. Repeat again 3 days later if still having pain. 10/25/23   Beverley Leita LABOR, PA-C  cyclobenzaprine  (FLEXERIL ) 10 MG tablet Take 1 tablet (10 mg total) by mouth 3 (three) times daily as needed for muscle spasms. Patient not taking: Reported on 03/06/2022 11/10/21   Brien Belvie BRAVO, MD  ferrous sulfate  325 (65 FE) MG tablet Take 1 tablet (325 mg total) by mouth daily. 07/10/22   Freddi Hamilton, MD  indomethacin  (INDOCIN ) 25 MG capsule Take 1 capsule (25 mg total) by mouth 3 (three) times daily with meals. Until bottle empty and STOP 05/06/22   Brien Belvie BRAVO, MD  methylPREDNISolone  (MEDROL  DOSEPAK) 4 MG TBPK tablet Take per package instructions. 10/25/23   Beverley Leita LABOR, PA-C  polyethylene glycol powder (GLYCOLAX /MIRALAX ) 17 GM/SCOOP powder Take 17 g by mouth daily. Patient not taking: Reported on 05/06/2022 03/17/22   Newlin, Enobong, MD  valsartan  (DIOVAN ) 160 MG tablet Take 1 tablet (160 mg total) by mouth daily. 05/06/22   Brien Belvie BRAVO, MD  valsartan   (DIOVAN ) 160 MG tablet Take 1 tablet (160 mg total) by mouth daily. 03/17/23       Allergies: Patient has no known allergies.    Review of Systems Negative except as per HPI Updated Vital Signs BP 134/82   Pulse 91   Temp 97.9 F (36.6 C) (Oral)   SpO2 100%   Physical Exam Vitals and nursing note reviewed.  Constitutional:      General: He is not in acute distress.    Appearance: He is well-developed. He is not diaphoretic.  HENT:     Head: Normocephalic and atraumatic.   Cardiovascular:     Pulses: Normal pulses.  Pulmonary:     Effort: Pulmonary effort is normal.   Musculoskeletal:        General: Swelling, tenderness and deformity present.     Comments: Chronic deformity to hands, elbow   Skin:    General: Skin is warm and dry.     Findings: No erythema.   Neurological:     Mental Status: He is alert and oriented to person, place, and time.     Sensory: No sensory deficit.   Psychiatric:        Behavior: Behavior normal.     (all labs ordered are listed, but only abnormal results are displayed) Labs Reviewed - No data to display  EKG: None  Radiology: No results found.   Procedures  Medications Ordered in the ED  oxyCODONE -acetaminophen  (PERCOCET/ROXICET) 5-325 MG per tablet 1 tablet (has no administration in time range)                                    Medical Decision Making  53 year old male with past medical history of gout presents with acute flare with pain in his left hand, left elbow, left knee and bilateral feet.  Tender in these areas, no reports of trauma.  Prednisone  and colchicine  refilled.  Provided with Percocet for his pain currently.  Request follow-up with [facility] and wellness.     Final diagnoses:  Gout, tophaceous    ED Discharge Orders          Ordered    colchicine  0.6 MG tablet        10/25/23 0422    methylPREDNISolone  (MEDROL  DOSEPAK) 4 MG TBPK tablet        10/25/23 0422               Beverley Leita LABOR, PA-C 10/25/23 0424    Haze Lonni PARAS, MD 10/26/23 [phone]

## 2023-10-26 ENCOUNTER — Other Ambulatory Visit: Payer: Self-pay

## 2023-10-26 NOTE — Congregational Nurse Program (Signed)
  Dept: [phone]   Congregational Nurse Program Note  Date of Encounter: 10/26/2023  Clinic visit to request assistance with obtaining medicines order after 10/24/2023 ER visit.  Per d/c summary prescriptions for medicines were sent to [facility].  Regular medicines for chronic conditions were previously filled at [facility] ([facility]  Called pharmacy and they will have prescriptions transferred from [facility] to [facility] for pickup later today.  Sent secure message to Dr. Brien regarding residents complaint of pain in multiple locations from gout with difficulty ambulating.  Per direction from Dr. Brien given bottle of Ibuprofen  200 mg tablets, reviewed directions from the bottle with resident and who voiced understanding of how to take the medicine. Resident advised to see MD at [facility] clinic Wednesday and to go to ER if symptoms worsen after taking pain medicine. Past Medical History: Past Medical History:  Diagnosis Date   Arthritis    Gout 12/30/2017   Hypertension     Encounter Details:  Community Questionnaire - 10/26/23 0945       Questionnaire   Ask client: Do you give verbal consent for me to treat you today? Yes    Student Assistance N/A    Location Patient Served  [facility]    Encounter Setting CN site    Population Status Unhoused    Insurance Medicaid    Insurance/Financial Assistance Referral N/A    Medication Have Medication Insecurities;Patient Medications Reviewed;Provided Medication Assistance    Medical Provider No    Screening Referrals Made Annual Wellness Visit    Medical Referrals Made [facility] PCP/Clinic    Medical Appointment Completed N/A    CNP Interventions Counsel;Advocate/Support;Navigate Healthcare System;Case Management    Screenings CN Performed N/A    ED Visit Averted Yes    Life-Saving Intervention Made N/A

## 2023-10-27 ENCOUNTER — Encounter: Payer: Self-pay | Admitting: Physician Assistant

## 2023-10-27 ENCOUNTER — Other Ambulatory Visit: Payer: Self-pay | Admitting: Critical Care Medicine

## 2023-10-27 ENCOUNTER — Other Ambulatory Visit: Payer: Self-pay

## 2023-10-27 MED ORDER — ALLOPURINOL 100 MG PO TABS
100.0000 mg | ORAL_TABLET | Freq: Every day | ORAL | 1 refills | Status: DC
  Filled 2023-10-27: qty 90, 90d supply, fill #0

## 2023-10-27 NOTE — Progress Notes (Cosign Needed)
 Pt speaks only [place], interpreter used by phone.   He lost housing because he could not work and problems w/ gout.   Hx gout, was seen in the ER  06/30 because of pain in L elbow and hand, similar to other gout flares. He was given Percocet in the ER, and had colchicine  plus methylprednisolone  sent in, Case Mgr picked them up. He was requested to f/u w/ PCP.  He has been compliant w/ these meds.   His allopurinol  does not have refills >> it was sent in again. It will be picked up by this writer and delivered to him.   He says he is no longer connected to [facility].  He was offered f/u with Dr Megan group >> appt will be made.   PW had previously referred him for a motorized wheelchair, but he never got it, does not know why.   He had previously been referred to Orthopedics, saw Dr Ozell Ada in 2024 and was told to be referred again if the pain got worse. O/w f/u w/ PCP.   He says was prev told by PW not to lift more than 22 kilos. Since then, his gout has worsened and gotten into different joints.   Shona Shad, PA-C 10/27/2023 3:55 PM  I have seen and examined this patient with the mid-level provider and agree with the above note . Belvie Silvan

## 2023-10-28 ENCOUNTER — Other Ambulatory Visit: Payer: Self-pay

## 2023-11-03 ENCOUNTER — Ambulatory Visit: Attending: Nurse Practitioner | Admitting: Nurse Practitioner

## 2023-11-03 ENCOUNTER — Other Ambulatory Visit: Payer: Self-pay | Admitting: Nurse Practitioner

## 2023-11-03 ENCOUNTER — Encounter: Payer: Self-pay | Admitting: Nurse Practitioner

## 2023-11-03 ENCOUNTER — Other Ambulatory Visit: Payer: Self-pay

## 2023-11-03 ENCOUNTER — Telehealth: Payer: Self-pay | Admitting: Nurse Practitioner

## 2023-11-03 VITALS — BP 142/82 | HR 64 | Resp 19 | Ht 67.0 in | Wt 163.6 lb

## 2023-11-03 DIAGNOSIS — Z1211 Encounter for screening for malignant neoplasm of colon: Secondary | ICD-10-CM | POA: Diagnosis not present

## 2023-11-03 DIAGNOSIS — E871 Hypo-osmolality and hyponatremia: Secondary | ICD-10-CM

## 2023-11-03 DIAGNOSIS — D649 Anemia, unspecified: Secondary | ICD-10-CM

## 2023-11-03 DIAGNOSIS — M1A9XX1 Chronic gout, unspecified, with tophus (tophi): Secondary | ICD-10-CM | POA: Diagnosis not present

## 2023-11-03 DIAGNOSIS — I10 Essential (primary) hypertension: Secondary | ICD-10-CM

## 2023-11-03 MED ORDER — PREDNISONE 20 MG PO TABS: 40.0000 mg | ORAL_TABLET | Freq: Every day | ORAL | 0 refills | Status: DC

## 2023-11-03 MED ORDER — VALSARTAN 160 MG PO TABS
160.0000 mg | ORAL_TABLET | Freq: Every day | ORAL | 1 refills | Status: DC
Start: 2023-11-03 — End: 2023-12-15
  Filled 2023-11-03: qty 90, 90d supply, fill #0

## 2023-11-03 MED ORDER — COLCHICINE 0.6 MG PO TABS
ORAL_TABLET | ORAL | 0 refills | Status: DC
Start: 2023-11-03 — End: 2023-11-03
  Filled 2023-11-03: qty 10, 5d supply, fill #0

## 2023-11-03 MED ORDER — VALSARTAN 160 MG PO TABS: 160.0000 mg | ORAL_TABLET | Freq: Every day | ORAL | 1 refills | Status: DC

## 2023-11-03 MED ORDER — AMLODIPINE BESYLATE 10 MG PO TABS
10.0000 mg | ORAL_TABLET | Freq: Every day | ORAL | 1 refills | Status: DC
  Filled 2023-11-03: qty 90, 90d supply, fill #0

## 2023-11-03 MED ORDER — AMLODIPINE BESYLATE 10 MG PO TABS: 10.0000 mg | ORAL_TABLET | Freq: Every day | ORAL | 1 refills | Status: DC

## 2023-11-03 MED ORDER — PREDNISONE 20 MG PO TABS
40.0000 mg | ORAL_TABLET | Freq: Every day | ORAL | 0 refills | Status: AC
Start: 2023-11-03 — End: 2023-11-10
  Filled 2023-11-03: qty 14, 7d supply, fill #0

## 2023-11-03 MED ORDER — ALLOPURINOL 300 MG PO TABS
300.0000 mg | ORAL_TABLET | Freq: Every day | ORAL | 1 refills | Status: DC
  Filled 2023-11-03: qty 90, 90d supply, fill #0

## 2023-11-03 MED ORDER — COLCHICINE 0.6 MG PO TABS
ORAL_TABLET | ORAL | 0 refills | Status: DC
  Filled 2023-11-03: qty 10, fill #0

## 2023-11-03 MED ORDER — COLCHICINE 0.6 MG PO TABS: ORAL_TABLET | ORAL | 0 refills | Status: DC

## 2023-11-03 MED ORDER — ALLOPURINOL 300 MG PO TABS: 300.0000 mg | ORAL_TABLET | Freq: Every day | ORAL | 1 refills | Status: DC

## 2023-11-03 NOTE — Telephone Encounter (Signed)
 Copied from CRM [id number]. Topic: Clinical - Medication Question >> Nov 03, 2023 11:23 AM Myrick T wrote:  Reason for CRM: Ariel from [facility] stated they need the instructions for colchicine  0.6 MG tablet in [place] and [place].

## 2023-11-03 NOTE — Progress Notes (Signed)
 Assessment and Plan  Gout Chronic gout with recent flare-ups due to insufficient allopurinol  dosage. Pain self managed with ibuprofen , but supply depleted. Colchicine  available for acute flare-ups.  - Increased allopurinol  to 300 mg daily. - Prescribed prednisone  for 7 days, no refills. - Instruct to use colchicine  only during flare-ups. - Schedule follow-up in 4-6 weeks to assess treatment response. - Consider rheumatologist referral if pain persists.  Hypertension Elevated blood pressure noted. - Reinforce daily adherence to amlodipine  and valsartan . - Provide medication instructions in [place].  General Health Maintenance Language barrier and transportation issues affect healthcare access. - Provide medication instructions in [place] on prescription bottles. - Assist with obtaining a bus pass for medical appointments.   Primary hypertension -     amLODipine  (NORVASC ) 10 MG tablet; Take 1 tablet (10 mg total) by mouth daily. prendre 1 comprime par voie orale par jour pour la tension arterielle -     valsartan  (DIOVAN ) 160 MG tablet; Take 1 tablet (160 mg total) by mouth daily. prendre 1 comprime par voie orale par jour pour l hypertension arterielle  Colon cancer screening -     Ambulatory referral to Gastroenterology  Gout, tophaceous Lab Results  Component Value Date   LABURIC 9.2 (H) 06/15/2023   Orders: -     Uric Acid -     allopurinol  (ZYLOPRIM ) 300 MG tablet; Take 1 tablet (300 mg total) by mouth daily. prendre 1 comprime par voie orale par jour pour [employer]. -     colchicine  0.6 MG tablet; Prendre 1 comprime, puis un autre comprime 2 heures plus tard. 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 UNIQUEMENT EN CAS DE POUSSE DE GOUTTE. -     predniSONE  (DELTASONE ) 20 MG tablet; Take 2 tablets (40 mg total) by mouth daily with breakfast for 7 days. Prendre 2 comprimes par jour au petit-dejeuner pendant 7 jours. Comprime par jour pour la goutte. Pas  de renouvellement.  Anemia, unspecified type -     CBC with Differential  Hyponatremia -     CMP14+EGFR  Patient has been counseled on age-appropriate routine health concerns for screening and prevention. These are reviewed and up-to-date. Referrals have been placed accordingly. Immunizations are up-to-date or declined.    Subjective:   Chief Complaint  Patient presents with   Establish Care    Gout     Brandon Arias 53 y.o. male presents to office today with complaints of gout pain.  He is a previous patient of Dr. Brien.  He has a past medical history of Arthritis, Gout (12/30/2017), and Hypertension.   History of Present Illness The patient, with gout and hypertension, presents for management of their gout.  He experiences pain primarily in the bilateral elbows and left knee, extending down to the bottom of his feet, with more pronounced pain on the left side.  He has been using ibuprofen  as needed for pain relief but ran out.  He has taken 300 mg in the past however he is unsure why he was switched to a lower dose a few years ago.  Good some days heThey have colchicine  prescribed for gout flare-ups, which they take only when experiencing significant pain despite regular allopurinol  use.  HTN Blood pressure is slightly elevated today.  He is prescribed amlodipine  and valsartan  for hypertension but recently ran out of both medications.  He usually takes his blood pressure medication in the morning BP Readings from Last 3 Encounters:  11/03/23 (!) 142/82  10/27/23 110/72  10/24/23 134/82    He has difficulty understanding English and states that he understands instructions better when they are given verbally in [place]. They also mention transportation challenges and rely on buses for travel.  Review of Systems  Constitutional:  Negative for fever, malaise/fatigue and weight loss.  HENT: Negative.  Negative for nosebleeds.   Eyes: Negative.  Negative for blurred vision,  double vision and photophobia.  Respiratory: Negative.  Negative for cough and shortness of breath.   Cardiovascular: Negative.  Negative for chest pain, palpitations and leg swelling.  Gastrointestinal: Negative.  Negative for heartburn, nausea and vomiting.  Musculoskeletal:  Positive for joint pain. Negative for myalgias.  Neurological: Negative.  Negative for dizziness, focal weakness, seizures and headaches.  Psychiatric/Behavioral: Negative.  Negative for suicidal ideas.     Past Medical History:  Diagnosis Date   Arthritis    Gout 12/30/2017   Hypertension     Past Surgical History:  Procedure Laterality Date   no known surgeries      History reviewed. No pertinent family history.  Social History Reviewed with no changes to be made today.   Outpatient Medications Prior to Visit  Medication Sig Dispense Refill   allopurinol  (ZYLOPRIM ) 100 MG tablet Take 1 tablet (100 mg total) by mouth daily. 90 tablet 1   amLODipine  (NORVASC ) 10 MG tablet Take 1 tablet (10 mg total) by mouth daily. 90 tablet 1   valsartan  (DIOVAN ) 160 MG tablet Take 1 tablet (160 mg total) by mouth daily. 90 tablet 1   ferrous sulfate  325 (65 FE) MG tablet Take 1 tablet (325 mg total) by mouth daily. (Patient not taking: Reported on 11/03/2023) 30 tablet 0   amLODipine  (NORVASC ) 10 MG tablet Take 1 tablet (10 mg total) by mouth daily. (Patient not taking: Reported on 11/03/2023) 90 tablet 1   colchicine  0.6 MG tablet Take 1 tablet followed by another tablet 2 hours later. Repeat again 3 days later if still having pain. (Patient not taking: Reported on 11/03/2023) 10 tablet 0   cyclobenzaprine  (FLEXERIL ) 10 MG tablet Take 1 tablet (10 mg total) by mouth 3 (three) times daily as needed for muscle spasms. (Patient not taking: Reported on 11/03/2023) 90 tablet 2   indomethacin  (INDOCIN ) 25 MG capsule Take 1 capsule (25 mg total) by mouth 3 (three) times daily with meals. Until bottle empty and STOP (Patient not taking:  Reported on 11/03/2023) 60 capsule 0   methylPREDNISolone  (MEDROL  DOSEPAK) 4 MG TBPK tablet Take per package instructions. (Patient not taking: Reported on 11/03/2023) 21 tablet 0   polyethylene glycol powder (GLYCOLAX /MIRALAX ) 17 GM/SCOOP powder Take 17 g by mouth daily. (Patient not taking: Reported on 11/03/2023) 3350 g 1   valsartan  (DIOVAN ) 160 MG tablet Take 1 tablet (160 mg total) by mouth daily. (Patient not taking: Reported on 11/03/2023) 90 tablet 3   No facility-administered medications prior to visit.    No Known Allergies     Objective:    BP (!) 142/82 (BP Location: Left Arm, Patient Position: Sitting, Cuff Size: Normal)   Pulse 64   Resp 19   Ht 5' 7 (1.702 m)   Wt 163 lb 9.6 oz (74.2 kg)   SpO2 100%   BMI 25.62 kg/m  Wt Readings from Last 3 Encounters:  11/03/23 163 lb 9.6 oz (74.2 kg)  09/11/23 171 lb (77.6 kg)  12/06/22 158 lb 11.7 oz (72 kg)    Physical Exam Vitals and nursing note reviewed.  Constitutional:  Appearance: He is well-developed.  HENT:     Head: Normocephalic and atraumatic.  Cardiovascular:     Rate and Rhythm: Normal rate and regular rhythm.     Heart sounds: Normal heart sounds. No murmur heard.    No friction rub. No gallop.  Pulmonary:     Effort: Pulmonary effort is normal. No tachypnea or respiratory distress.     Breath sounds: Normal breath sounds. No decreased breath sounds, wheezing, rhonchi or rales.  Chest:     Chest wall: No tenderness.  Abdominal:     General: Bowel sounds are normal.     Palpations: Abdomen is soft.  Musculoskeletal:        General: Normal range of motion.     Right elbow: Deformity present. Tenderness present.     Left elbow: Deformity present. Tenderness present.     Cervical back: Normal range of motion.     Left knee: Swelling present.  Skin:    General: Skin is warm and dry.  Neurological:     Mental Status: He is alert and oriented to person, place, and time.     Coordination: Coordination  normal.  Psychiatric:        Behavior: Behavior normal. Behavior is cooperative.        Thought Content: Thought content normal.        Judgment: Judgment normal.          Patient has been counseled extensively about nutrition and exercise as well as the importance of adherence with medications and regular follow-up. The patient was given clear instructions to go to ER or return to medical center if symptoms don't improve, worsen or new problems develop. The patient verbalized understanding.   Follow-up: Return for 6 weeks gout. Needs 3 bus passes today.   Brandon LELON Servant, FNP-BC [facility] [place], [place] [phone]   11/03/2023, 5:10 PM

## 2023-11-03 NOTE — Patient Instructions (Signed)
 [facility] Located in: [facility] [address] Address: [address], [place], [place] [address] Phone: [phone]

## 2023-11-03 NOTE — Telephone Encounter (Signed)
New script has been sent

## 2023-11-04 ENCOUNTER — Other Ambulatory Visit: Payer: Self-pay

## 2023-11-04 LAB — CBC WITH DIFFERENTIAL/PLATELET
Basophils Absolute: 0 x10E3/uL (ref 0.0–0.2)
Basos: 1 %
EOS (ABSOLUTE): 0.1 x10E3/uL (ref 0.0–0.4)
Eos: 1 %
Hematocrit: 30.8 % — ABNORMAL LOW (ref 37.5–51.0)
Hemoglobin: 9.1 g/dL — ABNORMAL LOW (ref 13.0–17.7)
Immature Grans (Abs): 0 x10E3/uL (ref 0.0–0.1)
Immature Granulocytes: 0 %
Lymphocytes Absolute: 1.6 x10E3/uL (ref 0.7–3.1)
Lymphs: 28 %
MCH: 28.1 pg (ref 26.6–33.0)
MCHC: 29.5 g/dL — ABNORMAL LOW (ref 31.5–35.7)
MCV: 95 fL (ref 79–97)
Monocytes Absolute: 1 x10E3/uL — ABNORMAL HIGH (ref 0.1–0.9)
Monocytes: 18 %
Neutrophils Absolute: 2.9 x10E3/uL (ref 1.4–7.0)
Neutrophils: 52 %
Platelets: 185 x10E3/uL (ref 150–450)
RBC: 3.24 x10E6/uL — ABNORMAL LOW (ref 4.14–5.80)
RDW: 19 % — ABNORMAL HIGH (ref 11.6–15.4)
WBC: 5.6 x10E3/uL (ref 3.4–10.8)

## 2023-11-04 LAB — CMP14+EGFR
ALT: 63 IU/L — ABNORMAL HIGH (ref 0–44)
AST: 108 IU/L — ABNORMAL HIGH (ref 0–40)
Albumin: 3.3 g/dL — ABNORMAL LOW (ref 3.8–4.9)
Alkaline Phosphatase: 205 IU/L — ABNORMAL HIGH (ref 44–121)
BUN/Creatinine Ratio: 17 (ref 9–20)
BUN: 15 mg/dL (ref 6–24)
Bilirubin Total: 0.9 mg/dL (ref 0.0–1.2)
CO2: 20 mmol/L (ref 20–29)
Calcium: 8.5 mg/dL — ABNORMAL LOW (ref 8.7–10.2)
Chloride: 103 mmol/L (ref 96–106)
Creatinine, Ser: 0.88 mg/dL (ref 0.76–1.27)
Globulin, Total: 4.6 g/dL — ABNORMAL HIGH (ref 1.5–4.5)
Glucose: 81 mg/dL (ref 70–99)
Potassium: 4.2 mmol/L (ref 3.5–5.2)
Sodium: 137 mmol/L (ref 134–144)
Total Protein: 7.9 g/dL (ref 6.0–8.5)
eGFR: 103 mL/min/1.73 (ref >59)

## 2023-11-04 LAB — URIC ACID: Uric Acid: 8.4 mg/dL (ref 3.8–8.4)

## 2023-11-04 NOTE — Telephone Encounter (Signed)
 Noted. Pharmacy tech aware.

## 2023-11-08 ENCOUNTER — Ambulatory Visit: Payer: Self-pay | Admitting: Nurse Practitioner

## 2023-11-08 DIAGNOSIS — R7989 Other specified abnormal findings of blood chemistry: Secondary | ICD-10-CM

## 2023-11-22 ENCOUNTER — Telehealth: Payer: Self-pay | Admitting: Nurse Practitioner

## 2023-11-22 NOTE — Telephone Encounter (Signed)
 Nidia Larsen from [employer], called regarding this patient needs to apply for disability and requires a letter from PCP. Could you please call [facility] regarding this.  Call [place] at [phone]  [facility]

## 2023-11-26 ENCOUNTER — Encounter: Payer: Self-pay | Admitting: Nurse Practitioner

## 2023-11-26 NOTE — Telephone Encounter (Signed)
 Brandon Arias has given address to mail letter to. Patient has agreed.

## 2023-11-26 NOTE — Telephone Encounter (Signed)
 Letter is in [place]. Thank you

## 2023-12-04 ENCOUNTER — Encounter (HOSPITAL_COMMUNITY): Payer: Self-pay

## 2023-12-04 ENCOUNTER — Emergency Department (HOSPITAL_COMMUNITY)
Admission: EM | Admit: 2023-12-04 | Discharge: 2023-12-04 | Disposition: A | Discharge status: Home / Self Care | Attending: Emergency Medicine | Admitting: Emergency Medicine

## 2023-12-04 ENCOUNTER — Emergency Department (HOSPITAL_COMMUNITY)

## 2023-12-04 DIAGNOSIS — M62838 Other muscle spasm: Secondary | ICD-10-CM | POA: Diagnosis not present

## 2023-12-04 DIAGNOSIS — M130 Polyarthritis, unspecified: Secondary | ICD-10-CM | POA: Diagnosis not present

## 2023-12-04 DIAGNOSIS — M2550 Pain in unspecified joint: Secondary | ICD-10-CM

## 2023-12-04 DIAGNOSIS — H9202 Otalgia, left ear: Secondary | ICD-10-CM | POA: Insufficient documentation

## 2023-12-04 DIAGNOSIS — M1A9XX1 Chronic gout, unspecified, with tophus (tophi): Secondary | ICD-10-CM

## 2023-12-04 LAB — COMPREHENSIVE METABOLIC PANEL WITH GFR
ALT: 30 U/L (ref 0–44)
AST: 55 U/L — ABNORMAL HIGH (ref 15–41)
Albumin: 2.9 g/dL — ABNORMAL LOW (ref 3.5–5.0)
Alkaline Phosphatase: 152 U/L — ABNORMAL HIGH (ref 38–126)
Anion gap: 11 (ref 5–15)
BUN: 12 mg/dL (ref 6–20)
CO2: 19 mmol/L — ABNORMAL LOW (ref 22–32)
Calcium: 8.4 mg/dL — ABNORMAL LOW (ref 8.9–10.3)
Chloride: 105 mmol/L (ref 98–111)
Creatinine, Ser: 0.94 mg/dL (ref 0.61–1.24)
GFR, Estimated: >60 mL/min (ref >60)
Glucose, Bld: 107 mg/dL — ABNORMAL HIGH (ref 70–99)
Potassium: 3.7 mmol/L (ref 3.5–5.1)
Sodium: 135 mmol/L (ref 135–145)
Total Bilirubin: 0.9 mg/dL (ref 0.0–1.2)
Total Protein: 7.7 g/dL (ref 6.5–8.1)

## 2023-12-04 LAB — URINALYSIS, W/ REFLEX TO CULTURE (INFECTION SUSPECTED)
Bacteria, UA: NONE SEEN
Bilirubin Urine: NEGATIVE
Glucose, UA: NEGATIVE mg/dL
Hgb urine dipstick: NEGATIVE
Ketones, ur: NEGATIVE mg/dL
Leukocytes,Ua: NEGATIVE
Nitrite: NEGATIVE
Protein, ur: NEGATIVE mg/dL
Specific Gravity, Urine: 1.009 (ref 1.005–1.030)
pH: 7 (ref 5.0–8.0)

## 2023-12-04 LAB — CBC WITH DIFFERENTIAL/PLATELET
Basophils Absolute: 0 K/uL (ref 0.0–0.1)
Basophils Relative: 0 %
Eosinophils Absolute: 0 K/uL (ref 0.0–0.5)
Eosinophils Relative: 0 %
HCT: 27.3 % — ABNORMAL LOW (ref 39.0–52.0)
Hemoglobin: 8.6 g/dL — ABNORMAL LOW (ref 13.0–17.0)
Lymphocytes Relative: 26 %
Lymphs Abs: 2.1 K/uL (ref 0.7–4.0)
MCH: 28.1 pg (ref 26.0–34.0)
MCHC: 31.5 g/dL (ref 30.0–36.0)
MCV: 89.2 fL (ref 80.0–100.0)
Monocytes Absolute: 0.8 K/uL (ref 0.1–1.0)
Monocytes Relative: 10 %
Neutro Abs: 5.1 K/uL (ref 1.7–7.7)
Neutrophils Relative %: 64 %
Platelets: 157 K/uL (ref 150–400)
RBC: 3.06 MIL/uL — ABNORMAL LOW (ref 4.22–5.81)
RDW: 23.4 % — ABNORMAL HIGH (ref 11.5–15.5)
WBC: 8 K/uL (ref 4.0–10.5)
nRBC: 0.3 % — ABNORMAL HIGH (ref 0.0–0.2)

## 2023-12-04 LAB — I-STAT CG4 LACTIC ACID, ED: Lactic Acid, Venous: 3.3 mmol/L (ref 0.5–1.9)

## 2023-12-04 MED ORDER — METHOCARBAMOL 500 MG PO TABS
500.0000 mg | ORAL_TABLET | Freq: Two times a day (BID) | ORAL | 0 refills | Status: DC
  Filled 2023-12-04: qty 10, 5d supply, fill #0

## 2023-12-04 MED ORDER — LIDOCAINE 5 % EX PTCH
1.0000 | MEDICATED_PATCH | CUTANEOUS | Status: DC
  Administered 2023-12-04: 1 via TRANSDERMAL
  Filled 2023-12-04: qty 1

## 2023-12-04 MED ORDER — COLCHICINE 0.6 MG PO TABS
0.6000 mg | ORAL_TABLET | Freq: Two times a day (BID) | ORAL | 0 refills | Status: DC
  Filled 2023-12-04: qty 10, 5d supply, fill #0

## 2023-12-04 MED ORDER — COLCHICINE 0.6 MG PO TABS
1.2000 mg | ORAL_TABLET | Freq: Once | ORAL | Status: AC
  Administered 2023-12-04: 1.2 mg via ORAL
  Filled 2023-12-04: qty 2

## 2023-12-04 NOTE — ED Triage Notes (Signed)
 Pt now stating he is here because he has L ear pain and neck pain and is wondering if the medication he has for gout will help it.  Pt is requesting an ointment to help with his neck pain.

## 2023-12-04 NOTE — ED Provider Notes (Signed)
 [place] EMERGENCY DEPARTMENT AT [facility] Provider Note   CSN: [id number] Arrival date & time: 12/04/23  1559     Patient presents with: Leg Swelling and Otalgia   Brandon Arias is a 53 y.o. male.   HPI    53 year old male comes in with chief complaint of multiple joint pain and ear pain. Patient states that his left ear is bothering him for the last 3 days.  He also has noted swelling to both of his legs, knee and left wrist for the last 3 days.  He has been told that he has gout.  Additionally, patient complains of pain in the neck, worse with any movement.  He denies any headache, nausea, vomiting, fevers, chills.   Prior to Admission medications   Medication Sig Start Date End Date Taking? Authorizing Provider  colchicine  0.6 MG tablet Take 1 tablet (0.6 mg total) by mouth 2 (two) times daily. 12/04/23  Yes Charlyn Sora, MD  methocarbamol  (ROBAXIN ) 500 MG tablet Take 1 tablet (500 mg total) by mouth 2 (two) times daily. 12/04/23  Yes Charlyn Sora, MD  allopurinol  (ZYLOPRIM ) 300 MG tablet Take 1 tablet (300 mg total) by mouth daily. prendre 1 comprime par voie orale par jour pour [employer]. 11/03/23   Fleming, Zelda W, NP  amLODipine  (NORVASC ) 10 MG tablet Take 1 tablet (10 mg total) by mouth daily. prendre 1 comprime par voie orale par jour pour la tension arterielle 11/03/23   Fleming, Zelda W, NP  ferrous sulfate  325 (65 FE) MG tablet Take 1 tablet (325 mg total) by mouth daily. Patient not taking: Reported on 11/03/2023 07/10/22   Freddi Hamilton, MD  valsartan  (DIOVAN ) 160 MG tablet Take 1 tablet (160 mg total) by mouth daily. prendre 1 comprime par voie orale par jour pour l hypertension arterielle 11/03/23   Fleming, Zelda W, NP    Allergies: Patient has no known allergies.    Review of Systems  All other systems reviewed and are negative.   Updated Vital Signs BP 139/84   Pulse 75   Temp 98 F (36.7 C)   Resp 16   SpO2 100%   Physical Exam Vitals  and nursing note reviewed.  Constitutional:      Appearance: He is well-developed.  HENT:     Head: Atraumatic.     Right Ear: Tympanic membrane normal.     Left Ear: Tympanic membrane normal.  Eyes:     Pupils: Pupils are equal, round, and reactive to light.  Neck:     Comments: Reproducible tenderness over the left paraspinal cervical region Cardiovascular:     Rate and Rhythm: Normal rate.  Pulmonary:     Effort: Pulmonary effort is normal.  Musculoskeletal:     Cervical back: Rigidity and tenderness present.     Comments: Left wrist appears erythematous, tenderness to palpation.  Patient able to actively flex and extend the wrist.  He is able to flex and extend the knee and also able to ambulate.  He has tenderness over the leg, no clear edema noted  Skin:    General: Skin is warm.  Neurological:     Mental Status: He is alert and oriented to person, place, and time.     (all labs ordered are listed, but only abnormal results are displayed) Labs Reviewed  COMPREHENSIVE METABOLIC PANEL WITH GFR - Abnormal; Notable for the following components:      Result Value   CO2 19 (*)    Glucose,  Bld 107 (*)    Calcium 8.4 (*)    Albumin 2.9 (*)    AST 55 (*)    Alkaline Phosphatase 152 (*)    All other components within normal limits  CBC WITH DIFFERENTIAL/PLATELET - Abnormal; Notable for the following components:   RBC 3.06 (*)    Hemoglobin 8.6 (*)    HCT 27.3 (*)    RDW 23.4 (*)    nRBC 0.3 (*)    All other components within normal limits  URINALYSIS, W/ REFLEX TO CULTURE (INFECTION SUSPECTED)  I-STAT CG4 LACTIC ACID, ED  I-STAT CG4 LACTIC ACID, ED    EKG: None  Radiology: DG Chest 2 View Result Date: 12/04/2023 CLINICAL DATA:  Leg swelling.  History of gout. EXAM: CHEST - 2 VIEW COMPARISON:  07/08/2022. FINDINGS: The heart is borderline enlarged and mediastinal contours are within normal limits. No consolidation, effusion, or pneumothorax is seen. No acute osseous  abnormality. IMPRESSION: No active cardiopulmonary disease. Electronically Signed   By: Leita Birmingham M.D.   On: 12/04/2023 17:24     Procedures   Medications Ordered in the ED  lidocaine  (LIDODERM ) 5 % 1 patch (1 patch Transdermal Patch Applied 12/04/23 1915)  colchicine  tablet 1.2 mg (has no administration in time range)                                    Medical Decision Making Amount and/or Complexity of Data Reviewed Labs: ordered. Radiology: ordered.  Risk Prescription drug management.   53 year old male comes in with chief complaint of leg swelling and pain, wrist pain and swelling and left earache, neck pain.  He has history of gout.  Patient is nontoxic appearing.  Vital signs are stable and within normal limits.  No SIRS.  TM are normal.  Patient's wrist does look slightly edematous, but he is able to actively range it.  He reports knee swelling, but they look symmetric.  He is ambulating.  Differential diagnosis is gout, MSK pain of the neck/cervical spasms, otitis media.  Plan is to put him on colchicine , give muscle relaxant.  No clear signs of infection with left ear evaluation.  Will not give him antibiotics for now but discussed return precautions   Final diagnoses:  Polyarthralgia  Muscle spasms of neck    ED Discharge Orders          Ordered    colchicine  0.6 MG tablet  2 times daily        12/04/23 1923    methocarbamol  (ROBAXIN ) 500 MG tablet  2 times daily        12/04/23 1923               Charlyn Sora, MD 12/04/23 1932

## 2023-12-04 NOTE — ED Triage Notes (Signed)
 Pt presents to ED from home C/O joint swelling X 3 days. Pt states he came here and was dx with gout but is now out of medications and need some. Pt also reports BLE swelling to the point that he cannot get his pants on. Pt is a poor historian and it is very difficult to get a straight answer when he is asked questions.    Interpreter # [phone] used for triage.

## 2023-12-04 NOTE — ED Triage Notes (Signed)
 Pt now reporting he is here because he is homeless and needs a place to stay instead of the street. Denies SI/HI.

## 2023-12-05 ENCOUNTER — Encounter (HOSPITAL_COMMUNITY): Payer: Self-pay

## 2023-12-05 ENCOUNTER — Emergency Department (HOSPITAL_COMMUNITY)
Admission: EM | Admit: 2023-12-05 | Discharge: 2023-12-05 | Disposition: A | Discharge status: Home / Self Care | Attending: Emergency Medicine | Admitting: Emergency Medicine

## 2023-12-05 DIAGNOSIS — Z760 Encounter for issue of repeat prescription: Secondary | ICD-10-CM | POA: Insufficient documentation

## 2023-12-05 DIAGNOSIS — M25462 Effusion, left knee: Secondary | ICD-10-CM | POA: Insufficient documentation

## 2023-12-05 DIAGNOSIS — M25422 Effusion, left elbow: Secondary | ICD-10-CM | POA: Diagnosis not present

## 2023-12-05 MED ORDER — METHOCARBAMOL 500 MG PO TABS: 500.0000 mg | ORAL_TABLET | Freq: Two times a day (BID) | ORAL | 0 refills | Status: DC

## 2023-12-05 MED ORDER — COLCHICINE 0.6 MG PO TABS: 0.6000 mg | ORAL_TABLET | Freq: Two times a day (BID) | ORAL | 0 refills | Status: DC

## 2023-12-05 NOTE — Discharge Instructions (Signed)
 [facility] pour rcuprer [employer]. Consultez votre mdecin traitant [employer] jours.  ----  Go to the pharmacy listed to pick up your medications. Follow-up with your primary doctor in several days.

## 2023-12-05 NOTE — ED Triage Notes (Signed)
 [place] interpreter used for triage:  Pt was seen here yesterday and given rx but states the pharmacy it was sent to was under construction so he could not pick it up. Pt wants his meds sent to the [facility] on [address].

## 2023-12-05 NOTE — ED Provider Notes (Signed)
 [place] EMERGENCY DEPARTMENT AT [facility] Provider Note   CSN: [id number] Arrival date & time: 12/05/23  1313     Patient presents with: Medication Refill   Brandon Arias is a 53 y.o. male.  {Add pertinent medical, surgical, social history, OB history to YEP:67052}   Gout msk pain colchicine  and methocarbamol  Joint and ear pain knee and wrist hx of gout for 3 days L elbow and knee  [place] interpreter used for triage:  Pt was seen here yesterday and given rx but states the pharmacy it was  sent to was under construction so he could not pick it up. Pt wants his  meds sent to the [facility] on [address].  Past Medical History: No date: Arthritis 12/30/2017: Gout No date: Hypertension        Prior to Admission medications   Medication Sig Start Date End Date Taking? Authorizing Provider  allopurinol  (ZYLOPRIM ) 300 MG tablet Take 1 tablet (300 mg total) by mouth daily. prendre 1 comprime par voie orale par jour pour [employer]. 11/03/23   Fleming, Zelda W, NP  amLODipine  (NORVASC ) 10 MG tablet Take 1 tablet (10 mg total) by mouth daily. prendre 1 comprime par voie orale par jour pour la tension arterielle 11/03/23   Fleming, Zelda W, NP  colchicine  0.6 MG tablet Take 1 tablet (0.6 mg total) by mouth 2 (two) times daily. 12/04/23   Charlyn Sora, MD  ferrous sulfate  325 (65 FE) MG tablet Take 1 tablet (325 mg total) by mouth daily. Patient not taking: Reported on 11/03/2023 07/10/22   Freddi Hamilton, MD  methocarbamol  (ROBAXIN ) 500 MG tablet Take 1 tablet (500 mg total) by mouth 2 (two) times daily. 12/04/23   Charlyn Sora, MD  valsartan  (DIOVAN ) 160 MG tablet Take 1 tablet (160 mg total) by mouth daily. prendre 1 comprime par voie orale par jour pour l hypertension arterielle 11/03/23   Fleming, Zelda W, NP    Allergies: Patient has no known allergies.    Review of Systems  Updated Vital Signs BP (!) 157/85   Pulse 88   Temp 97.8 F (36.6 C) (Oral)   Resp 18    SpO2 100%   Physical Exam  (all labs ordered are listed, but only abnormal results are displayed) Labs Reviewed - No data to display  EKG: None  Radiology: DG Chest 2 View Result Date: 12/04/2023 CLINICAL DATA:  Leg swelling.  History of gout. EXAM: CHEST - 2 VIEW COMPARISON:  07/08/2022. FINDINGS: The heart is borderline enlarged and mediastinal contours are within normal limits. No consolidation, effusion, or pneumothorax is seen. No acute osseous abnormality. IMPRESSION: No active cardiopulmonary disease. Electronically Signed   By: Leita Birmingham M.D.   On: 12/04/2023 17:24    {Document cardiac monitor, telemetry assessment procedure when appropriate:32947} Procedures   Medications Ordered in the ED - No data to display    {Click here for ABCD2, HEART and other calculators REFRESH Note before signing:1}                              Medical Decision Making Risk Prescription drug management.   ***  {Document critical care time when appropriate  Document review of labs and clinical decision tools ie CHADS2VASC2, etc  Document your independent review of radiology images and any outside records  Document your discussion with family members, caretakers and with consultants  Document social determinants of health affecting pt's care  Document  your decision making why or why not admission, treatments were needed:32947:::1}   Final diagnoses:  None    ED Discharge Orders     None

## 2023-12-06 ENCOUNTER — Other Ambulatory Visit: Payer: Self-pay

## 2023-12-07 ENCOUNTER — Ambulatory Visit

## 2023-12-07 LAB — GLUCOSE, POCT (MANUAL RESULT ENTRY): POC Glucose: 85 mg/dL (ref 70–99)

## 2023-12-07 NOTE — Progress Notes (Unsigned)
 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  Chief Complaint: dry eyes Living Situation: unhoused Insurance Status:  Medicaid    Patient Status:  [x]  Returning patient to [employer]  Confirmed demographics and emergency contact info  HIPAA and privacy policies previously reviewed  Consent for digital charting: [x]  Previously Signed []  Signed Today []  Not Signed  Additional Notes:  Vital signs taken and entered in flowsheet  Interpreter services: [x]  Needed []  Not Needed  Brief SDOH update completed  Referral to provider: []  Needed [x]  Not Needed  RN Interventions Provided Today:  [x]  Health education (e.g., chronic disease, hygiene, nutrition)  []  Wound care performed  []  Flu vaccine administered  [x]  Other: Patient provided socks and shirts.

## 2023-12-13 ENCOUNTER — Encounter: Payer: Self-pay | Admitting: *Deleted

## 2023-12-13 NOTE — Congregational Nurse Program (Signed)
  Dept: [phone]   Congregational Nurse Program Note  Date of Encounter: 12/13/2023  Past Medical History: Past Medical History:  Diagnosis Date   Arthritis    Gout 12/30/2017   Hypertension     Encounter Details:  Community Questionnaire - 12/13/23 1048       Questionnaire   Ask client: Do you give verbal consent for me to treat you today? Yes    Student Assistance N/A    Location Patient Served  [facility]    Encounter Setting CN site    Population Status Unhoused    Insurance Medicaid    Insurance/Financial Assistance Referral N/A    Medication N/A    Medical Provider Yes    Screening Referrals Made N/A    Medical Referrals Made N/A    Medical Appointment Completed N/A    CNP Interventions Advocate/Support;Case Management    Screenings CN Performed N/A    ED Visit Averted N/A    Life-Saving Intervention Made N/A         Client seen in parking lot of [facility] asking about upcoming doctor's appointment. Information obtained from epic. Written and verbal information given to client about upcoming PCP appt Aug 20th with Zelda Fleming.  Timothy Trudell W RN CN

## 2023-12-15 ENCOUNTER — Encounter: Payer: Self-pay | Admitting: Nurse Practitioner

## 2023-12-15 ENCOUNTER — Other Ambulatory Visit: Payer: Self-pay

## 2023-12-15 ENCOUNTER — Ambulatory Visit: Attending: Nurse Practitioner | Admitting: Nurse Practitioner

## 2023-12-15 VITALS — BP 149/76 | HR 75 | Resp 19 | Ht 67.0 in | Wt 161.2 lb

## 2023-12-15 DIAGNOSIS — R7989 Other specified abnormal findings of blood chemistry: Secondary | ICD-10-CM

## 2023-12-15 DIAGNOSIS — I10 Essential (primary) hypertension: Secondary | ICD-10-CM

## 2023-12-15 DIAGNOSIS — M5416 Radiculopathy, lumbar region: Secondary | ICD-10-CM

## 2023-12-15 DIAGNOSIS — M1A9XX1 Chronic gout, unspecified, with tophus (tophi): Secondary | ICD-10-CM | POA: Diagnosis not present

## 2023-12-15 DIAGNOSIS — E559 Vitamin D deficiency, unspecified: Secondary | ICD-10-CM

## 2023-12-15 MED ORDER — ALLOPURINOL 300 MG PO TABS
300.0000 mg | ORAL_TABLET | Freq: Every day | ORAL | 1 refills | Status: DC
Start: 2023-12-15 — End: 2024-03-17
  Filled 2023-12-15: qty 90, 90d supply, fill #0

## 2023-12-15 MED ORDER — VALSARTAN 160 MG PO TABS
160.0000 mg | ORAL_TABLET | Freq: Every day | ORAL | 1 refills | Status: DC
  Filled 2023-12-15: qty 90, 90d supply, fill #0

## 2023-12-15 MED ORDER — AMLODIPINE BESYLATE 10 MG PO TABS
10.0000 mg | ORAL_TABLET | Freq: Every day | ORAL | 1 refills | Status: DC
  Filled 2023-12-15: qty 90, 90d supply, fill #0

## 2023-12-15 MED ORDER — INDOMETHACIN ER 75 MG PO CPCR
75.0000 mg | ORAL_CAPSULE | Freq: Two times a day (BID) | ORAL | 1 refills | Status: DC
  Filled 2023-12-15: qty 60, 30d supply, fill #0

## 2023-12-15 NOTE — Patient Instructions (Addendum)
 [facility] for colonoscopy COLONOSCOPY [address] [place], [place] [address] PH# [phone]     Maya B. Dolphus, MD GOUT/ARTHRITIS Rheumatologist in [place], [place]  Address: [address], [place], [place] [address] Phone: [phone]   Orthopedist BACK  [facility] LABOR. Georgina, MD Orthopedic surgeon in [place], [place]  Address: [address] [place], [place] [address] Phone: [phone]

## 2023-12-15 NOTE — Progress Notes (Signed)
 Assessment & Plan:  Brandon Arias was seen today for gout.  Diagnoses and all orders for this visit:  Primary hypertension -     amLODipine  (NORVASC ) 10 MG tablet; Take 1 tablet (10 mg total) by mouth daily. prendre 1 comprime par voie orale par jour pour la tension arterielle -     valsartan  (DIOVAN ) 160 MG tablet; Take 1 tablet (160 mg total) by mouth daily. prendre 1 comprime par voie orale par jour pour l hypertension arterielle Continue all antihypertensives as prescribed.  Reminded to bring in blood pressure log for follow  up appointment.  RECOMMENDATIONS: DASH/Mediterranean Diets are healthier choices for HTN.    Gout, tophaceous Comments: 07/30/21: Uric acid 7.9, ESR 41, CRP 1.4. Allopurinol  200 mg daily.  Orders: -     Arthritis Panel -     Ambulatory referral to Rheumatology -     Uric Acid -     CBC with Differential -     indomethacin  (INDOCIN  SR) 75 MG CR capsule; Take 1 capsule (75 mg total) by mouth 2 (two) times daily with a meal. prendre 1 comprime par voie orale par jour pour [employer]. -     allopurinol  (ZYLOPRIM ) 300 MG tablet; Take 1 tablet (300 mg total) by mouth daily. prendre 1 comprime par voie orale par jour pour [employer]. Gout with elevated inflammatory markers managed with allopurinol  and colchicine . - Refer to rheumatologist for further evaluation and management. - Refill colchicine  for flares, limited quantity. - Ensure allopurinol  prescription is current. - Provide pain relief for gout pain.   Lumbar radiculopathy -     Ambulatory referral to Orthopedic Surgery Dr. Ozell Ada Chronic back pain affecting multiple spinal areas. - Refer to specialist for evaluation. Chronic hip pain possibly related to joint issues.   Elevated liver function tests -     Acute Hep Panel & Hep B Surface Ab -     HCV Ab w Reflex to Quant PCR  Vitamin D  deficiency disease -     VITAMIN D  25 Hydroxy (Vit-D Deficiency, Fractures) -     PTH, Intact and  Calcium     Patient has been counseled on age-appropriate routine health concerns for screening and prevention. These are reviewed and up-to-date. Referrals have been placed accordingly. Immunizations are up-to-date or declined.    Subjective:   Chief Complaint  Patient presents with   Gout    History of Present Illness Brandon Arias is a 53 year old male with gout and HTN who presents for gout follow up with concerns about high inflammatory markers and spine issues.  His bloodwork last year ordered by another primary care office were abnormal with elevated inflammatory markers. He was referred but did not follow up with a rheumatologist due to lack of specific contact information and language barriers with messages being left.  He has a history of gout, experiencing pain particularly in cold weather, described as a prickling sensation. He has large nodules on both elbows. He is currently taking allopurinol  300 mg daily for gout management and uses colchicine  for flare-ups as needed. He states his pain is not relieved with this regimen  He reports issues with his spine, mentioning three areas described as 'pierced' and problems with his hip joints. He has missed previous appointments with the orthopedist for these issues.  He faces language barriers and transportation challenges, impacting his ability to follow up with specialists. He relies on his children for assistance with communication and scheduling appointments.  HTN Blood pressure is elevated. He does not have his amlodipine  or valsartan  with him today.  BP Readings from Last 3 Encounters:  12/15/23 (!) 149/76  12/07/23 123/79  12/05/23 (!) 160/93     Review of Systems  Constitutional:  Negative for fever, malaise/fatigue and weight loss.  HENT: Negative.  Negative for nosebleeds.   Eyes: Negative.  Negative for blurred vision, double vision and photophobia.  Respiratory: Negative.  Negative for cough and shortness of  breath.   Cardiovascular: Negative.  Negative for chest pain, palpitations and leg swelling.  Gastrointestinal: Negative.  Negative for heartburn, nausea and vomiting.  Musculoskeletal:  Positive for back pain and joint pain. Negative for myalgias.  Neurological: Negative.  Negative for dizziness, focal weakness, seizures and headaches.  Psychiatric/Behavioral: Negative.  Negative for suicidal ideas.     Past Medical History:  Diagnosis Date   Arthritis    Gout 12/30/2017   Hypertension     Past Surgical History:  Procedure Laterality Date   no known surgeries      History reviewed. No pertinent family history.  Social History Reviewed with no changes to be made today.   Outpatient Medications Prior to Visit  Medication Sig Dispense Refill   allopurinol  (ZYLOPRIM ) 300 MG tablet Take 1 tablet (300 mg total) by mouth daily. prendre 1 comprime par voie orale par jour pour [employer]. 90 tablet 1   colchicine  0.6 MG tablet Take 1 tablet (0.6 mg total) by mouth 2 (two) times daily. (Patient not taking: Reported on 12/15/2023) 10 tablet 0   ferrous sulfate  325 (65 FE) MG tablet Take 1 tablet (325 mg total) by mouth daily. (Patient not taking: Reported on 12/15/2023) 30 tablet 0   methocarbamol  (ROBAXIN ) 500 MG tablet Take 1 tablet (500 mg total) by mouth 2 (two) times daily. 10 tablet 0   amLODipine  (NORVASC ) 10 MG tablet Take 1 tablet (10 mg total) by mouth daily. prendre 1 comprime par voie orale par jour pour la tension arterielle (Patient not taking: Reported on 12/15/2023) 90 tablet 1   valsartan  (DIOVAN ) 160 MG tablet Take 1 tablet (160 mg total) by mouth daily. prendre 1 comprime par voie orale par jour pour l hypertension arterielle (Patient not taking: Reported on 12/15/2023) 90 tablet 1   No facility-administered medications prior to visit.    No Known Allergies     Objective:    BP (!) 149/76 (BP Location: Left Arm, Patient Position: Sitting, Cuff Size: Normal)   Pulse 75    Resp 19   Ht 5' 7 (1.702 m)   Wt 161 lb 3.2 oz (73.1 kg)   SpO2 100%   BMI 25.25 kg/m  Wt Readings from Last 3 Encounters:  12/15/23 161 lb 3.2 oz (73.1 kg)  12/07/23 167 lb (75.8 kg)  11/03/23 163 lb 9.6 oz (74.2 kg)    Physical Exam Vitals and nursing note reviewed.  Constitutional:      Appearance: He is well-developed.  HENT:     Head: Normocephalic and atraumatic.  Cardiovascular:     Rate and Rhythm: Normal rate and regular rhythm.     Heart sounds: Normal heart sounds. No murmur heard.    No friction rub. No gallop.  Pulmonary:     Effort: Pulmonary effort is normal. No tachypnea or respiratory distress.     Breath sounds: Normal breath sounds. No decreased breath sounds, wheezing, rhonchi or rales.  Chest:     Chest wall: No tenderness.  Abdominal:  General: Bowel sounds are normal.     Palpations: Abdomen is soft.  Musculoskeletal:        General: Normal range of motion.     Right elbow: Deformity present. Tenderness present in olecranon process.     Left elbow: Deformity present. Tenderness present in olecranon process.     Cervical back: Normal range of motion.  Skin:    General: Skin is warm and dry.  Neurological:     Mental Status: He is alert and oriented to person, place, and time.     Coordination: Coordination normal.  Psychiatric:        Behavior: Behavior normal. Behavior is cooperative.        Thought Content: Thought content normal.        Judgment: Judgment normal.          Patient has been counseled extensively about nutrition and exercise as well as the importance of adherence with medications and regular follow-up. The patient was given clear instructions to go to ER or return to medical center if symptoms don't improve, worsen or new problems develop. The patient verbalized understanding.   Follow-up: Return in about 3 months (around 03/16/2024).   Brandon LELON Servant, FNP-BC [facility]  [place], [place] [phone]   12/15/2023, 12:41 PM

## 2023-12-16 ENCOUNTER — Other Ambulatory Visit: Payer: Self-pay

## 2023-12-16 ENCOUNTER — Ambulatory Visit: Payer: Self-pay | Admitting: Nurse Practitioner

## 2023-12-16 DIAGNOSIS — E559 Vitamin D deficiency, unspecified: Secondary | ICD-10-CM

## 2023-12-16 MED ORDER — VITAMIN D (ERGOCALCIFEROL) 1.25 MG (50000 UNIT) PO CAPS: 50000.0000 [IU] | ORAL_CAPSULE | ORAL | 1 refills | Status: DC

## 2023-12-17 ENCOUNTER — Other Ambulatory Visit: Payer: Self-pay

## 2023-12-17 LAB — CBC WITH DIFFERENTIAL/PLATELET
Basophils Absolute: 0 10*3/uL (ref 0.0–0.2)
Basos: 1 %
EOS (ABSOLUTE): 0 10*3/uL (ref 0.0–0.4)
Eos: 0 %
Hematocrit: 33.5 % — ABNORMAL LOW (ref 37.5–51.0)
Hemoglobin: 10.1 g/dL — ABNORMAL LOW (ref 13.0–17.7)
Immature Grans (Abs): 0 10*3/uL (ref 0.0–0.1)
Immature Granulocytes: 0 %
Lymphocytes Absolute: 1.6 10*3/uL (ref 0.7–3.1)
Lymphs: 26 %
MCH: 27.8 pg (ref 26.6–33.0)
MCHC: 30.1 g/dL — ABNORMAL LOW (ref 31.5–35.7)
MCV: 92 fL (ref 79–97)
Monocytes Absolute: 0.5 10*3/uL (ref 0.1–0.9)
Monocytes: 9 %
Neutrophils Absolute: 4 10*3/uL (ref 1.4–7.0)
Neutrophils: 64 %
Platelets: 215 10*3/uL (ref 150–450)
RBC: 3.63 x10E6/uL — ABNORMAL LOW (ref 4.14–5.80)
RDW: 21.2 % — ABNORMAL HIGH (ref 11.6–15.4)
WBC: 6.2 10*3/uL (ref 3.4–10.8)

## 2023-12-17 LAB — ARTHRITIS PANEL
Anti Nuclear Antibody (ANA): NEGATIVE
Rheumatoid fact SerPl-aCnc: 14.2 [IU]/mL — ABNORMAL HIGH (ref <14.0)
Sed Rate: 67 mm/h — ABNORMAL HIGH (ref 0–30)
Uric Acid: 3.7 mg/dL — AB (ref 3.8–8.4)

## 2023-12-17 LAB — ACUTE HEP PANEL AND HEP B SURFACE AB
Hep A IgM: NEGATIVE
Hep B C IgM: NEGATIVE
Hepatitis B Surf Ab Quant: <3.5 m[IU]/mL — ABNORMAL LOW
Hepatitis B Surface Ag: NEGATIVE

## 2023-12-17 LAB — HCV INTERPRETATION

## 2023-12-17 LAB — PTH, INTACT AND CALCIUM
Calcium: 9 mg/dL (ref 8.7–10.2)
PTH: 50 pg/mL (ref 15–65)

## 2023-12-17 LAB — VITAMIN D 25 HYDROXY (VIT D DEFICIENCY, FRACTURES): Vit D, 25-Hydroxy: 16.6 ng/mL — ABNORMAL LOW (ref 30.0–100.0)

## 2023-12-17 LAB — HCV AB W REFLEX TO QUANT PCR: HCV Ab: NONREACTIVE

## 2023-12-20 ENCOUNTER — Other Ambulatory Visit: Payer: Self-pay

## 2023-12-21 ENCOUNTER — Emergency Department (HOSPITAL_COMMUNITY)
Admission: EM | Admit: 2023-12-21 | Discharge: 2023-12-21 | Disposition: A | Discharge status: Home / Self Care | Attending: Emergency Medicine | Admitting: Emergency Medicine

## 2023-12-21 ENCOUNTER — Emergency Department (HOSPITAL_COMMUNITY)

## 2023-12-21 ENCOUNTER — Encounter (HOSPITAL_COMMUNITY): Payer: Self-pay | Admitting: Emergency Medicine

## 2023-12-21 ENCOUNTER — Other Ambulatory Visit: Payer: Self-pay

## 2023-12-21 DIAGNOSIS — M79604 Pain in right leg: Secondary | ICD-10-CM | POA: Diagnosis present

## 2023-12-21 DIAGNOSIS — M1A9XX1 Chronic gout, unspecified, with tophus (tophi): Secondary | ICD-10-CM

## 2023-12-21 DIAGNOSIS — M79605 Pain in left leg: Secondary | ICD-10-CM | POA: Insufficient documentation

## 2023-12-21 DIAGNOSIS — D72829 Elevated white blood cell count, unspecified: Secondary | ICD-10-CM | POA: Diagnosis not present

## 2023-12-21 LAB — BASIC METABOLIC PANEL WITH GFR
Anion gap: 14 (ref 5–15)
BUN: 9 mg/dL (ref 6–20)
CO2: 18 mmol/L — ABNORMAL LOW (ref 22–32)
Calcium: 8.9 mg/dL (ref 8.9–10.3)
Chloride: 101 mmol/L (ref 98–111)
Creatinine, Ser: 0.87 mg/dL (ref 0.61–1.24)
GFR, Estimated: >60 mL/min (ref >60)
Glucose, Bld: 130 mg/dL — ABNORMAL HIGH (ref 70–99)
Potassium: 3.8 mmol/L (ref 3.5–5.1)
Sodium: 133 mmol/L — ABNORMAL LOW (ref 135–145)

## 2023-12-21 LAB — CBC
HCT: 30.3 % — ABNORMAL LOW (ref 39.0–52.0)
Hemoglobin: 9.5 g/dL — ABNORMAL LOW (ref 13.0–17.0)
MCH: 27.4 pg (ref 26.0–34.0)
MCHC: 31.4 g/dL (ref 30.0–36.0)
MCV: 87.3 fL (ref 80.0–100.0)
Platelets: 149 K/uL — ABNORMAL LOW (ref 150–400)
RBC: 3.47 MIL/uL — ABNORMAL LOW (ref 4.22–5.81)
RDW: 22.1 % — ABNORMAL HIGH (ref 11.5–15.5)
WBC: 12.8 K/uL — ABNORMAL HIGH (ref 4.0–10.5)
nRBC: 0.2 % (ref 0.0–0.2)

## 2023-12-21 LAB — RESP PANEL BY RT-PCR (RSV, FLU A&B, COVID)  RVPGX2
Influenza A by PCR: NEGATIVE
Influenza B by PCR: NEGATIVE
Resp Syncytial Virus by PCR: NEGATIVE
SARS Coronavirus 2 by RT PCR: NEGATIVE

## 2023-12-21 LAB — RETICULOCYTES
Immature Retic Fract: 29.8 % — ABNORMAL HIGH (ref 2.3–15.9)
RBC.: 3.52 MIL/uL — ABNORMAL LOW (ref 4.22–5.81)
Retic Count, Absolute: 117.6 K/uL (ref 19.0–186.0)
Retic Ct Pct: 3.3 % — ABNORMAL HIGH (ref 0.4–3.1)

## 2023-12-21 LAB — TROPONIN I (HIGH SENSITIVITY)
Troponin I (High Sensitivity): 8 ng/L (ref <18)
Troponin I (High Sensitivity): 7 ng/L (ref <18)

## 2023-12-21 LAB — SEDIMENTATION RATE: Sed Rate: 39 mm/h — ABNORMAL HIGH (ref 0–16)

## 2023-12-21 LAB — URIC ACID: Uric Acid, Serum: 5.5 mg/dL (ref 3.7–8.6)

## 2023-12-21 LAB — CK: Total CK: 158 U/L (ref 49–397)

## 2023-12-21 LAB — C-REACTIVE PROTEIN: CRP: 5.3 mg/dL — ABNORMAL HIGH (ref <1.0)

## 2023-12-21 MED ORDER — OXYCODONE HCL 5 MG PO TABS: 5.0000 mg | ORAL_TABLET | ORAL | 0 refills | Status: DC | PRN

## 2023-12-21 MED ORDER — PREDNISONE 10 MG (21) PO TBPK
ORAL_TABLET | Freq: Every day | ORAL | 0 refills | Status: DC
Start: 2023-12-21 — End: 2023-12-21

## 2023-12-21 MED ORDER — COLCHICINE 0.6 MG PO TABS
0.6000 mg | ORAL_TABLET | Freq: Two times a day (BID) | ORAL | 0 refills | Status: DC
Start: 2023-12-21 — End: 2024-01-12
  Filled 2023-12-21 – 2023-12-22 (×2): qty 10, 5d supply, fill #0

## 2023-12-21 MED ORDER — HYDROMORPHONE HCL 1 MG/ML IJ SOLN: 1.0000 mg | Freq: Once | INTRAMUSCULAR | Status: DC

## 2023-12-21 MED ORDER — MORPHINE SULFATE (PF) 4 MG/ML IV SOLN
4.0000 mg | Freq: Once | INTRAVENOUS | Status: AC
  Administered 2023-12-21: 4 mg via INTRAMUSCULAR

## 2023-12-21 MED ORDER — OXYCODONE HCL 5 MG PO TABS
5.0000 mg | ORAL_TABLET | ORAL | 0 refills | Status: DC | PRN
  Filled 2023-12-21: qty 5, 1d supply, fill #0

## 2023-12-21 MED ORDER — HYDROMORPHONE HCL 1 MG/ML IJ SOLN
0.5000 mg | Freq: Once | INTRAMUSCULAR | Status: AC
  Administered 2023-12-21: 0.5 mg via INTRAMUSCULAR
  Filled 2023-12-21: qty 1

## 2023-12-21 MED ORDER — PREDNISONE 20 MG PO TABS
60.0000 mg | ORAL_TABLET | Freq: Once | ORAL | Status: AC
  Administered 2023-12-21: 60 mg via ORAL
  Filled 2023-12-21: qty 3

## 2023-12-21 MED ORDER — MORPHINE SULFATE (PF) 4 MG/ML IV SOLN
4.0000 mg | Freq: Once | INTRAVENOUS | Status: DC
  Filled 2023-12-21: qty 1

## 2023-12-21 MED ORDER — PREDNISONE 10 MG PO TABS
ORAL_TABLET | ORAL | 0 refills | Status: AC
  Filled 2023-12-21: qty 42, 12d supply, fill #0

## 2023-12-21 MED ORDER — LIDOCAINE-EPINEPHRINE 1 %-1:100000 IJ SOLN
20.0000 mL | Freq: Once | INTRAMUSCULAR | Status: DC
  Filled 2023-12-21: qty 1

## 2023-12-21 MED ORDER — COLCHICINE 0.6 MG PO TABS: 0.6000 mg | ORAL_TABLET | Freq: Two times a day (BID) | ORAL | 0 refills | Status: DC

## 2023-12-21 MED ORDER — FENTANYL CITRATE PF 50 MCG/ML IJ SOSY
50.0000 ug | PREFILLED_SYRINGE | Freq: Once | INTRAMUSCULAR | Status: AC
  Administered 2023-12-21: 50 ug via INTRAVENOUS
  Filled 2023-12-21: qty 1

## 2023-12-21 MED ORDER — COLCHICINE 0.6 MG PO TABS
0.6000 mg | ORAL_TABLET | Freq: Once | ORAL | Status: AC
  Administered 2023-12-21: 0.6 mg via ORAL
  Filled 2023-12-21: qty 1

## 2023-12-21 MED ORDER — KETOROLAC TROMETHAMINE 15 MG/ML IJ SOLN
15.0000 mg | Freq: Once | INTRAMUSCULAR | Status: AC
  Administered 2023-12-21: 15 mg via INTRAMUSCULAR
  Filled 2023-12-21: qty 1

## 2023-12-21 NOTE — ED Triage Notes (Addendum)
 [facility]IB [facility] for neck pain, right hip pain, L., soles of feet. Knee cp associated with gout flare. He is on ibuprofen  for the same.  Pt also has hiccups.   150/80 100% HR 102

## 2023-12-21 NOTE — ED Notes (Signed)
 Attempted to ambulate pt. Used cane and could only stand and take about 3/4 steps. Pt stands to get in and out of wheelchair for restroom.
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
## 2023-12-21 NOTE — Discharge Instructions (Addendum)
 You were evaluated in the emergency room for leg pain.  Your lab work was without significant abnormality.  Prescription for prednisone  has been sent into your pharmacy.  Please be sure to complete the full course.  You may additionally use the oxycodone  as been sent in.  Please avoid drinking alcohol or driving as this can cause drowsiness.  You may additionally use Tylenol , ibuprofen  in addition to your Medications that you are already taking.  Please call the number on the sheet to follow-up with the rheumatologist.

## 2023-12-21 NOTE — ED Provider Notes (Signed)
 Signout received on this 53 year old male.  See previous note for full details.  Pending pain control and ambulation.  Low suspicion for septic joint.  Frequent history of gout. I spoke to patient and he states this is similar to his previous episodes of gout.  He is also having pain and swelling in multiple joints which would be not be consistent with septic joint.  Physical Exam  BP 132/72   Pulse 88   Temp 98.8 F (37.1 C)   Resp 17   SpO2 99%     Procedures  Procedures  ED Course / MDM   Clinical Course as of 12/21/23 1826  Tue Dec 21, 2023  1134 Patient evaluated in the ED for complaints of acute on chronic bilateral lower extremity pain primarily involving his ankles, soles and hips. He reportedly has been having these symptoms since 2022 and has been seen numerous occasions for similar presentations. He is requesting refills for his gout medications. 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  He is hemodynamically stable.  His exam is notable for bilateral generalized tenderness to his ankles and feet.  He has discomfort with range of motion with mild swelling involving left ankle and left knee with minimal increased warmth involving the left ankle.  He tolerates full range of motion of his hips and knees without discomfort and has no overlying erythema, warmth or swelling in these respective joints.  Workup initiated in triage is notable for mild leukocytosis and elevated CRP and sed rate.  His absolute reticular cell count is within normal limits as well as his uric acid.  Etiology at this time is unclear.  Does appear that he been worked up recently for similar complaints, workup was suggestive of RA.  Without any neuro injury or trauma do not feel that any additional imaging is indicated today.  Will treat symptomatically and provide referral for rheumatology. [JT]  1141 CBC(!) Mild leukocytosis, hemoglobin stable [JT]  1142  Reticulocytes(!) Absolute reticulocyte count within normal limits [JT]  1142 Basic metabolic panel(!) Without significant abnormality [JT]  1142 C-reactive protein(!) Mildly elevated to 5.3 [JT]  1142 CK Within normal limits [JT]  1142 Uric acid Without elevation [JT]  1142 Sedimentation rate(!) Mildly elevated at 39 [JT]  1142 Troponin I (High Sensitivity) Within normal limits [JT]  1142 Resp panel by RT-PCR (RSV, Flu A&B, Covid) Anterior Nasal Swab Negative [JT]  1321 Attempted to ambulate, however patient did not tolerate.  Will obtain imaging [JT]  1419 DG Knee 2 Views Left Small left knee effusion [JT]  1419 DG Knee 2 Views Right No acute abnormality [JT]  1419 DG Ankle Complete Left Moderate left ankle effusion, no acute abnormality [JT]  1419 DG Ankle Complete Right No acute abnormality right ankle [JT]  1421 DG Hip Unilat W or Wo Pelvis 2-3 Views Right Minimal degenerative changes of right knee [JT]  1521 At this time most consistent with gout.  He has a longstanding history of gout to the affected extremities.  Administered a dose of colchicine .  Will reassess ambulation. [JT]    Clinical Course User Index [JT] Donnajean Lynwood DEL, PA-C   Medical Decision Making Amount and/or Complexity of Data Reviewed Labs: ordered. Decision-making details documented in ED Course. Radiology: ordered. Decision-making details documented in ED Course.  Risk Prescription drug management.   Shared decision making had with patient and given polyarthralgia he agrees to defer arthrocentesis.  Agrees with pain control and  discharge. Discharged in stable condition. Discussed with attending as well.  Who feels given polyarthralgias is less likely septic joint.        Hildegard Loge, PA-C 12/21/23 PERVIS    Jerrol Agent, MD 12/22/23 [phone]

## 2023-12-21 NOTE — ED Notes (Signed)
 Assisted pt in taking off his long-sleeve shirt to start an IV. Bilateral elbows swollen and painful. Provider notified.

## 2023-12-21 NOTE — ED Notes (Signed)
 Attempted to assist pt to ambulate. He was unable to stand and put all his weight on his feet. He attempted 3x before saying no. Provider notified.

## 2023-12-21 NOTE — ED Provider Triage Note (Signed)
 Emergency Medicine Provider Triage Evaluation Note  Brandon Arias , a 53 y.o. male  was evaluated in triage.  Pt complains of generalized pain in the soles of his feet, both hips and chest.  He does state that he has been coughing and pain is worse in his chest when he coughs.  Appears that he was here couple weeks ago and worked up for gout.  Patient additionally reports history of sickle cell however I do not find any on chart review.  Review of Systems  Positive:  Negative:   Physical Exam  BP (!) 125/100   Pulse 83   Temp 98.3 F (36.8 C) (Oral)   Resp 18   SpO2 98%  Gen:   Awake, no distress   Resp:  Normal effort  MSK:   Diffuse tenderness involving lower extremities and hips, no focality, DP/PT pulses 2+ Other:    Medical Decision Making  Medically screening exam initiated at 8:12 AM.  Appropriate orders placed.  [facility] was informed that the remainder of the evaluation will be completed by another provider, this initial triage assessment does not replace that evaluation, and the importance of remaining in the ED until their evaluation is complete.     Donnajean Lynwood DEL, PA-C 12/21/23 [phone]

## 2023-12-22 ENCOUNTER — Other Ambulatory Visit: Payer: Self-pay

## 2023-12-25 ENCOUNTER — Encounter (HOSPITAL_COMMUNITY): Payer: Self-pay

## 2023-12-25 ENCOUNTER — Other Ambulatory Visit: Payer: Self-pay

## 2023-12-25 ENCOUNTER — Emergency Department (HOSPITAL_COMMUNITY)
Admission: EM | Admit: 2023-12-25 | Discharge: 2023-12-25 | Disposition: A | Discharge status: Home / Self Care | Attending: Emergency Medicine | Admitting: Emergency Medicine

## 2023-12-25 DIAGNOSIS — T162XXA Foreign body in left ear, initial encounter: Secondary | ICD-10-CM | POA: Insufficient documentation

## 2023-12-25 DIAGNOSIS — W44F9XA Other object of natural or organic material, entering into or through a natural orifice, initial encounter: Secondary | ICD-10-CM | POA: Insufficient documentation

## 2023-12-25 NOTE — ED Notes (Signed)
 Cotton visualized in L ear distal EAC. Removed with forceps w/o difficulty. TM clear/ unremarkable.

## 2023-12-25 NOTE — Discharge Instructions (Signed)
 Please follow-up with your primary care doctor.

## 2023-12-25 NOTE — ED Notes (Signed)
ED PA at BS 

## 2023-12-25 NOTE — ED Provider Notes (Signed)
 [place] [facility] Provider Note   CSN: [id number] Arrival date & time: 12/25/23  1316     Patient presents with: Foreign Body in Ear   Brandon Arias is a 53 y.o. male with overall noncontributory past medical history presents concern for a piece of cotton stuck in his left ear.  Piece of cotton was removed by nursing staff prior to provider evaluation.  Patient with no other acute complaints today.    Foreign Body in Ear       Prior to Admission medications   Medication Sig Start Date End Date Taking? Authorizing Provider  allopurinol  (ZYLOPRIM ) 300 MG tablet Take 1 tablet (300 mg total) by mouth daily. prendre 1 comprime par voie orale par jour pour [employer]. 12/15/23   Fleming, Zelda W, NP  amLODipine  (NORVASC ) 10 MG tablet Take 1 tablet (10 mg total) by mouth daily. prendre 1 comprime par voie orale par jour pour la tension arterielle 12/15/23   Fleming, Zelda W, NP  colchicine  0.6 MG tablet Take 1 tablet (0.6 mg total) by mouth 2 (two) times daily. 12/21/23   Hildegard Loge, PA-C  ferrous sulfate  325 (65 FE) MG tablet Take 1 tablet (325 mg total) by mouth daily. Patient not taking: Reported on 12/15/2023 07/10/22   Freddi Hamilton, MD  indomethacin  (INDOCIN  SR) 75 MG CR capsule Take 1 capsule (75 mg total) by mouth 2 (two) times daily with a meal. prendre 1 comprime par voie orale par jour pour [employer]. 12/15/23   Fleming, Zelda W, NP  methocarbamol  (ROBAXIN ) 500 MG tablet Take 1 tablet (500 mg total) by mouth 2 (two) times daily. 12/05/23   Yolande Lamar BROCKS, MD  oxyCODONE  (ROXICODONE ) 5 MG immediate release tablet Take 1 tablet (5 mg total) by mouth every 4 (four) hours as needed for severe pain (pain score 7-10). 12/21/23   Hildegard, Amjad, PA-C  predniSONE  (DELTASONE ) 10 MG tablet Take 6 tablets (60 mg total) by mouth daily for 2 days, THEN 5 tablets (50 mg total) daily for 2 days, THEN 4 tablets (40 mg total) daily for 2 days, THEN 3 tablets (30 mg  total) daily for 2 days, THEN 2 tablets (20 mg total) daily for 2 days, THEN 1 tablet (10 mg total) daily for 2 days. 12/21/23 01/03/24  Hildegard Loge, PA-C  valsartan  (DIOVAN ) 160 MG tablet Take 1 tablet (160 mg total) by mouth daily. prendre 1 comprime par voie orale par jour pour l hypertension arterielle 12/15/23   Fleming, Zelda W, NP  Vitamin D , Ergocalciferol , (DRISDOL ) 1.25 MG (50000 UNIT) CAPS capsule Take 1 capsule (50,000 Units total) by mouth every 7 (seven) days. 12/16/23   Fleming, Zelda W, NP    Allergies: Patient has no known allergies.    Review of Systems  All other systems reviewed and are negative.   Updated Vital Signs BP (!) 117/94 (BP Location: Right Arm)   Pulse 67   Temp 98 F (36.7 C)   Resp 17   Ht 5' 7 (1.702 m)   Wt 73 kg   SpO2 100%   BMI 25.21 kg/m   Physical Exam Vitals and nursing note reviewed.  Constitutional:      General: He is not in acute distress.    Appearance: Normal appearance.  HENT:     Head: Normocephalic and atraumatic.     Right Ear: Tympanic membrane normal.     Left Ear: Tympanic membrane normal.  Eyes:  General:        Right eye: No discharge.        Left eye: No discharge.  Cardiovascular:     Rate and Rhythm: Normal rate and regular rhythm.  Pulmonary:     Effort: Pulmonary effort is normal. No respiratory distress.  Musculoskeletal:        General: No deformity.  Skin:    General: Skin is warm and dry.  Neurological:     Mental Status: He is alert and oriented to person, place, and time.  Psychiatric:        Mood and Affect: Mood normal.        Behavior: Behavior normal.     (all labs ordered are listed, but only abnormal results are displayed) Labs Reviewed - No data to display  EKG: None  Radiology: No results found.   Procedures   Medications Ordered in the ED - No data to display                                  Medical Decision Making  Overall well-appearing 53 year old male presents with  piece of cotton stuck in his left ear.  Emergent differential diagnosis includes foreign body, associated infection, versus other, overall low clinical concern for infection, cotton was easily visualized by nursing staff and removed.  I have verified total removal of cotton with otoscope, patient reports some mild pain.  No evidence of effusion, redness, or associated infection, stable for discharge at this time.  Final diagnoses:  Foreign body of left ear, initial encounter    ED Discharge Orders     None          Rosan Sherlean VEAR DEVONNA 12/25/23 1404    Yolande Lamar BROCKS, MD 12/28/23 [phone]

## 2023-12-25 NOTE — ED Notes (Signed)
 Pt alert, NAD, calm, interactive, resps e/u, speaking clearly. Steady gait.

## 2023-12-25 NOTE — ED Triage Notes (Signed)
 PT has a piece of cotton stuck in his L ear.

## 2023-12-29 ENCOUNTER — Encounter: Payer: Self-pay | Admitting: Radiology

## 2024-01-10 NOTE — Progress Notes (Deleted)
 Office Visit Note  Patient: Brandon Arias             Date of Birth: Jun 12, 1970           MRN: [id number]             PCP: Theotis Haze ORN, NP Referring: Theotis Haze ORN, NP Visit Date: 01/21/2024 Occupation: @GUAROCC @  Subjective:  No chief complaint on file.    History of Present Illness: Brandon Arias is a 53 y.o. male ***   Activities of Daily Living:  Patient reports morning stiffness for *** {minute/hour:19697}.   Patient {ACTIONS;DENIES/REPORTS:21021675::Denies} nocturnal pain.  Difficulty dressing/grooming: {ACTIONS;DENIES/REPORTS:21021675::Denies} Difficulty climbing stairs: {ACTIONS;DENIES/REPORTS:21021675::Denies} Difficulty getting out of chair: {ACTIONS;DENIES/REPORTS:21021675::Denies} Difficulty using hands for taps, buttons, cutlery, and/or writing: {ACTIONS;DENIES/REPORTS:21021675::Denies}  No Rheumatology ROS completed.    Rheum History: # Diagnosed in ***.  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 Past Surgical History:  Procedure Laterality Date   no known surgeries     Social History   Social History Narrative   Not on file   Immunization History  Administered Date(s) Administered   Influenza,inj,Quad PF,6+ Mos 05/23/2019, 03/17/2022   Tdap 09/23/2020     Objective: Vital Signs: There were no vitals taken for this visit.   Physical Exam Vitals and nursing note reviewed.   HENT:     Head: Normocephalic and atraumatic.     Nose: Nose normal.  Eyes:     Conjunctiva/sclera: Conjunctivae normal.     Pupils: Pupils are equal, round, and reactive to light.  Cardiovascular:     Rate and Rhythm: Normal rate and regular rhythm.     Heart sounds: Normal heart sounds.  Pulmonary:     Effort: Pulmonary effort is normal.     Breath sounds: Normal breath sounds.  Skin:    General: Skin is warm and dry.  Neurological:     Mental Status: He is alert. Mental status is at baseline.  Psychiatric:        Mood and Affect: Mood normal.        Behavior: Behavior normal.      Musculoskeletal Exam: ***  CDAI Exam: CDAI Score: -- Patient Global: --; Provider Global: -- Swollen: --; Tender: -- Joint Exam 01/21/2024   No joint exam has been documented for this visit   There is currently no information documented on the homunculus. Go to the Rheumatology activity and complete the homunculus joint exam.  Investigation: No additional findings.  Imaging: DG Hip Unilat W or Wo Pelvis 2-3 Views Right Result Date: 12/21/2023 CLINICAL DATA:  RIGHT hip pain EXAM: DG HIP (WITH OR WITHOUT PELVIS) 2-3V RIGHT COMPARISON:  None available FINDINGS: No fracture, dislocation, or soft tissue abnormality. Minimal degenerative changes of the RIGHT hip. IMPRESSION: Minimal degenerative changes of the RIGHT hip. Electronically Signed   By: Aliene Lloyd M.D.   On: 12/21/2023 14:17   DG Ankle Complete Right Result Date: 12/21/2023  CLINICAL DATA:  Pain EXAM: RIGHT ANKLE - COMPLETE 3+ VIEW COMPARISON:  None available FINDINGS: No acute osseous abnormality. Moderate spurring noted at the dorsal aspect of the talonavicular joint. Visualized soft tissues are unremarkable. IMPRESSION: No acute abnormality of the RIGHT ankle. Electronically Signed   By: Aliene Lloyd M.D.   On: 12/21/2023 14:15   DG Ankle Complete Left Result Date: 12/21/2023 CLINICAL DATA:  Pain EXAM: LEFT ANKLE COMPLETE - 3+  VIEW COMPARISON:  None available FINDINGS: No acute osseous abnormality. Small lucency seen in the medial malleolus is most likely a degenerative geode. Moderate ankle joint effusion is noted. IMPRESSION: 1. No acute osseous abnormality of the LEFT ankle. 2. Moderate ankle joint effusion. Electronically Signed   By: Aliene Lloyd M.D.   On: 12/21/2023 14:14   DG Knee 2 Views Right Result Date: 12/21/2023 CLINICAL DATA:  Pain EXAM: RIGHT KNEE - 1-2 VIEW COMPARISON:  None available FINDINGS: No acute osseous abnormality. Soft tissues are normal. Joint spaces are maintained. IMPRESSION: Normal radiographic appearance of the RIGHT knee. Electronically Signed   By: Aliene Lloyd M.D.   On: 12/21/2023 14:12   DG Knee 2 Views Left Result Date: 12/21/2023 CLINICAL DATA:  Pain EXAM: LEFT KNEE - 1-2 VIEW COMPARISON:  03/06/2022 FINDINGS: No acute osseous abnormality. Minimal spurring of the patellofemoral compartment. Small knee joint effusion. IMPRESSION: Small LEFT knee joint effusion. Electronically Signed   By: Aliene Lloyd M.D.   On: 12/21/2023 14:11   DG Chest 2 View Result Date: 12/21/2023 CLINICAL DATA:  Chest pain. EXAM: CHEST - 2 VIEW COMPARISON:  December 04, 2023. FINDINGS: The heart size and mediastinal contours are within normal limits. Both lungs are clear. The visualized skeletal structures are unremarkable. IMPRESSION: No active cardiopulmonary disease. Electronically Signed   By: Lynwood Landy Raddle M.D.   On: 12/21/2023 08:13    Recent Labs: Lab Results  Component Value Date   WBC 12.8 (H) 12/21/2023   HGB 9.5 (L) 12/21/2023   PLT 149 (L) 12/21/2023   NA 133 (L) 12/21/2023   K 3.8 12/21/2023   CL 101 12/21/2023   CO2 18 (L) 12/21/2023   GLUCOSE 130 (H) 12/21/2023   BUN 9 12/21/2023   CREATININE 0.87 12/21/2023   BILITOT 0.9 12/04/2023   ALKPHOS 152 (H) 12/04/2023   AST 55 (H) 12/04/2023   ALT 30 12/04/2023   PROT 7.7 12/04/2023   ALBUMIN 2.9 (L) 12/04/2023   CALCIUM 8.9 12/21/2023    GFRAA 123 09/28/2019   QFTBGOLDPLUS Negative 08/14/2021   Lab Results  Component Value Date   ANA Negative 12/15/2023   RF 14.2 (H) 12/15/2023    Speciality Comments: No specialty comments available.  Procedures:  No procedures performed Allergies: Patient has no known allergies.   Assessment / Plan:     Visit Diagnoses: No diagnosis found.  #High risk medication use  Orders: No orders of the defined types were placed in this encounter.  No orders of the defined types were placed in this encounter.   I personally spent a total of *** minutes in the care of the patient today including {Time Based Coding:210964241}.  Follow-Up Instructions: No follow-ups on file.   Asberry Claw, DO

## 2024-01-12 ENCOUNTER — Other Ambulatory Visit (HOSPITAL_COMMUNITY): Payer: Self-pay

## 2024-01-12 ENCOUNTER — Emergency Department (HOSPITAL_COMMUNITY): Admission: EM | Admit: 2024-01-12 | Discharge: 2024-01-12 | Disposition: A | Discharge status: Home / Self Care

## 2024-01-12 ENCOUNTER — Other Ambulatory Visit: Payer: Self-pay

## 2024-01-12 DIAGNOSIS — I509 Heart failure, unspecified: Secondary | ICD-10-CM | POA: Diagnosis not present

## 2024-01-12 DIAGNOSIS — M25562 Pain in left knee: Secondary | ICD-10-CM | POA: Diagnosis not present

## 2024-01-12 DIAGNOSIS — M2550 Pain in unspecified joint: Secondary | ICD-10-CM

## 2024-01-12 DIAGNOSIS — M79672 Pain in left foot: Secondary | ICD-10-CM | POA: Diagnosis present

## 2024-01-12 DIAGNOSIS — M25572 Pain in left ankle and joints of left foot: Secondary | ICD-10-CM | POA: Insufficient documentation

## 2024-01-12 DIAGNOSIS — M25571 Pain in right ankle and joints of right foot: Secondary | ICD-10-CM | POA: Insufficient documentation

## 2024-01-12 MED ORDER — PREDNISONE 10 MG PO TABS
20.0000 mg | ORAL_TABLET | Freq: Every day | ORAL | 0 refills | Status: DC
  Filled 2024-01-12: qty 15, 7d supply, fill #0

## 2024-01-12 MED ORDER — COLCHICINE 0.6 MG PO TABS
0.6000 mg | ORAL_TABLET | Freq: Two times a day (BID) | ORAL | 0 refills | Status: DC
  Filled 2024-01-12: qty 10, 5d supply, fill #0

## 2024-01-12 NOTE — Discharge Instructions (Signed)
 Consultez le Dr Theotis. Appelez le cabinet ds aujourd'hui pour prendre rendez-vous.   Follow up with Dr. Theotis. Call the office today to schedule an appointment.

## 2024-01-12 NOTE — ED Triage Notes (Signed)
 Pt has pain and swelling bilaterally in feet. Pt speaks french, but when asked he does say he has history of CHF, but not present in the chart.

## 2024-01-12 NOTE — ED Triage Notes (Signed)
 Patient with hx of gout here for eval of ongoing joint pain in knees, elbows, and feet. Also endorses bilateral foot swelling but no swelling in the legs is appreciated. Taking prednisone , allopurinol , and colchicine  as prescribed recently.

## 2024-01-12 NOTE — ED Provider Notes (Signed)
 [place] [facility] AT [facility] Provider Note   CSN: [id number] Arrival date & time: 01/12/24  1138     Patient presents with: Foot Pain   Brandon Arias is a 53 y.o. male.   53 yo male presents with complaint of pain in the left knee and bilateral feet. Onset last night, similar to prior gout episode, out of medications but not sure which ones. No falls, no injuries.   Language interpreter used: [place] [phone].  Foot Pain       Prior to Admission medications   Medication Sig Start Date End Date Taking? Authorizing Provider  predniSONE  (DELTASONE ) 10 MG tablet Take 2 tablets (20 mg total) by mouth daily. 01/12/24  Yes Beverley Leita LABOR, PA-C  allopurinol  (ZYLOPRIM ) 300 MG tablet Take 1 tablet (300 mg total) by mouth daily. prendre 1 comprime par voie orale par jour pour [employer]. 12/15/23   Fleming, Zelda W, NP  amLODipine  (NORVASC ) 10 MG tablet Take 1 tablet (10 mg total) by mouth daily. prendre 1 comprime par voie orale par jour pour la tension arterielle 12/15/23   Fleming, Zelda W, NP  colchicine  0.6 MG tablet Take 1 tablet (0.6 mg total) by mouth 2 (two) times daily. 01/12/24   Beverley Leita LABOR, PA-C  ferrous sulfate  325 (65 FE) MG tablet Take 1 tablet (325 mg total) by mouth daily. Patient not taking: Reported on 12/15/2023 07/10/22   Freddi Hamilton, MD  indomethacin  (INDOCIN  SR) 75 MG CR capsule Take 1 capsule (75 mg total) by mouth 2 (two) times daily with a meal. prendre 1 comprime par voie orale par jour pour [employer]. 12/15/23   Fleming, Zelda W, NP  methocarbamol  (ROBAXIN ) 500 MG tablet Take 1 tablet (500 mg total) by mouth 2 (two) times daily. 12/05/23   Yolande Lamar BROCKS, MD  oxyCODONE  (ROXICODONE ) 5 MG immediate release tablet Take 1 tablet (5 mg total) by mouth every 4 (four) hours as needed for severe pain (pain score 7-10). 12/21/23   Hildegard Loge, PA-C  valsartan  (DIOVAN ) 160 MG tablet Take 1 tablet (160 mg total) by mouth daily. prendre 1  comprime par voie orale par jour pour l hypertension arterielle 12/15/23   Fleming, Zelda W, NP  Vitamin D , Ergocalciferol , (DRISDOL ) 1.25 MG (50000 UNIT) CAPS capsule Take 1 capsule (50,000 Units total) by mouth every 7 (seven) days. 12/16/23   Fleming, Zelda W, NP    Allergies: Patient has no known allergies.    Review of Systems Negative except as per HPI Updated Vital Signs BP (!) 151/87 (BP Location: Left Arm)   Pulse 70   Temp 98.7 F (37.1 C)   Resp 16   SpO2 100%   Physical Exam Vitals and nursing note reviewed.  Constitutional:      General: He is not in acute distress.    Appearance: He is well-developed. He is not diaphoretic.  HENT:     Head: Normocephalic and atraumatic.  Cardiovascular:     Pulses: Normal pulses.  Pulmonary:     Effort: Pulmonary effort is normal.  Musculoskeletal:        General: Swelling and tenderness present. No deformity.       Legs:       Feet:  Skin:    General: Skin is warm and dry.     Findings: No erythema or rash.  Neurological:     Mental Status: He is alert and oriented to person, place, and time.  Psychiatric:  Behavior: Behavior normal.     (all labs ordered are listed, but only abnormal results are displayed) Labs Reviewed - No data to display  EKG: None  Radiology: No results found.   Procedures   Medications Ordered in the ED - No data to display                                  Medical Decision Making Risk Prescription drug management.   53 yo male with complaint of pain in the plantar surface of the feet and left knee. No significant swelling, no erythema. Is able to fully extend and flex the left knee, do not suspect septic joint.  Colchicine  and prednisone  provided. Sees Dr. Brigida, reiterated through the interpreter to call and follow up with PCP.       Final diagnoses:  Polyarthralgia    ED Discharge Orders          Ordered    colchicine  0.6 MG tablet  2 times daily         01/12/24 1207    predniSONE  (DELTASONE ) 10 MG tablet  Daily        01/12/24 1207               Beverley Leita LABOR, PA-C 01/12/24 1228    Ula Prentice SAUNDERS, MD 01/12/24 1620

## 2024-01-21 ENCOUNTER — Encounter

## 2024-01-27 ENCOUNTER — Ambulatory Visit: Admitting: Orthopedic Surgery

## 2024-03-07 ENCOUNTER — Ambulatory Visit: Admitting: Family Medicine
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
## 2024-03-15 ENCOUNTER — Telehealth: Payer: Self-pay | Admitting: Nurse Practitioner

## 2024-03-15 NOTE — Telephone Encounter (Signed)
 Pt confirmed appt (per vr)

## 2024-03-17 ENCOUNTER — Ambulatory Visit: Attending: Nurse Practitioner | Admitting: Nurse Practitioner

## 2024-03-17 ENCOUNTER — Telehealth: Payer: Self-pay

## 2024-03-17 ENCOUNTER — Other Ambulatory Visit: Payer: Self-pay

## 2024-03-17 ENCOUNTER — Encounter: Payer: Self-pay | Admitting: Nurse Practitioner

## 2024-03-17 VITALS — BP 125/72 | HR 94 | Resp 19 | Ht 67.0 in | Wt 167.0 lb

## 2024-03-17 DIAGNOSIS — Z23 Encounter for immunization: Secondary | ICD-10-CM

## 2024-03-17 DIAGNOSIS — Z7182 Exercise counseling: Secondary | ICD-10-CM

## 2024-03-17 DIAGNOSIS — I10 Essential (primary) hypertension: Secondary | ICD-10-CM | POA: Diagnosis not present

## 2024-03-17 DIAGNOSIS — F419 Anxiety disorder, unspecified: Secondary | ICD-10-CM

## 2024-03-17 DIAGNOSIS — M1A9XX1 Chronic gout, unspecified, with tophus (tophi): Secondary | ICD-10-CM | POA: Diagnosis not present

## 2024-03-17 DIAGNOSIS — D649 Anemia, unspecified: Secondary | ICD-10-CM | POA: Diagnosis not present

## 2024-03-17 DIAGNOSIS — Z713 Dietary counseling and surveillance: Secondary | ICD-10-CM | POA: Diagnosis not present

## 2024-03-17 DIAGNOSIS — E559 Vitamin D deficiency, unspecified: Secondary | ICD-10-CM | POA: Diagnosis not present

## 2024-03-17 DIAGNOSIS — F32A Depression, unspecified: Secondary | ICD-10-CM | POA: Diagnosis not present

## 2024-03-17 MED ORDER — INDOMETHACIN ER 75 MG PO CPCR
75.0000 mg | ORAL_CAPSULE | Freq: Two times a day (BID) | ORAL | 1 refills | Status: AC
  Filled 2024-03-17: qty 60, 30d supply, fill #0

## 2024-03-17 MED ORDER — VITAMIN D (ERGOCALCIFEROL) 1.25 MG (50000 UNIT) PO CAPS
50000.0000 [IU] | ORAL_CAPSULE | ORAL | 1 refills | Status: AC
  Filled 2024-03-17: qty 12, 84d supply, fill #0

## 2024-03-17 MED ORDER — VALSARTAN 160 MG PO TABS
160.0000 mg | ORAL_TABLET | Freq: Every day | ORAL | 1 refills | Status: AC
  Filled 2024-03-17: qty 90, 90d supply, fill #0

## 2024-03-17 MED ORDER — AMLODIPINE BESYLATE 10 MG PO TABS
10.0000 mg | ORAL_TABLET | Freq: Every day | ORAL | 1 refills | Status: AC
  Filled 2024-03-17: qty 90, 90d supply, fill #0

## 2024-03-17 NOTE — Telephone Encounter (Signed)
 Pharmacy Patient Advocate Encounter  Received notification from [facility] MEDICAID that Prior Authorization for INDOMETHACIN  ER has been APPROVED from 03/17/2024 to 03/17/2025   PA #/Case ID/Reference #: [id number]

## 2024-03-17 NOTE — Patient Instructions (Addendum)
 [facility] [address] [place],  [place]  [address] Main: [phone]  [facility] Located in: Donna MANO [facility] [address] Address: [address], [place], [place] [address] Phone: [phone]

## 2024-03-17 NOTE — Progress Notes (Unsigned)
 Assessment & Plan:  Kanin was seen today for hypertension.  Diagnoses and all orders for this visit:  Primary hypertension -     amLODipine  (NORVASC ) 10 MG tablet; Take 1 tablet (10 mg total) by mouth daily. prendre 1 comprime par voie orale par jour pour la tension arterielle -     valsartan  (DIOVAN ) 160 MG tablet; Take 1 tablet (160 mg total) by mouth daily. prendre 1 comprime par voie orale par jour pour l hypertension arterielle -     CMP14+EGFR Continue all antihypertensives as prescribed.  Reminded to bring in blood pressure log for follow  up appointment.  RECOMMENDATIONS: DASH/Mediterranean Diets are healthier choices for HTN.    Need for influenza vaccination -     Flu vaccine trivalent PF, 6mos and older(Flulaval,Afluria,Fluarix,Fluzone)  Gout, tophaceous Comments: 07/30/21: Uric acid 7.9, ESR 41, CRP 1.4. Allopurinol  200 mg daily.  Orders: -     indomethacin  (INDOCIN  SR) 75 MG CR capsule; Take 1 capsule (75 mg total) by mouth 2 (two) times daily with a meal. prendre 1 comprime par voie orale par jour pour [employer]. 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 - Provided letter for social security administration supporting disability claim. Referral made to rheumatology and patient was made aware of day, location and time.    Vitamin D  deficiency -     Vitamin D , Ergocalciferol , (DRISDOL ) 1.25 MG (50000 UNIT) CAPS capsule; Take 1 capsule (50,000 Units total) by mouth every 7 (seven) days. prendre un comprim par semaine en cas de carence en vitamine D  Anemia, unspecified type -     CBC with Differential Due for colonoscopy. Previous scheduling issues due to communication barriers. - Scheduled colonoscopy appointment.  Anxiety and depression -     Ambulatory referral to Behavioral Health Interest in counseling for mental health support due to stressors. Previous access issues due to financial and language barriers. -  Referred to behavioral health for counseling services. - Provided walk-in schedule and address for services.   Patient has been counseled on age-appropriate routine health concerns for screening and prevention. These are reviewed and up-to-date. Referrals have been placed accordingly. Immunizations are up-to-date or declined.    Subjective:   Chief Complaint  Patient presents with   Hypertension    Brandon Arias 53 y.o. male presents to office today for follow up to HTN and tophaceous gout   VRI was used to communicate directly with patient for the entire encounter including providing detailed patient instructions.    He has a past medical history of Arthritis, Gout (12/30/2017), and Hypertension.    He has been unable to work for almost three years due to gout, which prevents him from lifting objects.  He is seeking a letter to present to the Social Security Administration to support his application for disability benefits. A letter was previously placed in his MyChart on August 1st, but he has not been able to access it due to being homeless and lacking internet access.  He missed an appointment with a rheumatologist on September 26th due to language barriers and issues with his phone line being disconnected. He does not speak English and has difficulty reading it and does not have mychart, which complicates his ability to receive and respond to appointment notifications. He also lacks transportation, often walking or using public transport when he can afford it.  He is a refugee who has experienced significant personal loss, including the death of his 79 year old son, which he  attributes to his difficult living conditions. He expresses emotional distress when discussing his situation and is open to speaking with someone about his mental health struggles.  HTN Blood pressure at goal.  He is prescribed amlodipine  and valsartan  for hypertension  BP Readings from Last 3 Encounters:   03/17/24 125/72  03/07/24 (!) 147/79  01/12/24 (!) 151/87     Review of Systems  Constitutional:  Negative for fever, malaise/fatigue and weight loss.  HENT: Negative.  Negative for nosebleeds.   Eyes: Negative.  Negative for blurred vision, double vision and photophobia.  Respiratory: Negative.  Negative for cough and shortness of breath.   Cardiovascular: Negative.  Negative for chest pain, palpitations and leg swelling.  Gastrointestinal: Negative.  Negative for heartburn, nausea and vomiting.  Musculoskeletal:  Positive for joint pain. Negative for myalgias.  Neurological: Negative.  Negative for dizziness, focal weakness, seizures and headaches.  Psychiatric/Behavioral:  Positive for depression. Negative for suicidal ideas.     Past Medical History:  Diagnosis Date   Arthritis    Gout 12/30/2017   Hypertension     Past Surgical History:  Procedure Laterality Date   no known surgeries      History reviewed. No pertinent family history.  Social History Reviewed with no changes to be made today.   Outpatient Medications Prior to Visit  Medication Sig Dispense Refill   amLODipine  (NORVASC ) 10 MG tablet Take 1 tablet (10 mg total) by mouth daily. prendre 1 comprime par voie orale par jour pour la tension arterielle 90 tablet 1   valsartan  (DIOVAN ) 160 MG tablet Take 1 tablet (160 mg total) by mouth daily. prendre 1 comprime par voie orale par jour pour l hypertension arterielle 90 tablet 1   allopurinol  (ZYLOPRIM ) 300 MG tablet Take 1 tablet (300 mg total) by mouth daily. prendre 1 comprime par voie orale par jour pour [employer]. (Patient not taking: Reported on 03/17/2024) 90 tablet 1   colchicine  0.6 MG tablet Take 1 tablet (0.6 mg total) by mouth 2 (two) times daily. (Patient not taking: Reported on 03/17/2024) 10 tablet 0   ferrous sulfate  325 (65 FE) MG tablet Take 1 tablet (325 mg total) by mouth daily. (Patient not taking: Reported on 03/17/2024) 30 tablet 0    indomethacin  (INDOCIN  SR) 75 MG CR capsule Take 1 capsule (75 mg total) by mouth 2 (two) times daily with a meal. prendre 1 comprime par voie orale par jour pour [employer]. (Patient not taking: Reported on 03/17/2024) 60 capsule 1   methocarbamol  (ROBAXIN ) 500 MG tablet Take 1 tablet (500 mg total) by mouth 2 (two) times daily. (Patient not taking: Reported on 03/17/2024) 10 tablet 0   oxyCODONE  (ROXICODONE ) 5 MG immediate release tablet Take 1 tablet (5 mg total) by mouth every 4 (four) hours as needed for severe pain (pain score 7-10). (Patient not taking: Reported on 03/17/2024) 5 tablet 0   predniSONE  (DELTASONE ) 10 MG tablet Take 2 tablets (20 mg total) by mouth daily. (Patient not taking: Reported on 03/17/2024) 15 tablet 0   Vitamin D , Ergocalciferol , (DRISDOL ) 1.25 MG (50000 UNIT) CAPS capsule Take 1 capsule (50,000 Units total) by mouth every 7 (seven) days. (Patient not taking: Reported on 03/17/2024) 12 capsule 1   No facility-administered medications prior to visit.    No Known Allergies     Objective:    BP 125/72 (BP Location: Left Arm, Patient Position: Sitting, Cuff Size: Normal)   Pulse 94   Resp 19   Ht  5' 7 (1.702 m)   Wt 167 lb (75.8 kg)   SpO2 98%   BMI 26.16 kg/m  Wt Readings from Last 3 Encounters:  03/17/24 167 lb (75.8 kg)  03/07/24 162 lb 12.8 oz (73.8 kg)  12/25/23 160 lb 15 oz (73 kg)    Physical Exam Vitals and nursing note reviewed.  Constitutional:      Appearance: He is well-developed.  HENT:     Head: Normocephalic and atraumatic.  Cardiovascular:     Rate and Rhythm: Normal rate and regular rhythm.     Heart sounds: Normal heart sounds. No murmur heard.    No friction rub. No gallop.  Pulmonary:     Effort: Pulmonary effort is normal. No tachypnea or respiratory distress.     Breath sounds: Normal breath sounds. No decreased breath sounds, wheezing, rhonchi or rales.  Chest:     Chest wall: No tenderness.  Musculoskeletal:        General:  Normal range of motion.     Right elbow: Deformity present. Tenderness present in radial head and olecranon process.     Left elbow: Deformity present. Tenderness present in radial head and olecranon process.     Cervical back: Normal range of motion.  Skin:    General: Skin is warm and dry.  Neurological:     Mental Status: He is alert and oriented to person, place, and time.     Coordination: Coordination normal.  Psychiatric:        Behavior: Behavior normal. Behavior is cooperative.        Thought Content: Thought content normal.        Judgment: Judgment normal.          Patient has been counseled extensively about nutrition and exercise as well as the importance of adherence with medications and regular follow-up. The patient was given clear instructions to go to ER or return to medical center if symptoms don't improve, worsen or new problems develop. The patient verbalized understanding.   Follow-up: Return in about 3 months (around 06/17/2024).   Haze LELON Servant, FNP-BC [facility] and [facility] [place], [place] [phone]   03/18/2024, 1:02 AM

## 2024-03-18 ENCOUNTER — Encounter: Payer: Self-pay | Admitting: Nurse Practitioner

## 2024-03-18 LAB — CMP14+EGFR
ALT: 57 IU/L — ABNORMAL HIGH (ref 0–44)
AST: 200 IU/L — ABNORMAL HIGH (ref 0–40)
Albumin: 3.6 g/dL — ABNORMAL LOW (ref 3.8–4.9)
Alkaline Phosphatase: 167 IU/L — ABNORMAL HIGH (ref 47–123)
BUN/Creatinine Ratio: 14 (ref 9–20)
BUN: 11 mg/dL (ref 6–24)
Bilirubin Total: 1.2 mg/dL (ref 0.0–1.2)
CO2: 20 mmol/L (ref 20–29)
Calcium: 8.4 mg/dL — ABNORMAL LOW (ref 8.7–10.2)
Chloride: 102 mmol/L (ref 96–106)
Creatinine, Ser: 0.81 mg/dL (ref 0.76–1.27)
Globulin, Total: 3.8 g/dL (ref 1.5–4.5)
Glucose: 125 mg/dL — ABNORMAL HIGH (ref 70–99)
Potassium: 3.9 mmol/L (ref 3.5–5.2)
Sodium: 139 mmol/L (ref 134–144)
Total Protein: 7.4 g/dL (ref 6.0–8.5)
eGFR: 105 mL/min/1.73 (ref >59)

## 2024-03-18 LAB — CBC WITH DIFFERENTIAL/PLATELET
Basophils Absolute: 0 x10E3/uL (ref 0.0–0.2)
Basos: 1 %
EOS (ABSOLUTE): 0 x10E3/uL (ref 0.0–0.4)
Eos: 1 %
Hematocrit: 29.5 % — ABNORMAL LOW (ref 37.5–51.0)
Hemoglobin: 8.7 g/dL — ABNORMAL LOW (ref 13.0–17.7)
Immature Grans (Abs): 0 x10E3/uL (ref 0.0–0.1)
Immature Granulocytes: 0 %
Lymphocytes Absolute: 1.8 x10E3/uL (ref 0.7–3.1)
Lymphs: 40 %
MCH: 27 pg (ref 26.6–33.0)
MCHC: 29.5 g/dL — ABNORMAL LOW (ref 31.5–35.7)
MCV: 92 fL (ref 79–97)
Monocytes Absolute: 0.5 x10E3/uL (ref 0.1–0.9)
Monocytes: 10 %
Neutrophils Absolute: 2.1 x10E3/uL (ref 1.4–7.0)
Neutrophils: 48 %
Platelets: 99 x10E3/uL — CL (ref 150–450)
RBC: 3.22 x10E6/uL — ABNORMAL LOW (ref 4.14–5.80)
RDW: 19.6 % — ABNORMAL HIGH (ref 11.6–15.4)
WBC: 4.4 x10E3/uL (ref 3.4–10.8)

## 2024-03-20 ENCOUNTER — Other Ambulatory Visit: Payer: Self-pay

## 2024-03-21 ENCOUNTER — Ambulatory Visit: Payer: Self-pay | Admitting: Nurse Practitioner

## 2024-03-28 ENCOUNTER — Encounter

## 2024-03-29 ENCOUNTER — Other Ambulatory Visit: Payer: Self-pay

## 2024-03-31 ENCOUNTER — Encounter (HOSPITAL_BASED_OUTPATIENT_CLINIC_OR_DEPARTMENT_OTHER): Payer: Self-pay | Admitting: *Deleted

## 2024-03-31 ENCOUNTER — Emergency Department (HOSPITAL_BASED_OUTPATIENT_CLINIC_OR_DEPARTMENT_OTHER)
Admission: EM | Admit: 2024-03-31 | Discharge: 2024-03-31 | Disposition: A | Discharge status: Home / Self Care | Attending: Emergency Medicine | Admitting: Emergency Medicine

## 2024-03-31 ENCOUNTER — Emergency Department (HOSPITAL_BASED_OUTPATIENT_CLINIC_OR_DEPARTMENT_OTHER)

## 2024-03-31 ENCOUNTER — Other Ambulatory Visit (HOSPITAL_BASED_OUTPATIENT_CLINIC_OR_DEPARTMENT_OTHER): Payer: Self-pay

## 2024-03-31 ENCOUNTER — Other Ambulatory Visit: Payer: Self-pay

## 2024-03-31 DIAGNOSIS — R7989 Other specified abnormal findings of blood chemistry: Secondary | ICD-10-CM

## 2024-03-31 DIAGNOSIS — M25562 Pain in left knee: Secondary | ICD-10-CM | POA: Insufficient documentation

## 2024-03-31 DIAGNOSIS — R188 Other ascites: Secondary | ICD-10-CM

## 2024-03-31 DIAGNOSIS — Z5900 Homelessness unspecified: Secondary | ICD-10-CM | POA: Insufficient documentation

## 2024-03-31 DIAGNOSIS — R7401 Elevation of levels of liver transaminase levels: Secondary | ICD-10-CM | POA: Insufficient documentation

## 2024-03-31 DIAGNOSIS — Z79899 Other long term (current) drug therapy: Secondary | ICD-10-CM | POA: Insufficient documentation

## 2024-03-31 DIAGNOSIS — I10 Essential (primary) hypertension: Secondary | ICD-10-CM | POA: Insufficient documentation

## 2024-03-31 DIAGNOSIS — M25561 Pain in right knee: Secondary | ICD-10-CM | POA: Insufficient documentation

## 2024-03-31 DIAGNOSIS — D649 Anemia, unspecified: Secondary | ICD-10-CM | POA: Insufficient documentation

## 2024-03-31 DIAGNOSIS — K7460 Unspecified cirrhosis of liver: Secondary | ICD-10-CM | POA: Insufficient documentation

## 2024-03-31 LAB — CBC WITH DIFFERENTIAL/PLATELET
Abs Immature Granulocytes: 0.02 K/uL (ref 0.00–0.07)
Basophils Absolute: 0 K/uL (ref 0.0–0.1)
Basophils Relative: 0 %
Eosinophils Absolute: 0 K/uL (ref 0.0–0.5)
Eosinophils Relative: 0 %
HCT: 27.1 % — ABNORMAL LOW (ref 39.0–52.0)
Hemoglobin: 8.6 g/dL — ABNORMAL LOW (ref 13.0–17.0)
Immature Granulocytes: 0 %
Lymphocytes Relative: 13 %
Lymphs Abs: 1 K/uL (ref 0.7–4.0)
MCH: 27.2 pg (ref 26.0–34.0)
MCHC: 31.7 g/dL (ref 30.0–36.0)
MCV: 85.8 fL (ref 80.0–100.0)
Monocytes Absolute: 1.4 K/uL — ABNORMAL HIGH (ref 0.1–1.0)
Monocytes Relative: 18 %
Neutro Abs: 5.6 K/uL (ref 1.7–7.7)
Neutrophils Relative %: 69 %
Platelets: 172 K/uL (ref 150–400)
RBC: 3.16 MIL/uL — ABNORMAL LOW (ref 4.22–5.81)
RDW: 20.1 % — ABNORMAL HIGH (ref 11.5–15.5)
WBC: 8 K/uL (ref 4.0–10.5)
nRBC: 0 % (ref 0.0–0.2)

## 2024-03-31 LAB — COMPREHENSIVE METABOLIC PANEL WITH GFR
ALT: 50 U/L — ABNORMAL HIGH (ref 0–44)
AST: 106 U/L — ABNORMAL HIGH (ref 15–41)
Albumin: 3.4 g/dL — ABNORMAL LOW (ref 3.5–5.0)
Alkaline Phosphatase: 152 U/L — ABNORMAL HIGH (ref 38–126)
Anion gap: 16 — ABNORMAL HIGH (ref 5–15)
BUN: 8 mg/dL (ref 6–20)
CO2: 18 mmol/L — ABNORMAL LOW (ref 22–32)
Calcium: 9.5 mg/dL (ref 8.9–10.3)
Chloride: 98 mmol/L (ref 98–111)
Creatinine, Ser: 0.68 mg/dL (ref 0.61–1.24)
GFR, Estimated: >60 mL/min (ref >60)
Glucose, Bld: 96 mg/dL (ref 70–99)
Potassium: 3.9 mmol/L (ref 3.5–5.1)
Sodium: 132 mmol/L — ABNORMAL LOW (ref 135–145)
Total Bilirubin: 1.5 mg/dL — ABNORMAL HIGH (ref 0.0–1.2)
Total Protein: 8.3 g/dL — ABNORMAL HIGH (ref 6.5–8.1)

## 2024-03-31 MED ORDER — PREDNISONE 20 MG PO TABS
40.0000 mg | ORAL_TABLET | Freq: Every day | ORAL | 0 refills | Status: AC
  Filled 2024-03-31 – 2024-04-04 (×4): qty 10, 5d supply, fill #0

## 2024-03-31 NOTE — ED Triage Notes (Signed)
 Triage using french interpreter. Pt is brought in by ems due to bilateral knee pain x3-4 days.  Pt states that he has taken colchicine  and prednisone  but he states that this does not help.  No trauma.  Pt is experiencing homelessness.

## 2024-03-31 NOTE — ED Notes (Signed)
 Provided shelter resources from social work and told patient that taxi voucher would be provided to reach shelter.

## 2024-03-31 NOTE — Discharge Instructions (Addendum)
 It was a pleasure taking care of you today. As discussed, I am sending you home with steroids to help treat your gout. Take as prescribed. Your liver enzymes were elevated and your hemoglobin was low. Your ultrasound showed cirrhosis of your liver. Stop drinking alcohol. I have placed a referral to GI. They will call you to schedule an appointment. Return to the ER for new or worsening symptoms.

## 2024-03-31 NOTE — ED Provider Notes (Signed)
 [facility] EMERGENCY DEPARTMENT AT [facility] Provider Note   CSN: [id number] Arrival date & time: 03/31/24  1210     Patient presents with: Knee Pain   Brandon Arias is a 53 y.o. male with a past medical history significant for hypertension, arthritis, and frequent gout who presents to the ED due to bilateral knee pain x 3 to 4 days.  Notes his right knee is worse than his left.  Currently being treated for gout by PCP with Indomethacin  75mg .  Has frequent gout.  Missed a rheumatology appointment due to language barrier.  PCP placed another referral to rheumatology at his most recent appointment on 03/18/2024.  Currently homeless.  Denies fever and chills.  No direct injury.  Denies penile discharge.  No concern for STDs.  Notes it feels similar to his previous gout flares however, slightly worse.  Typically has gout in his elbows, knees, and feet.  Of note, at patient's most recent PCP appointment his LFTs were elevated and hemoglobin was low.  History obtained from patient and past medical records.  Official French interpreter used during entire encounter.      Prior to Admission medications   Medication Sig Start Date End Date Taking? Authorizing Provider  predniSONE  (DELTASONE ) 20 MG tablet Take 2 tablets (40 mg total) by mouth daily for 5 days. 03/31/24 04/05/24 Yes Larone Kliethermes, Aleck BROCKS, PA-C  amLODipine  (NORVASC ) 10 MG tablet Take 1 tablet (10 mg total) by mouth daily. prendre 1 comprime par voie orale par jour pour la tension arterielle 03/17/24   Fleming, Zelda W, NP  indomethacin  (INDOCIN  SR) 75 MG CR capsule Take 1 capsule (75 mg total) by mouth 2 (two) times daily with a meal. prendre 1 comprime par voie orale par jour pour [employer]. 03/17/24   Fleming, Zelda W, NP  valsartan  (DIOVAN ) 160 MG tablet Take 1 tablet (160 mg total) by mouth daily. prendre 1 comprime par voie orale par jour pour l hypertension arterielle 03/17/24   Fleming, Zelda W, NP  Vitamin D ,  Ergocalciferol , (DRISDOL ) 1.25 MG (50000 UNIT) CAPS capsule Take 1 capsule (50,000 Units total) by mouth every 7 (seven) days. prendre un comprim par semaine en cas de carence en vitamine D 03/17/24   Fleming, Zelda W, NP    Allergies: Patient has no known allergies.    Review of Systems  Constitutional:  Negative for chills and fever.  Genitourinary:  Negative for penile discharge.  Musculoskeletal:  Positive for arthralgias.    Updated Vital Signs BP (!) 138/110 (BP Location: Right Arm)   Pulse 94   Temp 98.9 F (37.2 C) (Oral)   Resp 16   Ht 5' 7 (1.702 m)   Wt 76 kg   SpO2 100%   BMI 26.24 kg/m   Physical Exam Vitals and nursing note reviewed.  Constitutional:      General: He is not in acute distress.    Appearance: He is not ill-appearing.  HENT:     Head: Normocephalic.  Eyes:     Pupils: Pupils are equal, round, and reactive to light.  Cardiovascular:     Rate and Rhythm: Normal rate and regular rhythm.     Pulses: Normal pulses.     Heart sounds: Normal heart sounds. No murmur heard.    No friction rub. No gallop.  Pulmonary:     Effort: Pulmonary effort is normal.     Breath sounds: Normal breath sounds.  Abdominal:     General: Abdomen is flat. There  is no distension.     Palpations: Abdomen is soft.     Tenderness: There is no abdominal tenderness. There is no guarding or rebound.  Musculoskeletal:        General: Normal range of motion.     Cervical back: Neck supple.     Comments: Tenderness throughout right knee with edema.  Able to fully extend however decreased flexion of right knee.  Right lower extremity neurovascularly intact with soft compartments.  No tenderness to left knee.  Very mild edema.  Full range of motion of left knee. LLE neurovascularly intact with soft compartments.  Skin:    General: Skin is warm and dry.  Neurological:     General: No focal deficit present.     Mental Status: He is alert.  Psychiatric:        Mood and  Affect: Mood normal.        Behavior: Behavior normal.     (all labs ordered are listed, but only abnormal results are displayed) Labs Reviewed  CBC WITH DIFFERENTIAL/PLATELET - Abnormal; Notable for the following components:      Result Value   RBC 3.16 (*)    Hemoglobin 8.6 (*)    HCT 27.1 (*)    RDW 20.1 (*)    Monocytes Absolute 1.4 (*)    All other components within normal limits  COMPREHENSIVE METABOLIC PANEL WITH GFR - Abnormal; Notable for the following components:   Sodium 132 (*)    CO2 18 (*)    Total Protein 8.3 (*)    Albumin 3.4 (*)    AST 106 (*)    ALT 50 (*)    Alkaline Phosphatase 152 (*)    Total Bilirubin 1.5 (*)    Anion gap 16 (*)    All other components within normal limits  HEPATITIS PANEL, ACUTE    EKG: None  Radiology: US  Abdomen Limited RUQ (LIVER/GB) Result Date: 03/31/2024 CLINICAL DATA:  Elevated liver function tests EXAM: ULTRASOUND ABDOMEN LIMITED RIGHT UPPER QUADRANT COMPARISON:  06/15/2019, 06/15/2023 FINDINGS: Gallbladder: No gallstones or wall thickening visualized. No sonographic Murphy sign noted by sonographer. Common bile duct: Diameter: 1.4 mm Liver: Coarsened increased liver echotexture and nodularity of the liver capsule compatible with known cirrhosis. No focal parenchymal abnormalities. No intrahepatic duct dilation. Portal vein is patent on color Doppler imaging with normal direction of blood flow towards the liver. Other: Trace free fluid within the hepatic renal fossa. IMPRESSION: 1. Cirrhosis. 2. Trace ascites. 3. No evidence of cholelithiasis or cholecystitis. Electronically Signed   By: Ozell Daring M.D.   On: 03/31/2024 15:14   DG Knee Complete 4 Views Right Result Date: 03/31/2024 CLINICAL DATA:  Bilateral knee pain. EXAM: RIGHT KNEE - COMPLETE 4+ VIEW COMPARISON:  12/21/2023 FINDINGS: No evidence of fracture or dislocation. Moderate joint effusion. No evidence of arthropathy or other focal bone abnormality. Generalized soft  tissue edema. IMPRESSION: Moderate joint effusion and generalized soft tissue edema. No acute osseous abnormality. Electronically Signed   By: Andrea Gasman M.D.   On: 03/31/2024 13:24   DG Knee Complete 4 Views Left Result Date: 03/31/2024 CLINICAL DATA:  Bilateral knee pain. EXAM: LEFT KNEE - COMPLETE 4+ VIEW COMPARISON:  12/21/2023 FINDINGS: No evidence of fracture or dislocation. Small joint effusion. Mild tricompartmental peripheral spurring. No erosive change or focal bone abnormality. Mild generalized soft tissue edema. IMPRESSION: Mild tricompartmental osteoarthritis. Small joint effusion. Electronically Signed   By: Andrea Gasman M.D.   On: 03/31/2024 13:23  Procedures   Medications Ordered in the ED - No data to display  Clinical Course as of 03/31/24 1543  Fri Mar 31, 2024  1419 Hemoglobin(!): 8.6 [CA]  1432 WBC: 8.0 [CA]    Clinical Course User Index [CA] Lorelle Aleck BROCKS, PA-C                                 Medical Decision Making Amount and/or Complexity of Data Reviewed Labs: ordered. Decision-making details documented in ED Course. Radiology: ordered and independent interpretation performed. Decision-making details documented in ED Course.  Risk Prescription drug management.   This patient presents to the ED for concern of knee pain, this involves an extensive number of treatment options, and is a complaint that carries with it a high risk of complications and morbidity.  The differential diagnosis includes gout, arthritis, bony fracture, septic joint, etc  53 year old male presents to the ED due to bilateral knee pain x 3 to 4 days.  Currently being treated for gout by PCP.  Has frequent gout flares in his elbows, knees, and feet.  Missed a recent rheumatology appointment due to language barrier.  Currently homeless.  No fever or chills.  No penile discharge.  Denies concern for STIs.  Upon arrival, stable vitals.  Patient in no acute distress. Tenderness  throughout right knee with edema.  Able to fully extend however decreased flexion of right knee.  Right lower extremity neurovascularly intact with soft compartments.  No tenderness to left knee.  Very mild edema.  Full range of motion of left knee. LLE neurovascularly intact with soft compartments.  Lower suspicion for septic joint however, given slight decreased range of motion of right knee will obtain routine labs.  Added LFTs given transaminitis at his most recent PCP office. X-rays to rule out arthritis vs. Bony fracture.  Low suspicion for disseminated gonorrhea.  CBC with no leukocytosis.  Hemoglobin 8.6 which appears similar to 2 weeks ago.  Patient denies any bleeding.  Low suspicion for GI bleed.  CMP with transaminitis.  Patient denies any chronic alcohol use. Downtrending from previous. Abdomen soft, nondistended, nontender.  No right upper quadrant or epigastric tenderness.  Given patient is currently homeless and has difficulties following up, RUQ US  added. Hepatitis panel. Slight anion gap, likely starvation ketosis. Low suspicion for DKA. Right knee x-ray personally reviewed and interpreted demonstrates a moderate joint effusion and generalized soft tissue edema.  No bony abnormalities.  Left knee with mild tricompartmental osteoarthritis and small joint effusion.   Suspect knee pain likely related to gout given patient's history.  Patient discharged with prednisone .  No history of diabetes.  Low suspicion for septic joint.  Advised patient to follow-up with PCP if symptoms do not improve over the next few days.  RUQ personally reviewed and interpret demonstrates cirrhosis and mild amount of ascites.  No evidence of acute cholecystitis.  GI referral placed.   Suspect knee pain likely related to gout.  Patient discharged with prednisone  x 5 days.  Advised patient to follow-up with PCP symptoms not improved.  Shelter resources given to patient at discharge. Patient stable for discharge. Strict  ED precautions discussed with patient. Patient states understanding and agrees to plan. Patient discharged home in no acute distress and stable vitals  Co morbidities that complicate the patient evaluation  HTN  Social Determinants of Health:  Language barrier- official French interpreter used during entire Location Manager /  Admission - Considered:  Considered admission however, LFTs downtrending from previous.  Low suspicion for septic joint.  Suspect symptoms likely related to gout.     Final diagnoses:  Acute pain of both knees  Elevated LFTs  Low hemoglobin  Cirrhosis of liver with ascites, unspecified hepatic cirrhosis type [facility])    ED Discharge Orders          Ordered    predniSONE  (DELTASONE ) 20 MG tablet  Daily        03/31/24 1437    Ambulatory referral to Gastroenterology        03/31/24 1531               Lorelle Aleck BROCKS, PA-C 03/31/24 1703    Charlyn Sora, MD 04/01/24 [phone]

## 2024-04-01 LAB — HEPATITIS PANEL, ACUTE
HCV Ab: REACTIVE — AB
Hep A IgM: NONREACTIVE
Hep B C IgM: NONREACTIVE
Hepatitis B Surface Ag: NONREACTIVE

## 2024-04-03 ENCOUNTER — Other Ambulatory Visit (HOSPITAL_BASED_OUTPATIENT_CLINIC_OR_DEPARTMENT_OTHER): Payer: Self-pay

## 2024-04-03 ENCOUNTER — Other Ambulatory Visit (HOSPITAL_COMMUNITY): Payer: Self-pay

## 2024-04-04 ENCOUNTER — Other Ambulatory Visit (HOSPITAL_COMMUNITY): Payer: Self-pay

## 2024-04-04 ENCOUNTER — Other Ambulatory Visit: Payer: Self-pay

## 2024-04-06 ENCOUNTER — Ambulatory Visit (HOSPITAL_COMMUNITY): Payer: Self-pay

## 2024-04-11 ENCOUNTER — Encounter

## 2024-04-12 ENCOUNTER — Encounter: Admitting: Internal Medicine

## 2024-04-27 DEATH — deceased

## 2024-06-19 ENCOUNTER — Ambulatory Visit: Admitting: Nurse Practitioner

## 2024-07-11 ENCOUNTER — Ambulatory Visit
